# Patient Record
Sex: Female | Born: 1950 | Race: Black or African American | Hispanic: No | Marital: Single | Smoking: Former smoker
Health system: Southern US, Community
[De-identification: ages and names within clinical notes are randomized; demographics above are authoritative.]

## PROBLEM LIST (undated history)

## (undated) DIAGNOSIS — Z923 Personal history of irradiation: Secondary | ICD-10-CM

## (undated) DIAGNOSIS — R011 Cardiac murmur, unspecified: Secondary | ICD-10-CM

## (undated) DIAGNOSIS — I10 Essential (primary) hypertension: Secondary | ICD-10-CM

## (undated) DIAGNOSIS — M1990 Unspecified osteoarthritis, unspecified site: Secondary | ICD-10-CM

## (undated) DIAGNOSIS — I82611 Acute embolism and thrombosis of superficial veins of right upper extremity: Secondary | ICD-10-CM

## (undated) DIAGNOSIS — I7121 Aneurysm of the ascending aorta, without rupture: Secondary | ICD-10-CM

## (undated) DIAGNOSIS — M8580 Other specified disorders of bone density and structure, unspecified site: Secondary | ICD-10-CM

## (undated) DIAGNOSIS — I499 Cardiac arrhythmia, unspecified: Secondary | ICD-10-CM

## (undated) DIAGNOSIS — E782 Mixed hyperlipidemia: Secondary | ICD-10-CM

## (undated) DIAGNOSIS — M160 Bilateral primary osteoarthritis of hip: Secondary | ICD-10-CM

## (undated) HISTORY — PX: KNEE ARTHROSCOPY: SUR90

## (undated) HISTORY — PX: COLONOSCOPY: SHX174

## (undated) HISTORY — PX: CARPAL TUNNEL RELEASE: SHX101

## (undated) HISTORY — PX: ABDOMINAL HYSTERECTOMY: SHX81

---

## 2010-01-13 ENCOUNTER — Ambulatory Visit: Payer: Self-pay | Admitting: Endocrinology

## 2010-01-19 ENCOUNTER — Ambulatory Visit: Payer: Self-pay | Admitting: Gastroenterology

## 2010-12-26 ENCOUNTER — Ambulatory Visit: Payer: Self-pay | Admitting: Unknown Physician Specialty

## 2010-12-27 LAB — PATHOLOGY REPORT

## 2011-09-25 ENCOUNTER — Ambulatory Visit: Payer: Self-pay | Admitting: Endocrinology

## 2011-10-05 ENCOUNTER — Ambulatory Visit: Payer: Self-pay | Admitting: Endocrinology

## 2013-01-16 ENCOUNTER — Ambulatory Visit: Payer: Self-pay | Admitting: Family Medicine

## 2013-01-23 ENCOUNTER — Ambulatory Visit: Payer: Self-pay | Admitting: Family Medicine

## 2013-03-03 HISTORY — PX: BREAST BIOPSY: SHX20

## 2013-03-05 ENCOUNTER — Ambulatory Visit: Payer: Self-pay | Admitting: Surgery

## 2014-03-26 ENCOUNTER — Ambulatory Visit: Payer: Self-pay | Admitting: Family Medicine

## 2014-04-28 ENCOUNTER — Ambulatory Visit: Payer: BC Managed Care – PPO | Admitting: Podiatry

## 2014-04-30 ENCOUNTER — Ambulatory Visit (INDEPENDENT_AMBULATORY_CARE_PROVIDER_SITE_OTHER): Payer: BC Managed Care – PPO

## 2014-04-30 ENCOUNTER — Encounter: Payer: Self-pay | Admitting: Podiatry

## 2014-04-30 ENCOUNTER — Ambulatory Visit (INDEPENDENT_AMBULATORY_CARE_PROVIDER_SITE_OTHER): Payer: BC Managed Care – PPO | Admitting: Podiatry

## 2014-04-30 VITALS — BP 160/92 | HR 54 | Resp 16 | Ht 68.0 in | Wt 179.0 lb

## 2014-04-30 DIAGNOSIS — L84 Corns and callosities: Secondary | ICD-10-CM

## 2014-04-30 DIAGNOSIS — M2040 Other hammer toe(s) (acquired), unspecified foot: Secondary | ICD-10-CM

## 2014-04-30 DIAGNOSIS — M2010 Hallux valgus (acquired), unspecified foot: Secondary | ICD-10-CM

## 2014-04-30 DIAGNOSIS — G589 Mononeuropathy, unspecified: Secondary | ICD-10-CM

## 2014-04-30 DIAGNOSIS — G629 Polyneuropathy, unspecified: Secondary | ICD-10-CM

## 2014-04-30 NOTE — Progress Notes (Signed)
   Subjective:    Patient ID: Kim Ruiz, female    DOB: [birth year], 63 y.o.   MRN: [id number]  HPI Comments: My toes are numb and i have a callus on both feet. They both tingle and they are tender. This started back in June 2015. They are getting better. Everything makes them tender. i soak my feet in epsom salt and massage them.  Foot Pain      Review of Systems  All other systems reviewed and are negative.      Objective:   Physical Exam        Assessment & Plan:

## 2014-04-30 NOTE — Progress Notes (Signed)
Subjective:     Patient ID: Kim Ruiz, female   DOB: [birth year], 63 y.o.   MRN: [id number]  Foot Pain   patient states I been getting pain in both of my feet and it seems that most has started since I bought a new pair of steel toe shoes at [employer]. Also has developed lesions on the bottom of both feet the become sore and she is just concerned because she had nerve pain even though she states it is getting much better over the last couple months   Review of Systems  All other systems reviewed and are negative.      Objective:   Physical Exam  Nursing note and vitals reviewed. Constitutional: She is oriented to person, place, and time.  Cardiovascular: Intact distal pulses.   Musculoskeletal: Normal range of motion.  Neurological: She is oriented to person, place, and time.  Skin: Skin is warm.   neurovascular status intact with muscle strength adequate and range of motion of the subtalar and midtarsal joint within normal limits. Patient's found to have keratotic lesion on the fifth metatarsal head of both feet and the plantar the fifth metatarsal base both feet and the fifth toe of both feet. I checked her and found sharp [employer] vibratory to be intact with no changes and I noted her to be well oriented x3 with digits that are well-perfused     Assessment:     Possibility that she may have traumatized her feet secondary to ill fitting shoes or other issues associated with a new steel toe shoes that she had purchase    Plan:     H&P and x-rays reviewed. Today I debrided lesions on both feet and explained that it nerve symptoms were to get worse we may need to treat that but at this point I am going to just watch it and see if he gets better with new shoes

## 2014-05-14 ENCOUNTER — Ambulatory Visit (INDEPENDENT_AMBULATORY_CARE_PROVIDER_SITE_OTHER): Payer: BC Managed Care – PPO

## 2014-05-14 ENCOUNTER — Ambulatory Visit (INDEPENDENT_AMBULATORY_CARE_PROVIDER_SITE_OTHER): Payer: BC Managed Care – PPO | Admitting: Podiatry

## 2014-05-14 VITALS — BP 133/66 | HR 55 | Resp 16

## 2014-05-14 DIAGNOSIS — S9031XA Contusion of right foot, initial encounter: Secondary | ICD-10-CM

## 2014-05-14 DIAGNOSIS — S9030XA Contusion of unspecified foot, initial encounter: Secondary | ICD-10-CM

## 2014-05-14 DIAGNOSIS — L02619 Cutaneous abscess of unspecified foot: Secondary | ICD-10-CM

## 2014-05-14 DIAGNOSIS — L03119 Cellulitis of unspecified part of limb: Secondary | ICD-10-CM

## 2014-05-14 MED ORDER — CEPHALEXIN 500 MG PO CAPS: 500.0000 mg | ORAL_CAPSULE | Freq: Three times a day (TID) | ORAL | Status: DC

## 2014-05-14 NOTE — Progress Notes (Signed)
Subjective:     Patient ID: Kim Ruiz, female   DOB: 09/04/1950, 63 y.o.   MRN: [id number]  HPI patient states that a week ago a screen door landed on her right foot and penetrated the top and it has been sore since that   Review of Systems     Objective:   Physical Exam Neurovascular status intact with a small area of breakdown tissue dorsal lateral aspect right foot that is localized with no odor no proximal edema erythema or drainage noted with a small amount of localized drainage and crusting    Assessment:     Trauma to the right dorsal foot with localized breakdown of tissue with no proximal indications of infection    Plan:     H&P and x-rays reviewed and today I cleaned the area up flushed and applied a small amount of Iodosorb to dry it out was sterile dressing. Instructed on soaks and topical antibiotic treatment for the area and as precautionary measure placed her on cephalexin 500 mg 3 times a day for 10 days. Gave instructions if any proximal edema erythema or any systemic signs of infection were to occur she is to go straight to the emergency room and contact us

## 2014-06-22 ENCOUNTER — Ambulatory Visit: Payer: Self-pay | Admitting: Internal Medicine

## 2014-11-23 ENCOUNTER — Ambulatory Visit: Payer: Self-pay | Admitting: Specialist

## 2014-11-23 DIAGNOSIS — R011 Cardiac murmur, unspecified: Secondary | ICD-10-CM

## 2014-11-23 DIAGNOSIS — I10 Essential (primary) hypertension: Secondary | ICD-10-CM | POA: Diagnosis not present

## 2015-01-02 NOTE — Op Note (Signed)
PATIENT NAME:  Kim Ruiz, Kim Ruiz MR#:  [id number] DATE OF BIRTH:  05-29-1951  DATE OF PROCEDURE:  11/23/2014  PREOPERATIVE DIAGNOSIS: Medial meniscus tear, right knee.   POSTOPERATIVE DIAGNOSIS:   Medial meniscus tear, right knee.   PROCEDURE: Arthroscopic partial medial meniscectomy, right knee.   SURGEON:  Christophe Louis, MD   ANESTHESIA: General.   COMPLICATIONS: None.   PROCEDURE: After adequate induction of general anesthesia, the right lower extremity is secured in the legholder in the usual manner for arthroscopy. The right knee and leg are thoroughly prepped with alcohol and ChloraPrep and draped in standard sterile fashion. The joint is infiltrated with 10 mL of Marcaine with epinephrine. Diagnostic arthroscopy is performed. There is seen to be mild chondromalacia of the patellofemoral joint. The lateral compartment is normal with the lateral meniscus which is normal and normal cartilage surfaces.   In the intercondylar notch, there is mild increased synovitis. The anterior cruciate ligament is normal. In the medial compartment, there is a buckle-handle-type tear of the mid and posterior horns of the medial meniscus. This is associated with a moderate amount of chondromalacia associated to that area on the medial femoral condyle. Using the basket forceps, the full radial resector and the ArthroWand, the torn portion of the meniscus was resected back to a full stable rim. The wound was thoroughly irrigated of all debris. Portals were closed with 4-0 nylon. The joint is infiltrated with 15 mL of Marcaine with epinephrine and 4 mg of morphine. A soft bulky dressing is applied. The patient is returned to the recovery room in satisfactory condition having tolerated the procedure quite well.    ____________________________ Lucas Mallow, MD ces:tr D: 11/23/2014 11:28:15 ET T: 11/23/2014 13:18:49 ET JOB#: [id number]  cc: Lucas Mallow, MD, <Dictator> Lucas Mallow  MD ELECTRONICALLY SIGNED 11/27/2014 13:03

## 2015-01-10 ENCOUNTER — Other Ambulatory Visit: Payer: Self-pay | Admitting: Specialist

## 2015-01-10 DIAGNOSIS — S83221D Peripheral tear of medial meniscus, current injury, right knee, subsequent encounter: Secondary | ICD-10-CM

## 2015-01-15 ENCOUNTER — Ambulatory Visit
Admission: RE | Admit: 2015-01-15 | Discharge: 2015-01-15 | Disposition: A | Discharge status: Home / Self Care | Payer: BLUE CROSS/BLUE SHIELD | Source: Ambulatory Visit | Attending: Specialist | Admitting: Specialist

## 2015-01-15 DIAGNOSIS — X58XXXA Exposure to other specified factors, initial encounter: Secondary | ICD-10-CM | POA: Insufficient documentation

## 2015-01-15 DIAGNOSIS — S83241A Other tear of medial meniscus, current injury, right knee, initial encounter: Secondary | ICD-10-CM | POA: Insufficient documentation

## 2015-01-15 DIAGNOSIS — M25461 Effusion, right knee: Secondary | ICD-10-CM | POA: Insufficient documentation

## 2015-01-15 DIAGNOSIS — M1711 Unilateral primary osteoarthritis, right knee: Secondary | ICD-10-CM | POA: Diagnosis not present

## 2015-01-15 DIAGNOSIS — S83221D Peripheral tear of medial meniscus, current injury, right knee, subsequent encounter: Secondary | ICD-10-CM

## 2015-01-28 ENCOUNTER — Encounter: Payer: Self-pay | Admitting: *Deleted

## 2015-01-28 ENCOUNTER — Encounter: Payer: Self-pay | Admitting: Podiatry

## 2015-01-28 ENCOUNTER — Other Ambulatory Visit: Payer: Self-pay

## 2015-01-28 DIAGNOSIS — S83241A Other tear of medial meniscus, current injury, right knee, initial encounter: Secondary | ICD-10-CM | POA: Diagnosis not present

## 2015-01-28 DIAGNOSIS — M67261 Synovial hypertrophy, not elsewhere classified, right lower leg: Secondary | ICD-10-CM | POA: Diagnosis present

## 2015-01-28 DIAGNOSIS — Z79899 Other long term (current) drug therapy: Secondary | ICD-10-CM | POA: Diagnosis not present

## 2015-01-28 DIAGNOSIS — Z79891 Long term (current) use of opiate analgesic: Secondary | ICD-10-CM | POA: Diagnosis not present

## 2015-01-28 MED ORDER — BUPIVACAINE-EPINEPHRINE (PF) 0.5% -1:200000 IJ SOLN
INTRAMUSCULAR | Status: AC
  Filled 2015-01-28: qty 30

## 2015-01-28 NOTE — Patient Instructions (Signed)
  Your procedure is scheduled on: 02-01-15 Report to [facility] To find out your arrival time please call [phone] between 1PM - 3PM on 01-28-15  Remember: Instructions that are not followed completely may result in serious medical risk, up to and including death, or upon the discretion of your surgeon and anesthesiologist your surgery may need to be rescheduled.    _X___ 1. Do not eat food or drink liquids after midnight. No gum chewing or hard candies.     _X___ 2. No Alcohol for 24 hours before or after surgery.   ____ 3. Bring all medications with you on the day of surgery if instructed.    ____ 4. Notify your doctor if there is any change in your medical condition     (cold, fever, infections).     Do not wear jewelry, make-up, hairpins, clips or nail polish.  Do not wear lotions, powders, or perfumes. You may wear deodorant.  Do not shave 48 hours prior to surgery. Men may shave face and neck.  Do not bring valuables to the hospital.    [facility] is not responsible for any belongings or valuables.               Contacts, dentures or bridgework may not be worn into surgery.  Leave your suitcase in the car. After surgery it may be brought to your room.  For patients admitted to the hospital, discharge time is determined by your  treatment team.   Patients discharged the day of surgery will not be allowed to drive home.   Please read over the following fact sheets that you were given:      ____ Take these medicines the morning of surgery with A SIP OF WATER:    1. NONE  2.   3.   4.  5.  6.  ____ Fleet Enema (as directed)   ____ Use CHG Soap as directed  ____ Use inhalers on the day of surgery  ____ Stop metformin 2 days prior to surgery    ____ Take 1/2 of usual insulin dose the night before surgery and none on the morning of surgery.   ____ Stop Coumadin/Plavix/aspirin N/A  ____ Stop Anti-inflammatories-STOP MELOXICAM AND  ALEVE NOW-NO NSAIDS OR ASA PRODUCTS-TYLENOL OK   ____ Stop supplements until after surgery.    ____ Bring C-Pap to the hospital.

## 2015-02-01 ENCOUNTER — Ambulatory Visit: Payer: BLUE CROSS/BLUE SHIELD | Admitting: Certified Registered"

## 2015-02-01 ENCOUNTER — Encounter: Payer: Self-pay | Admitting: *Deleted

## 2015-02-01 ENCOUNTER — Encounter
Admission: RE | Disposition: A | Discharge status: Home / Self Care | Payer: Self-pay | Source: Ambulatory Visit | Attending: Specialist

## 2015-02-01 ENCOUNTER — Ambulatory Visit
Admission: RE | Admit: 2015-02-01 | Discharge: 2015-02-01 | Disposition: A | Discharge status: Home / Self Care | Payer: BLUE CROSS/BLUE SHIELD | Source: Ambulatory Visit | Attending: Specialist | Admitting: Specialist

## 2015-02-01 DIAGNOSIS — M67261 Synovial hypertrophy, not elsewhere classified, right lower leg: Secondary | ICD-10-CM | POA: Insufficient documentation

## 2015-02-01 DIAGNOSIS — Z79899 Other long term (current) drug therapy: Secondary | ICD-10-CM | POA: Insufficient documentation

## 2015-02-01 DIAGNOSIS — S83241A Other tear of medial meniscus, current injury, right knee, initial encounter: Secondary | ICD-10-CM | POA: Diagnosis not present

## 2015-02-01 DIAGNOSIS — Z79891 Long term (current) use of opiate analgesic: Secondary | ICD-10-CM | POA: Insufficient documentation

## 2015-02-01 HISTORY — PX: KNEE ARTHROSCOPY WITH MEDIAL MENISECTOMY: SHX5651

## 2015-02-01 HISTORY — DX: Cardiac arrhythmia, unspecified: I49.9

## 2015-02-01 HISTORY — DX: Essential (primary) hypertension: I10

## 2015-02-01 HISTORY — DX: Cardiac murmur, unspecified: R01.1

## 2015-02-01 LAB — POTASSIUM: Potassium, serum: 3.8

## 2015-02-01 SURGERY — ARTHROSCOPY, KNEE, WITH MEDIAL MENISCECTOMY
Anesthesia: General | Site: Knee | Laterality: Right | Wound class: Clean

## 2015-02-01 MED ORDER — FENTANYL CITRATE (PF) 100 MCG/2ML IJ SOLN
25.0000 ug | INTRAMUSCULAR | Status: DC | PRN
  Administered 2015-02-01 (×2): 25 ug via INTRAVENOUS

## 2015-02-01 MED ORDER — HYDROMORPHONE HCL 2 MG PO TABS
1.0000 mg | ORAL_TABLET | ORAL | Status: DC | PRN
  Administered 2015-02-01: 2 mg via ORAL
  Filled 2015-02-01: qty 1

## 2015-02-01 MED ORDER — LACTATED RINGERS IR SOLN
Status: DC | PRN
  Administered 2015-02-01: 12000 mL

## 2015-02-01 MED ORDER — HYDROMORPHONE HCL 2 MG PO TABS
ORAL_TABLET | ORAL | Status: AC
  Administered 2015-02-01: 2 mg via ORAL
  Filled 2015-02-01: qty 1

## 2015-02-01 MED ORDER — LIDOCAINE HCL (CARDIAC) 20 MG/ML IV SOLN
INTRAVENOUS | Status: DC | PRN
  Administered 2015-02-01: 80 mg via INTRAVENOUS

## 2015-02-01 MED ORDER — METHYLPREDNISOLONE ACETATE 40 MG/ML IJ SUSP
INTRAMUSCULAR | Status: AC
  Filled 2015-02-01: qty 1

## 2015-02-01 MED ORDER — CEFAZOLIN SODIUM-DEXTROSE 2-3 GM-% IV SOLR
2.0000 g | Freq: Once | INTRAVENOUS | Status: AC
  Administered 2015-02-01: 2 g via INTRAVENOUS

## 2015-02-01 MED ORDER — METHYLPREDNISOLONE ACETATE 40 MG/ML IJ SUSP
INTRAMUSCULAR | Status: DC | PRN
  Administered 2015-02-01: 40 mg

## 2015-02-01 MED ORDER — DEXAMETHASONE SODIUM PHOSPHATE 4 MG/ML IJ SOLN
INTRAMUSCULAR | Status: DC | PRN
  Administered 2015-02-01: 5 mg via INTRAVENOUS

## 2015-02-01 MED ORDER — HYDROMORPHONE HCL 2 MG PO TABS
2.0000 mg | ORAL_TABLET | ORAL | Status: DC | PRN
Start: 2015-02-01 — End: 2023-06-25

## 2015-02-01 MED ORDER — MIDAZOLAM HCL 2 MG/2ML IJ SOLN
INTRAMUSCULAR | Status: DC | PRN
  Administered 2015-02-01: 2 mg via INTRAVENOUS

## 2015-02-01 MED ORDER — FENTANYL CITRATE (PF) 100 MCG/2ML IJ SOLN
INTRAMUSCULAR | Status: AC
  Filled 2015-02-01: qty 2

## 2015-02-01 MED ORDER — BUPIVACAINE-EPINEPHRINE (PF) 0.5% -1:200000 IJ SOLN
INTRAMUSCULAR | Status: AC
  Filled 2015-02-01: qty 30

## 2015-02-01 MED ORDER — MORPHINE SULFATE (PF) 4 MG/ML IV SOLN
INTRAVENOUS | Status: DC | PRN
  Administered 2015-02-01: 4 mg via SURGICAL_CAVITY

## 2015-02-01 MED ORDER — FAMOTIDINE 20 MG PO TABS
20.0000 mg | ORAL_TABLET | Freq: Once | ORAL | Status: AC
  Administered 2015-02-01: 20 mg via ORAL

## 2015-02-01 MED ORDER — MORPHINE SULFATE 4 MG/ML IJ SOLN
INTRAMUSCULAR | Status: AC
  Filled 2015-02-01: qty 1

## 2015-02-01 MED ORDER — CEFAZOLIN SODIUM-DEXTROSE 2-3 GM-% IV SOLR
INTRAVENOUS | Status: AC
  Filled 2015-02-01: qty 50

## 2015-02-01 MED ORDER — ONDANSETRON HCL 4 MG/2ML IJ SOLN
INTRAMUSCULAR | Status: DC | PRN
  Administered 2015-02-01: 4 mg via INTRAVENOUS

## 2015-02-01 MED ORDER — FAMOTIDINE 20 MG PO TABS
ORAL_TABLET | ORAL | Status: AC
  Filled 2015-02-01: qty 1

## 2015-02-01 MED ORDER — HYDRALAZINE HCL 20 MG/ML IJ SOLN
INTRAMUSCULAR | Status: AC
  Filled 2015-02-01: qty 1

## 2015-02-01 MED ORDER — ONDANSETRON HCL 4 MG/2ML IJ SOLN
4.0000 mg | Freq: Once | INTRAMUSCULAR | Status: DC | PRN
Start: 2015-02-01 — End: 2015-02-01

## 2015-02-01 MED ORDER — LACTATED RINGERS IV SOLN
INTRAVENOUS | Status: DC | PRN
  Administered 2015-02-01 (×2): via INTRAVENOUS

## 2015-02-01 MED ORDER — GLYCOPYRROLATE 0.2 MG/ML IJ SOLN
INTRAMUSCULAR | Status: DC | PRN
  Administered 2015-02-01: 0.2 mg via INTRAVENOUS

## 2015-02-01 MED ORDER — CHLORHEXIDINE GLUCONATE 4 % EX LIQD: Freq: Once | CUTANEOUS | Status: DC

## 2015-02-01 MED ORDER — PROPOFOL 10 MG/ML IV BOLUS
INTRAVENOUS | Status: DC | PRN
  Administered 2015-02-01: 160 mg via INTRAVENOUS
  Administered 2015-02-01: 50 mg via INTRAVENOUS
  Administered 2015-02-01: 40 mg via INTRAVENOUS

## 2015-02-01 MED ORDER — LABETALOL HCL 5 MG/ML IV SOLN
INTRAVENOUS | Status: DC | PRN
  Administered 2015-02-01: 5 mg via INTRAVENOUS

## 2015-02-01 MED ORDER — LACTATED RINGERS IV SOLN
Freq: Once | INTRAVENOUS | Status: AC
  Administered 2015-02-01: 06:00:00 via INTRAVENOUS

## 2015-02-01 MED ORDER — EPHEDRINE SULFATE 50 MG/ML IJ SOLN
INTRAMUSCULAR | Status: DC | PRN
  Administered 2015-02-01: 10 mg via INTRAVENOUS

## 2015-02-01 MED ORDER — BUPIVACAINE-EPINEPHRINE (PF) 0.5% -1:200000 IJ SOLN
INTRAMUSCULAR | Status: DC | PRN
  Administered 2015-02-01: 30 mL

## 2015-02-01 MED ORDER — FENTANYL CITRATE (PF) 100 MCG/2ML IJ SOLN
INTRAMUSCULAR | Status: DC | PRN
  Administered 2015-02-01 (×4): 50 ug via INTRAVENOUS

## 2015-02-01 MED ORDER — PHENYLEPHRINE HCL 10 MG/ML IJ SOLN
INTRAMUSCULAR | Status: DC | PRN
  Administered 2015-02-01: 100 ug via INTRAVENOUS

## 2015-02-01 MED FILL — Morphine Sulfate Inj 4 MG/ML: INTRAMUSCULAR | Qty: 1 | Status: AC

## 2015-02-01 SURGICAL SUPPLY — 18 items
BANDAGE ELASTIC 6 CLIP NS LF (GAUZE/BANDAGES/DRESSINGS) ×2 IMPLANT
BLADE AGGRESSIVE PLUS 4.0 (BLADE) ×2 IMPLANT
BUR RADIUS 3.5 (BURR) ×2 IMPLANT
CHLORAPREP W/TINT 26ML (MISCELLANEOUS) ×2 IMPLANT
GAUZE SPONGE 4X4 12PLY STRL (GAUZE/BANDAGES/DRESSINGS) ×2 IMPLANT
GLOVE BIO SURGEON STRL SZ7.5 (GLOVE) ×2 IMPLANT
GOWN STRL REUS W/ TWL LRG LVL3 (GOWN DISPOSABLE) ×2 IMPLANT
GOWN STRL REUS W/TWL LRG LVL3 (GOWN DISPOSABLE) ×2
IV LACTATED RINGER IRRG 3000ML (IV SOLUTION) ×6
IV LR IRRIG 3000ML ARTHROMATIC (IV SOLUTION) ×6 IMPLANT
MANIFOLD NEPTUNE II (INSTRUMENTS) ×2 IMPLANT
PACK ARTHROSCOPY KNEE (MISCELLANEOUS) ×2 IMPLANT
SET TUBE SUCT SHAVER OUTFL 24K (TUBING) ×2 IMPLANT
SET TUBE TIP INTRA-ARTICULAR (MISCELLANEOUS) ×2 IMPLANT
STRAP SAFETY BODY (MISCELLANEOUS) ×2 IMPLANT
SUT ETHILON 5-0 FS-2 18 BLK (SUTURE) ×2 IMPLANT
TUBING ARTHRO INFLOW-ONLY STRL (TUBING) ×2 IMPLANT
WAND HAND CNTRL MULTIVAC 50 (MISCELLANEOUS) ×2 IMPLANT

## 2015-02-01 NOTE — Anesthesia Preprocedure Evaluation (Signed)
Anesthesia Evaluation  Patient identified by MRN, date of birth, ID band Patient awake    Reviewed: Allergy & Precautions, NPO status , Patient's Chart, lab work & pertinent test results  Airway Mallampati: II  TM Distance: >3 FB Neck ROM: Full    Dental  (+) Upper Dentures, Lower Dentures   Pulmonary former smoker,    Pulmonary exam normal       Cardiovascular hypertension, Normal cardiovascular exam+ dysrhythmias + Valvular Problems/Murmurs     Neuro/Psych negative neurological ROS  negative psych ROS   GI/Hepatic negative GI ROS, Neg liver ROS,   Endo/Other  negative endocrine ROS  Renal/GU negative Renal ROS  negative genitourinary   Musculoskeletal negative musculoskeletal ROS (+)   Abdominal Normal abdominal exam  (+)   Peds  Hematology negative hematology ROS (+)   Anesthesia Other Findings   Reproductive/Obstetrics negative OB ROS                             Anesthesia Physical Anesthesia Plan  ASA: II  Anesthesia Plan: General   Post-op Pain Management:    Induction: Intravenous  Airway Management Planned: LMA  Additional Equipment:   Intra-op Plan:   Post-operative Plan: Extubation in OR  Informed Consent: I have reviewed the patients History and Physical, chart, labs and discussed the procedure including the risks, benefits and alternatives for the proposed anesthesia with the patient or authorized representative who has indicated his/her understanding and acceptance.   Dental advisory given  Plan Discussed with: CRNA and Surgeon  Anesthesia Plan Comments:         Anesthesia Quick Evaluation

## 2015-02-01 NOTE — Anesthesia Postprocedure Evaluation (Signed)
  Anesthesia Post-op Note  Patient: Kim Ruiz  Procedure(s) Performed: Procedure(s): KNEE ARTHROSCOPY WITH MEDIAL MENISECTOMY (Right)  Anesthesia type:General  Patient location: PACU  Post pain: Pain level controlled  Post assessment: Post-op Vital signs reviewed, Patient's Cardiovascular Status Stable, Respiratory Function Stable, Patent Airway and No signs of Nausea or vomiting  Post vital signs: Reviewed and stable  Last Vitals:  Filed Vitals:   02/01/15 1100  BP: 140/76  Pulse: 57  Temp: 36.3 C  Resp: 16    Level of consciousness: awake, alert  and patient cooperative  Complications: No apparent anesthesia complications

## 2015-02-01 NOTE — H&P (Signed)
  64 year old female with possible recurrent medial meniscus tear right knee.  Heart and lungs clear  ENT normal  Complete H and P as per attached document from office in chart  Plan: arthroscopic debridement right knee.

## 2015-02-01 NOTE — Brief Op Note (Signed)
02/01/2015  9:04 AM  PATIENT:  Kim Ruiz  64 y.o. 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   Delay start of Pharmacological VTE agent (>24hrs) due to surgical blood loss or risk of bleeding: not applicable

## 2015-02-01 NOTE — Transfer of Care (Signed)
Immediate Anesthesia Transfer of Care Note  Patient: Kim Ruiz  Procedure(s) Performed: Procedure(s): KNEE ARTHROSCOPY WITH MEDIAL MENISECTOMY (Right)  Patient Location: PACU  Anesthesia Type:General  Level of Consciousness: awake, alert  and oriented  Airway & Oxygen Therapy: Patient Spontanous Breathing and Patient connected to face mask oxygen  Post-op Assessment: Report given to RN, Post -op Vital signs reviewed and stable and Patient moving all extremities X 4  Post vital signs: Reviewed and stable  Last Vitals:  Filed Vitals:   02/01/15 0905  BP: 108/73  Pulse: 65  Temp: 36.5 C  Resp: 13    Complications: No apparent anesthesia complications

## 2015-02-01 NOTE — Discharge Instructions (Signed)
Partial weight bearing with crutches  Begin bending knee immediately as far as possible despite pain, and straighten it out completely  Remove entire dressing in 24 hours, may bathe, get wet, cover wounds with Bandaids   AMBULATORY SURGERY   DISCHARGE INSTRUCTIONS   1) The drugs that you were given will stay in your system until tomorrow so for the next 24 hours you should not:  A) Drive an automobile B) Make any legal decisions C) Drink any alcoholic beverage   2) You may resume regular meals tomorrow.  Today it is better to start with liquids and gradually work up to solid foods.  You may eat anything you prefer, but it is better to start with liquids, then soup and crackers, and gradually work up to solid foods.   3) Please notify your doctor immediately if you have any unusual bleeding, trouble breathing, redness and pain at the surgery site, drainage, fever, or pain not relieved by medication.                 4) Additional Instructions:   [facility] Number [phone]

## 2015-02-01 NOTE — Op Note (Signed)
NAMEMarland Kitchen  LEVETTE, PAULICK NO.:  [id number]  MEDICAL RECORD NO.:  [id number]  LOCATION:  PERIO                        FACILITY:  [facility]  PHYSICIAN:  Margaretmary Eddy, MD        DATE OF BIRTH:  1951/03/01  DATE OF PROCEDURE:  02/01/2015 DATE OF DISCHARGE:                              OPERATIVE REPORT   PREOPERATIVE DIAGNOSIS:  Recurrent medial meniscus tear, right knee.  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  PROCEDURE:  Arthroscopic major synovectomy, right knee.  ANESTHESIA:  General.  COMPLICATIONS:  None.  DESCRIPTION OF PROCEDURE:  Ancef 2 g is given intravenously prior to the procedure.  General anesthesia is induced.  The right lower extremity is placed in a leg holder in the usual manner for arthroscopy.  The joint infiltrated first with 0.5% Marcaine with epinephrine.  Diagnostic arthroscopy is performed.  There is seen to be severe arthrofibrosis with scar tissue about the entire knee and especially in the suprapatellar pouch.  There is no evidence of infection.  Manipulation prior to the procedure demonstrates 5 degrees to approximately 75 degrees.  The arthroscope is inserted, along with the full radial resector and the ArthroWand and scar tissue is completely resected from the suprapatellar pouch and the gutters on each side and also the intracondylar notch.  The ArthroWand is used to maintain complete hemostasis at all times and at the finish of the procedure.  The lateral meniscus is normal.  The medial compartment demonstrates degenerative chondromalacia on the articular surfaces, but there is no residual meniscus tear.  The joint is thoroughly irrigated multiple times.  At the end of the procedure, the portals are closed with 5-0 nylon.  The joint is infiltrated with 10 cc of Marcaine with epinephrine, 4 mg of morphine and 40 mg of Depo-Medrol.  At the close of the procedure, the  knee can only be manipulated into full extension and down to 95 degrees of flexion.  A soft bulky dressing is applied.  The patient is returned to the recovery room in satisfactory condition, having tolerated the procedure quite well.          ______________________________ Margaretmary Eddy, MD     CS/MEDQ  D:  02/01/2015  T:  02/01/2015  Job:  [id number]

## 2015-02-01 NOTE — Anesthesia Procedure Notes (Signed)
Procedure Name: LMA Insertion Date/Time: 02/01/2015 7:40 AM Performed by: Silvana Newness Pre-anesthesia Checklist: Patient identified, Emergency Drugs available, Suction available, Patient being monitored and Timeout performed Patient Re-evaluated:Patient Re-evaluated prior to inductionOxygen Delivery Method: Circle system utilized Preoxygenation: Pre-oxygenation with 100% oxygen Intubation Type: IV induction Ventilation: Mask ventilation without difficulty LMA: LMA inserted LMA Size: 3.5 Number of attempts: 1 Tube secured with: Tape Dental Injury: Teeth and Oropharynx as per pre-operative assessment

## 2015-03-01 ENCOUNTER — Ambulatory Visit: Payer: BLUE CROSS/BLUE SHIELD | Admitting: Anesthesiology

## 2015-03-01 ENCOUNTER — Encounter: Payer: Self-pay | Admitting: *Deleted

## 2015-03-01 ENCOUNTER — Encounter
Admission: RE | Disposition: A | Discharge status: Home / Self Care | Payer: Self-pay | Source: Ambulatory Visit | Attending: Gastroenterology

## 2015-03-01 ENCOUNTER — Ambulatory Visit
Admission: RE | Admit: 2015-03-01 | Discharge: 2015-03-01 | Disposition: A | Discharge status: Home / Self Care | Payer: BLUE CROSS/BLUE SHIELD | Source: Ambulatory Visit | Attending: Gastroenterology | Admitting: Gastroenterology

## 2015-03-01 DIAGNOSIS — Z87891 Personal history of nicotine dependence: Secondary | ICD-10-CM | POA: Insufficient documentation

## 2015-03-01 DIAGNOSIS — R011 Cardiac murmur, unspecified: Secondary | ICD-10-CM | POA: Diagnosis not present

## 2015-03-01 DIAGNOSIS — Z1211 Encounter for screening for malignant neoplasm of colon: Secondary | ICD-10-CM | POA: Insufficient documentation

## 2015-03-01 DIAGNOSIS — D125 Benign neoplasm of sigmoid colon: Secondary | ICD-10-CM

## 2015-03-01 DIAGNOSIS — I10 Essential (primary) hypertension: Secondary | ICD-10-CM | POA: Diagnosis not present

## 2015-03-01 DIAGNOSIS — K641 Second degree hemorrhoids: Secondary | ICD-10-CM | POA: Diagnosis not present

## 2015-03-01 DIAGNOSIS — D122 Benign neoplasm of ascending colon: Secondary | ICD-10-CM | POA: Diagnosis not present

## 2015-03-01 DIAGNOSIS — Z79899 Other long term (current) drug therapy: Secondary | ICD-10-CM | POA: Diagnosis not present

## 2015-03-01 HISTORY — PX: COLONOSCOPY WITH PROPOFOL: SHX5780

## 2015-03-01 SURGERY — COLONOSCOPY WITH PROPOFOL
Anesthesia: General

## 2015-03-01 MED ORDER — LACTATED RINGERS IV SOLN
INTRAVENOUS | Status: DC | PRN
  Administered 2015-03-01: 11:00:00 via INTRAVENOUS

## 2015-03-01 MED ORDER — FENTANYL CITRATE (PF) 100 MCG/2ML IJ SOLN
INTRAMUSCULAR | Status: DC | PRN
  Administered 2015-03-01: 50 ug via INTRAVENOUS

## 2015-03-01 MED ORDER — MIDAZOLAM HCL 2 MG/2ML IJ SOLN
INTRAMUSCULAR | Status: DC | PRN
Start: 2015-03-01 — End: 2015-03-01
  Administered 2015-03-01: 1 mg via INTRAVENOUS

## 2015-03-01 MED ORDER — ONDANSETRON HCL 4 MG/2ML IJ SOLN: 4.0000 mg | Freq: Once | INTRAMUSCULAR | Status: DC | PRN

## 2015-03-01 MED ORDER — SODIUM CHLORIDE 0.9 % IR SOLN
1000.0000 mL | Freq: Once | Status: AC
  Administered 2015-03-01: 1000 mL

## 2015-03-01 MED ORDER — PROPOFOL INFUSION 10 MG/ML OPTIME
INTRAVENOUS | Status: DC | PRN
  Administered 2015-03-01: 150 ug/kg/min via INTRAVENOUS

## 2015-03-01 MED ORDER — FENTANYL CITRATE (PF) 100 MCG/2ML IJ SOLN: 25.0000 ug | INTRAMUSCULAR | Status: DC | PRN

## 2015-03-01 NOTE — Op Note (Signed)
[facility] Patient Name: Kim Ruiz Procedure Date: 03/01/2015 10:35 AM MRN: [id number] Account #: [id number] Date of Birth: 06-13-1951 Admit Type: Outpatient Age: 64 Room: [facility] ENDO ROOM 4 Gender: Female Note Status: Finalized Procedure:         Colonoscopy Indications:       Screening for colorectal malignant neoplasm Providers:         Lucilla Lame, MD Referring MD:      Raye Sorrow, MD (Referring MD) Medicines:         Propofol per Anesthesia Complications:     No immediate complications. Procedure:         Pre-Anesthesia Assessment:                    - Prior to the procedure, a History and Physical was                     performed, and patient medications and allergies were                     reviewed. The patient's tolerance of previous anesthesia                     was also reviewed. The risks and benefits of the procedure                     and the sedation options and risks were discussed with the                     patient. All questions were answered, and informed consent                     was obtained. Prior Anticoagulants: The patient has taken                     no previous anticoagulant or antiplatelet agents. ASA                     Grade Assessment: II - A patient with mild systemic                     disease. After reviewing the risks and benefits, the                     patient was deemed in satisfactory condition to undergo                     the procedure.                    After obtaining informed consent, the colonoscope was                     passed under direct vision. Throughout the procedure, the                     patient's blood pressure, pulse, and oxygen saturations                     were monitored continuously. The Olympus CF-Q160AL                     colonoscope (S#. [id number]) was introduced through the anus  and advanced to the the cecum, identified by appendiceal              orifice and ileocecal valve. The colonoscopy was performed                     without difficulty. The patient tolerated the procedure                     well. The quality of the bowel preparation was excellent. Findings:      The perianal and digital rectal examinations were normal.      A 5 mm polyp was found in the sigmoid colon. The polyp was sessile. The       polyp was removed with a cold biopsy forceps. Resection and retrieval       were complete.      A 4 mm polyp was found in the ascending colon. The polyp was sessile.       The polyp was removed with a cold biopsy forceps. Resection and       retrieval were complete.      Non-bleeding internal hemorrhoids were found during retroflexion. The       hemorrhoids were Grade II (internal hemorrhoids that prolapse but reduce       spontaneously). Impression:        - One 5 mm polyp in the sigmoid colon. Resected and                     retrieved.                    - One 4 mm polyp in the ascending colon. Resected and                     retrieved.                    - Non-bleeding internal hemorrhoids. Recommendation:    - Await pathology results.                    - Repeat colonoscopy in 5 years if polyp adenoma and 10                     years if hyperplastic Procedure Code(s): --- Professional ---                    [phone], Colonoscopy, flexible; with biopsy, single or                     multiple Diagnosis Code(s): --- Professional ---                    Z12.11, Encounter for screening for malignant neoplasm of                     colon                    D12.5, Benign neoplasm of sigmoid colon                    D12.2, Benign neoplasm of ascending colon CPT copyright 2014 American Medical Association. All rights reserved. The codes documented in this report are preliminary and upon coder review may  be revised to meet current compliance requirements. Lucilla Lame, MD 03/01/2015 10:58:40 AM This report has been  signed electronically. Number  of Addenda: 0 Note Initiated On: 03/01/2015 10:35 AM Scope Withdrawal Time: 0 hours 9 minutes 32 seconds  Total Procedure Duration: 0 hours 13 minutes 33 seconds       [facility]

## 2015-03-01 NOTE — Transfer of Care (Signed)
Immediate Anesthesia Transfer of Care Note  Patient: Kim Ruiz  Procedure(s) Performed: Procedure(s): COLONOSCOPY WITH PROPOFOL (N/A)  Patient Location: PACU  Anesthesia Type:General  Level of Consciousness: sedated  Airway & Oxygen Therapy: Patient Spontanous Breathing and Patient connected to nasal cannula oxygen  Post-op Assessment: Report given to RN and Post -op Vital signs reviewed and stable  Post vital signs: Reviewed and stable  Last Vitals:  Filed Vitals:   03/01/15 1007  BP: 130/71  Pulse: 69  Temp: 35.7 C  Resp: 16    Complications: No apparent anesthesia complications

## 2015-03-01 NOTE — Anesthesia Postprocedure Evaluation (Signed)
  Anesthesia Post-op Note  Patient: Kim Ruiz  Procedure(s) Performed: Procedure(s): COLONOSCOPY WITH PROPOFOL (N/A)  Anesthesia type:General  Patient location: PACU  Post pain: Pain level controlled  Post assessment: Post-op Vital signs reviewed, Patient's Cardiovascular Status Stable, Respiratory Function Stable, Patent Airway and No signs of Nausea or vomiting  Post vital signs: Reviewed and stable  Last Vitals:  Filed Vitals:   03/01/15 1133  BP: 148/67  Pulse: 57  Temp:   Resp: 12    Level of consciousness: awake, alert  and patient cooperative  Complications: No apparent anesthesia complications

## 2015-03-01 NOTE — Anesthesia Preprocedure Evaluation (Addendum)
Anesthesia Evaluation  Patient identified by MRN, date of birth, ID band Patient awake    Reviewed: Allergy & Precautions, NPO status , Patient's Chart, lab work & pertinent test results  Airway Mallampati: II  TM Distance: >3 FB Neck ROM: Full    Dental  (+) Upper Dentures, Lower Dentures   Pulmonary former smoker,  breath sounds clear to auscultation  Pulmonary exam normal       Cardiovascular hypertension, Normal cardiovascular exam+ dysrhythmias + Valvular Problems/Murmurs     Neuro/Psych negative neurological ROS  negative psych ROS   GI/Hepatic negative GI ROS, Neg liver ROS,   Endo/Other  negative endocrine ROS  Renal/GU negative Renal ROS  negative genitourinary   Musculoskeletal negative musculoskeletal ROS (+)   Abdominal Normal abdominal exam  (+)   Peds negative pediatric ROS (+)  Hematology negative hematology ROS (+)   Anesthesia Other Findings   Reproductive/Obstetrics                            Anesthesia Physical Anesthesia Plan  ASA: II  Anesthesia Plan: General   Post-op Pain Management:    Induction: Intravenous  Airway Management Planned: Nasal Cannula  Additional Equipment:   Intra-op Plan:   Post-operative Plan:   Informed Consent: I have reviewed the patients History and Physical, chart, labs and discussed the procedure including the risks, benefits and alternatives for the proposed anesthesia with the patient or authorized representative who has indicated his/her understanding and acceptance.   Dental advisory given  Plan Discussed with: CRNA and Surgeon  Anesthesia Plan Comments:         Anesthesia Quick Evaluation

## 2015-03-01 NOTE — H&P (Signed)
  [facility]  [address] [place], [place] [address] Phone: [phone] Fax : [phone]  Primary Care Physician:  Lavera Guise, MD Primary Gastroenterologist:  Dr. Allen Norris  Pre-Procedure History & Physical: HPI:  Kim Ruiz is a 64 y.o. female is here for a screening colonoscopy.   Past Medical History  Diagnosis Date  . Hypertension   . Heart murmur   . Dysrhythmia     Past Surgical History  Procedure Laterality Date  . Knee arthroscopy    . Carpal tunnel release    . Abdominal hysterectomy    . Colonoscopy    . Knee arthroscopy with medial menisectomy Right 02/01/2015    Procedure: KNEE ARTHROSCOPY WITH MEDIAL MENISECTOMY;  Surgeon: Christophe Louis, MD;  Location: [facility] ORS;  Service: Orthopedics;  Laterality: Right;    Prior to Admission medications   Medication Sig Start Date End Date Taking? Authorizing Provider  hydrochlorothiazide (HYDRODIURIL) 25 MG tablet Take 25 mg by mouth daily.   Yes Historical Provider, MD  HYDROmorphone (DILAUDID) 2 MG tablet Take 1 tablet (2 mg total) by mouth every 4 (four) hours as needed for severe pain. 02/01/15  Yes Christophe Louis, MD  naproxen sodium (ANAPROX) 220 MG tablet Take 220 mg by mouth 2 (two) times daily with a meal.   Yes Historical Provider, MD    Allergies as of 01/13/2015  . (No Known Allergies)    History reviewed. No pertinent family history.  History   Social History  . Marital Status: Single    Spouse Name: N/A  . Number of Children: N/A  . Years of Education: N/A   Occupational History  . Not on file.   Social History Main Topics  . Smoking status: Former Smoker    Quit date: 01/27/1989  . Smokeless tobacco: Not on file  . Alcohol Use: No  . Drug Use: No  . Sexual Activity: Not on file   Other Topics Concern  . Not on file   Social History Narrative   ** Merged History Encounter **        Review of Systems: See HPI, otherwise negative ROS  Physical Exam: BP  130/71 mmHg  Pulse 69  Temp(Src) 96.2 F (35.7 C) (Tympanic)  Resp 16  Ht 5' 7.5" (1.715 m)  Wt 160 lb (72.576 kg)  BMI 24.68 kg/m2  SpO2 100% General:   Alert,  pleasant and cooperative in NAD Head:  Normocephalic and atraumatic. Neck:  Supple; no masses or thyromegaly. Lungs:  Clear throughout to auscultation.    Heart:  Regular rate and rhythm. Abdomen:  Soft, nontender and nondistended. Normal bowel sounds, without guarding, and without rebound.   Neurologic:  Alert and  oriented x4;  grossly normal neurologically.  Impression/Plan: RAIA AMICO is now here to undergo a screening colonoscopy.  Risks, benefits, and alternatives regarding colonoscopy have been reviewed with the patient.  Questions have been answered.  All parties agreeable.

## 2015-03-02 ENCOUNTER — Encounter: Payer: Self-pay | Admitting: Gastroenterology

## 2015-03-02 LAB — SURGICAL PATHOLOGY

## 2016-03-01 ENCOUNTER — Other Ambulatory Visit: Payer: Self-pay | Admitting: Family Medicine

## 2016-03-01 ENCOUNTER — Ambulatory Visit
Admission: RE | Admit: 2016-03-01 | Discharge: 2016-03-01 | Disposition: A | Discharge status: Home / Self Care | Payer: Medicare HMO | Source: Ambulatory Visit | Attending: Family Medicine | Admitting: Family Medicine

## 2016-03-01 DIAGNOSIS — M25552 Pain in left hip: Secondary | ICD-10-CM

## 2016-03-01 DIAGNOSIS — M5136 Other intervertebral disc degeneration, lumbar region: Secondary | ICD-10-CM | POA: Diagnosis not present

## 2016-03-01 DIAGNOSIS — M160 Bilateral primary osteoarthritis of hip: Secondary | ICD-10-CM | POA: Insufficient documentation

## 2016-12-13 ENCOUNTER — Other Ambulatory Visit: Payer: Self-pay | Admitting: Nurse Practitioner

## 2016-12-13 DIAGNOSIS — Z1239 Encounter for other screening for malignant neoplasm of breast: Secondary | ICD-10-CM

## 2016-12-13 DIAGNOSIS — Z13820 Encounter for screening for osteoporosis: Secondary | ICD-10-CM

## 2017-01-29 ENCOUNTER — Ambulatory Visit
Admission: RE | Admit: 2017-01-29 | Discharge: 2017-01-29 | Disposition: A | Discharge status: Home / Self Care | Payer: Medicare HMO | Source: Ambulatory Visit | Attending: Nurse Practitioner | Admitting: Nurse Practitioner

## 2017-01-29 DIAGNOSIS — Z13820 Encounter for screening for osteoporosis: Secondary | ICD-10-CM | POA: Diagnosis present

## 2017-01-29 DIAGNOSIS — Z1231 Encounter for screening mammogram for malignant neoplasm of breast: Secondary | ICD-10-CM | POA: Diagnosis not present

## 2017-01-29 DIAGNOSIS — Z780 Asymptomatic menopausal state: Secondary | ICD-10-CM | POA: Diagnosis not present

## 2017-01-29 DIAGNOSIS — N6489 Other specified disorders of breast: Secondary | ICD-10-CM | POA: Diagnosis not present

## 2017-01-29 DIAGNOSIS — Z1239 Encounter for other screening for malignant neoplasm of breast: Secondary | ICD-10-CM

## 2018-01-06 ENCOUNTER — Other Ambulatory Visit: Payer: Self-pay | Admitting: Nurse Practitioner

## 2018-01-06 ENCOUNTER — Other Ambulatory Visit: Payer: Self-pay | Admitting: Radiology

## 2018-01-06 DIAGNOSIS — Z1231 Encounter for screening mammogram for malignant neoplasm of breast: Secondary | ICD-10-CM

## 2018-02-07 ENCOUNTER — Ambulatory Visit
Admission: RE | Admit: 2018-02-07 | Discharge: 2018-02-07 | Disposition: A | Discharge status: Home / Self Care | Payer: Medicare HMO | Source: Ambulatory Visit | Attending: Nurse Practitioner | Admitting: Nurse Practitioner

## 2018-02-07 DIAGNOSIS — Z1231 Encounter for screening mammogram for malignant neoplasm of breast: Secondary | ICD-10-CM | POA: Insufficient documentation

## 2019-05-21 IMAGING — MG MM DIGITAL SCREENING BILAT W/ TOMO W/ CAD
8 series · 8 of 24 positions shown · non-contrast
Comparison: Previous exam(s).

CLINICAL DATA: Screening.

EXAM:
DIGITAL SCREENING BILATERAL MAMMOGRAM WITH TOMO AND CAD

[R CC synth-2D]
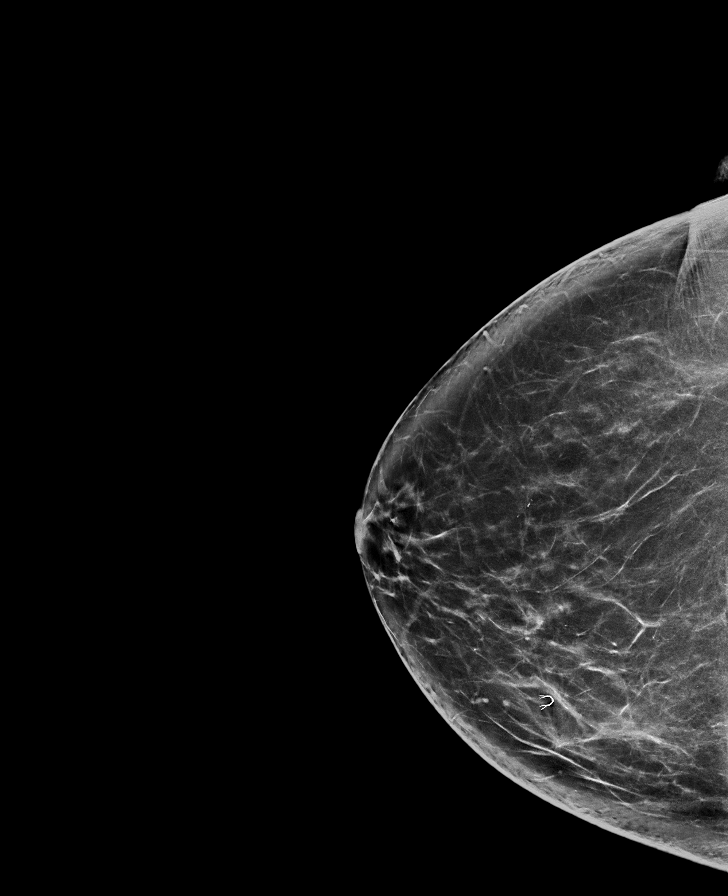

[R MLO synth-2D]
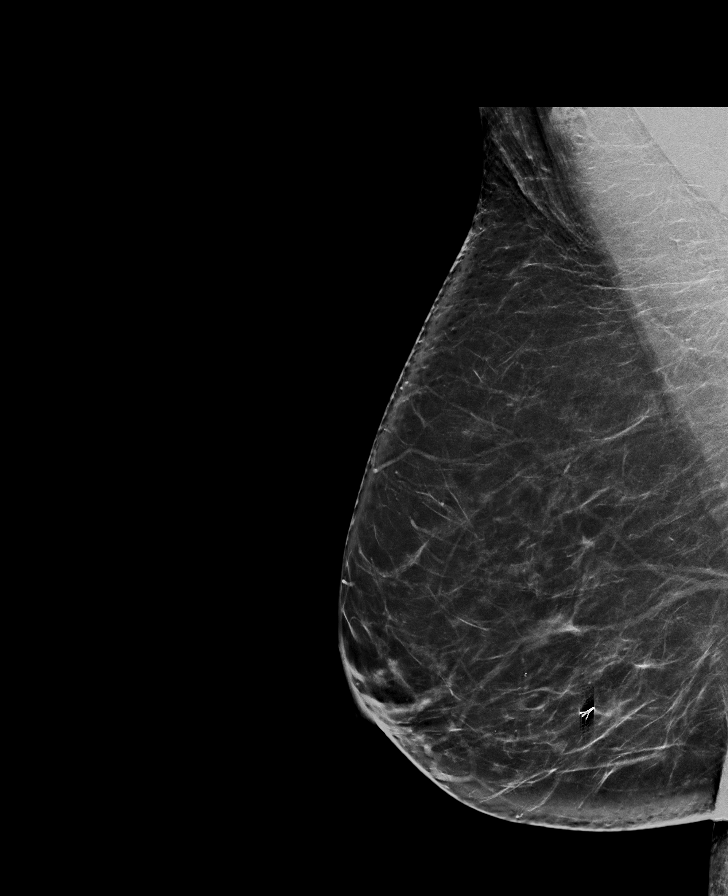

[L MLO synth-2D]
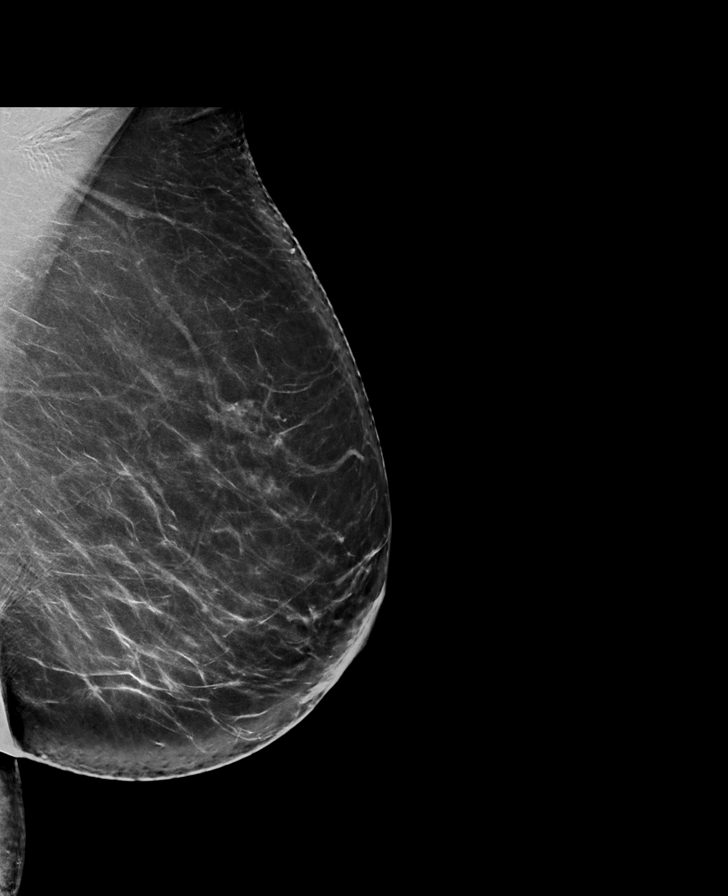

[L CC synth-2D]
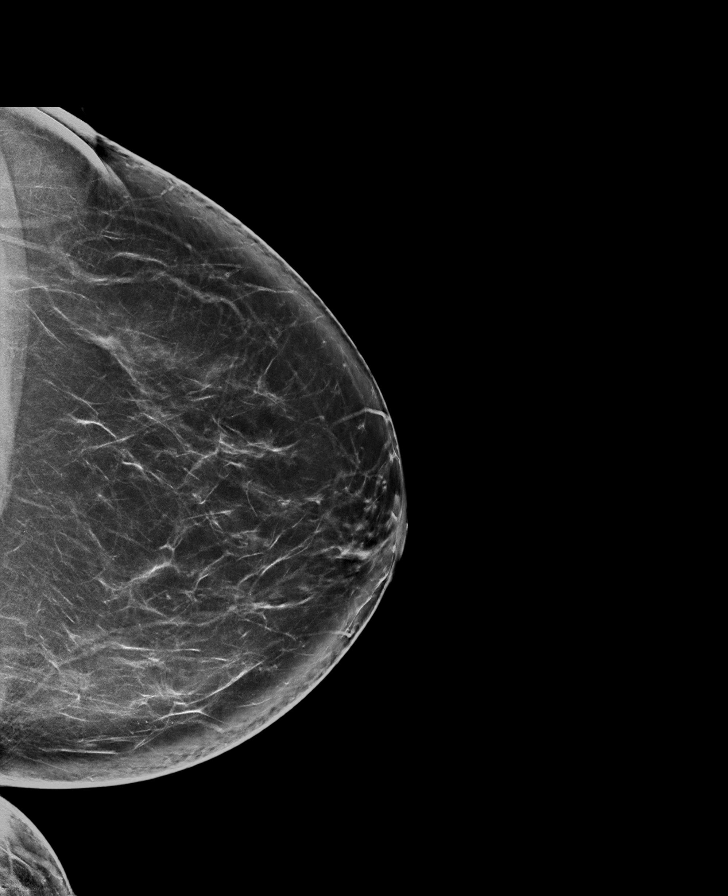

[L MLO tomo · tomo slice 47/93.0]
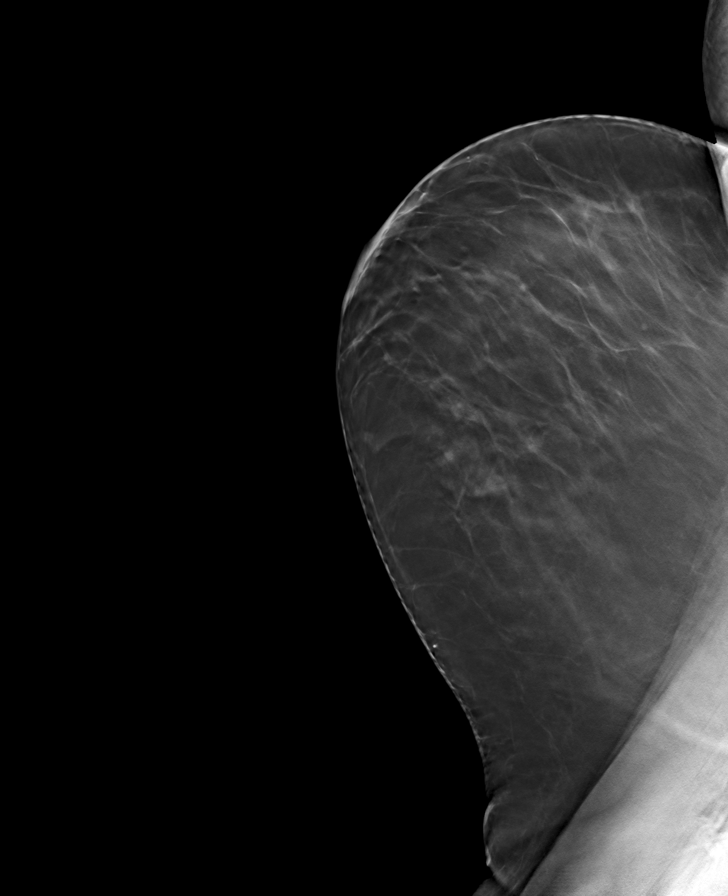

[R CC tomo · tomo slice 43/86.0]
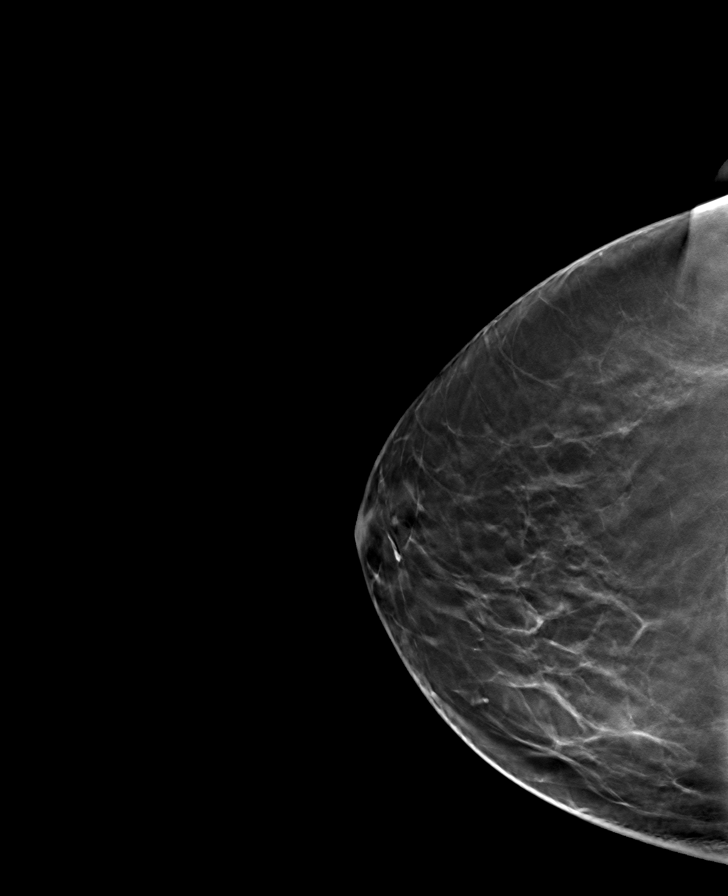

[R MLO tomo · tomo slice 43/85.0]
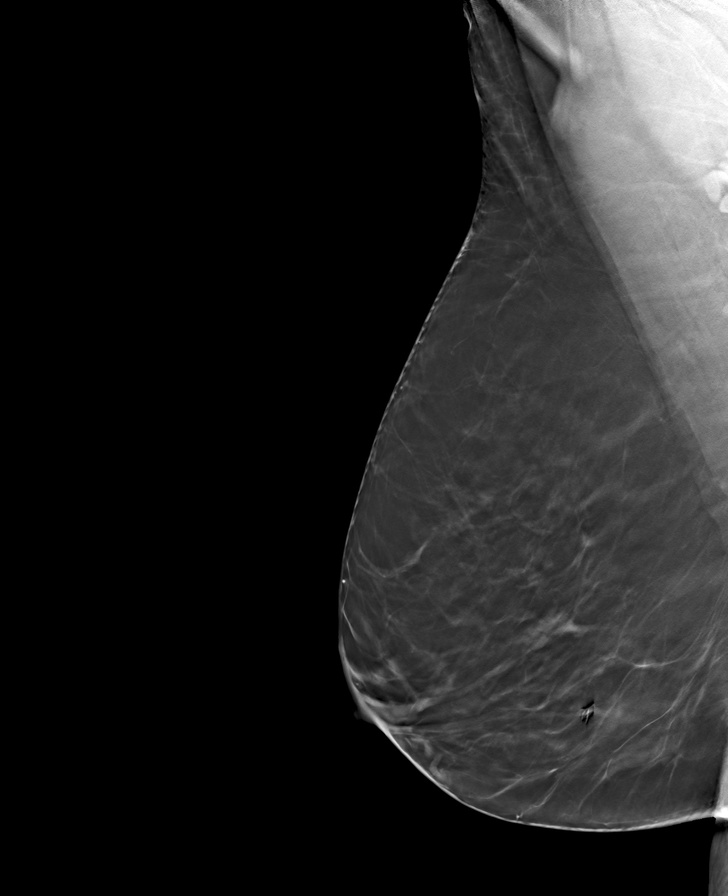

[L CC tomo · tomo slice 46/91.0]
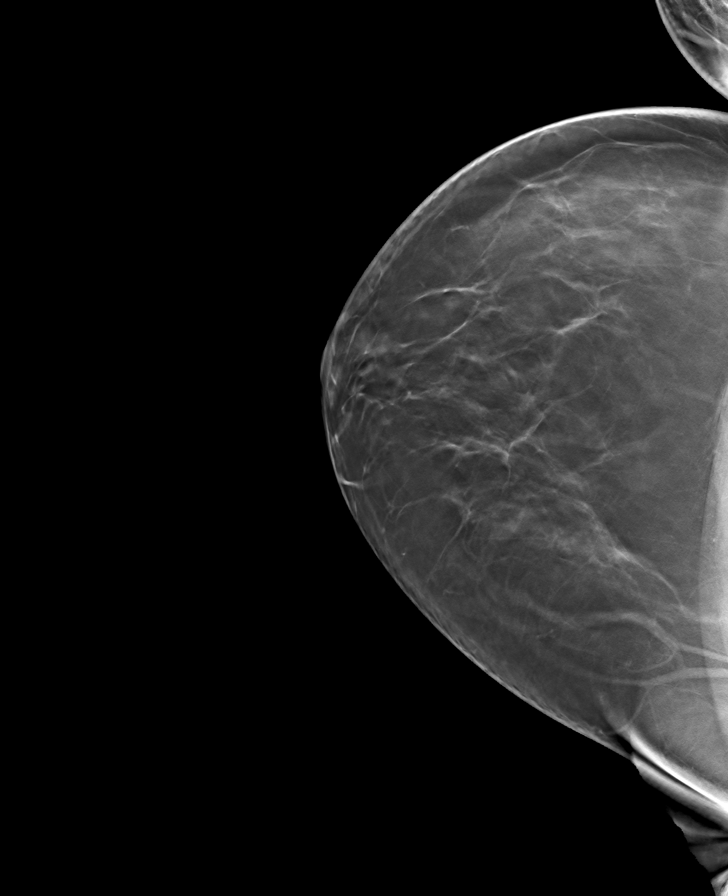

[8 of 24 positions shown; findings below may reference images not displayed]

ACR Breast Density Category b: There are scattered areas of
fibroglandular density.
FINDINGS: There are no findings suspicious for malignancy. Images were
processed with CAD.
IMPRESSION: No mammographic evidence of malignancy. A result letter of this
screening mammogram will be mailed directly to the patient.

RECOMMENDATION:
Screening mammogram in one year. (Code:[id number]-775)

BI-RADS CATEGORY  1: Negative.

## 2019-06-15 ENCOUNTER — Other Ambulatory Visit: Payer: Self-pay | Admitting: Family Medicine

## 2019-06-15 DIAGNOSIS — Z1231 Encounter for screening mammogram for malignant neoplasm of breast: Secondary | ICD-10-CM

## 2019-09-08 ENCOUNTER — Ambulatory Visit
Admission: RE | Admit: 2019-09-08 | Discharge: 2019-09-08 | Disposition: A | Discharge status: Home / Self Care | Payer: Medicare Other | Source: Ambulatory Visit | Attending: Family Medicine | Admitting: Family Medicine

## 2019-09-08 DIAGNOSIS — Z1231 Encounter for screening mammogram for malignant neoplasm of breast: Secondary | ICD-10-CM

## 2019-11-05 ENCOUNTER — Other Ambulatory Visit: Payer: Self-pay | Admitting: Family Medicine

## 2019-11-05 DIAGNOSIS — Z13820 Encounter for screening for osteoporosis: Secondary | ICD-10-CM

## 2020-01-26 ENCOUNTER — Ambulatory Visit: Payer: Medicare Other | Admitting: Urology

## 2020-01-28 ENCOUNTER — Ambulatory Visit: Payer: Medicare Other | Admitting: Urology

## 2020-02-15 ENCOUNTER — Ambulatory Visit: Payer: Medicare Other | Admitting: Urology

## 2020-02-16 ENCOUNTER — Encounter: Payer: Self-pay | Admitting: Urology

## 2020-02-29 ENCOUNTER — Ambulatory Visit (INDEPENDENT_AMBULATORY_CARE_PROVIDER_SITE_OTHER): Payer: Medicare Other | Admitting: Urology

## 2020-02-29 ENCOUNTER — Other Ambulatory Visit: Payer: Self-pay

## 2020-02-29 ENCOUNTER — Encounter: Payer: Self-pay | Admitting: Urology

## 2020-02-29 VITALS — BP 157/72 | HR 65 | Ht 67.0 in | Wt 173.2 lb

## 2020-02-29 DIAGNOSIS — M25669 Stiffness of unspecified knee, not elsewhere classified: Secondary | ICD-10-CM | POA: Insufficient documentation

## 2020-02-29 DIAGNOSIS — M25569 Pain in unspecified knee: Secondary | ICD-10-CM | POA: Insufficient documentation

## 2020-02-29 DIAGNOSIS — R269 Unspecified abnormalities of gait and mobility: Secondary | ICD-10-CM | POA: Insufficient documentation

## 2020-02-29 DIAGNOSIS — R319 Hematuria, unspecified: Secondary | ICD-10-CM | POA: Diagnosis not present

## 2020-02-29 DIAGNOSIS — N3281 Overactive bladder: Secondary | ICD-10-CM

## 2020-02-29 DIAGNOSIS — S83249A Other tear of medial meniscus, current injury, unspecified knee, initial encounter: Secondary | ICD-10-CM | POA: Insufficient documentation

## 2020-02-29 NOTE — Progress Notes (Signed)
PATIENT ID: Kim Ruiz, female     DOB: [birth year], 69 y.o.     MRN: [id number]   ENCOUNTER: 02/29/20, 3:29 PM     REFERRING PROVIDER: Marguerita Merles, [address] [place],  [place] [address]  Chief Complaint  Patient presents with  . Hematuria    HPI: Kim Ruiz is a 69 y.o. female presenting today for evaluation of her hematuria.  Today she reports: Marland Kitchen Symptoms of increased frequency without incontinence.  . Denies visible blood in urine . Denies flank pain or dysuria . Smoking hx of 5-6 years . Hx of hysterectomy, no childbirth . Referring notes were reviewed and recent urinalysis was dipstick only.  Office was contacted to send all available urine results and a second UA from 2017 was dipstick only   PMHx: Past Medical History:  Diagnosis Date  . Dysrhythmia   . Heart murmur   . Hypertension     SURGICAL HISTORY: Past Surgical History:  Procedure Laterality Date  . ABDOMINAL HYSTERECTOMY    . BREAST BIOPSY Right 03/2013  . CARPAL TUNNEL RELEASE    . COLONOSCOPY    . COLONOSCOPY WITH PROPOFOL N/A 03/01/2015   Procedure: COLONOSCOPY WITH PROPOFOL;  Surgeon: Lucilla Lame, MD;  Location: [facility] END[facility]COPY;  Service: Endoscopy;  Laterality: N/A;  . KNEE ARTHROSCOPY    . KNEE ARTHROSCOPY WITH MEDIAL MENISECTOMY Right 02/01/2015   Procedure: KNEE ARTHROSCOPY WITH MEDIAL MENISECTOMY;  Surgeon: Christophe Louis, MD;  Location: [facility] ORS;  Service: Orthopedics;  Laterality: Right;    HOME MEDICATIONS:  Allergies as of 02/29/2020   No Known Allergies     Medication List       Accurate as of February 29, 2020  3:29 PM. If you have any questions, ask your nurse or doctor.        acyclovir 400 MG tablet Commonly known as: ZOVIRAX Take 400 mg by mouth 2 (two) times daily.   amLODipine 10 MG tablet Commonly known as: NORVASC Take 10 mg by mouth daily.   atorvastatin 40 MG tablet Commonly known as: LIPITOR Take 40 mg by mouth at bedtime.     diclofenac 75 MG EC tablet Commonly known as: VOLTAREN diclofenac sodium 75 mg tablet,delayed release   hydrochlorothiazide 25 MG tablet Commonly known as: HYDRODIURIL Take 25 mg by mouth daily.   HYDROmorphone 2 MG tablet Commonly known as: Dilaudid Take 1 tablet (2 mg total) by mouth every 4 (four) hours as needed for severe pain.   meloxicam 7.5 MG tablet Commonly known as: MOBIC Take 7.5 mg by mouth daily.   naproxen sodium 220 MG tablet Commonly known as: ALEVE Take 220 mg by mouth 2 (two) times daily with a meal.       ALLERGIES: No Known Allergies  FAMILY HISTORY: Family History  Problem Relation Age of Onset  . Breast cancer Sister 41  . Breast cancer Other        40's    SOCIAL HISTORY:  reports that she quit smoking about 31 years ago. She does not have any smokeless tobacco history on file. She reports that she does not drink alcohol and does not use drugs.  PHYSICAL EXAM: BP (!) 157/72 (BP Location: Left Arm, Patient Position: Sitting, Cuff Size: Normal)   Pulse 65   Ht 5\' 7"  (1.702 m)   Wt 173 lb 3.2 oz (78.6 kg)   BMI 27.13 kg/m   Constitutional:  Alert and oriented, No acute distress. HEENT: [place] AT, moist mucus  membranes.  Trachea midline, no masses. Cardiovascular: No clubbing, cyanosis, or edema. Respiratory: Normal respiratory effort, no increased work of breathing. Skin: No rashes, bruises or suspicious lesions. Neurologic: Grossly intact, no focal deficits, moving all 4 extremities. Psychiatric: Normal mood and affect.  LABORATORY DATA:     ASSESSMENT/PLAN:  1.  Dipstick positive hematuria  Based on American Urological Association practice guidelines asymptomatic microhematuria [facility]) is defined as three or greater red blood cells per high powered field on a properly collected urinary specimen in the absence of an obvious benign cause. A positive dipstick does not define AMH, and evaluation should be based solely on findings from  microscopic examination of urinary sediment and not on a dipstick reading.  Complete urinalysis today showed 2+ blood on dipstick and no RBCs on microscopy  Repeat complete urinalysis in 4-6 months  2.  Urinary frequency  Symptoms consistent with overactive bladder  She declined medical management or pelvic floor physical therapy  Behavioral therapy was discussed   Abbie Sons, MD  [facility] [address] [place], [place] [address] [phone]  By signing my name below, I, [employer], attest that this documentation has been prepared under the direction and in the presence of John Giovanni, MD. Electronically Signed: Franchot Erichsen 02/29/20, 3:29 PM   I have reviewed the above documentation for accuracy and completeness, and I agree with the above.   Abbie Sons, MD

## 2020-02-29 NOTE — Patient Instructions (Signed)
Hematuria, Adult Hematuria is blood in the urine. Blood may be visible in the urine, or it may be identified with a test. This condition can be caused by infections of the bladder, urethra, kidney, or prostate. Other possible causes include:  Kidney stones.  Cancer of the urinary tract.  Too much calcium in the urine.  Conditions that are passed from parent to child (inherited conditions).  Exercise that requires a lot of energy. Infections can usually be treated with medicine, and a kidney stone usually will pass through your urine. If neither of these is the cause of your hematuria, more tests may be needed to identify the cause of your symptoms. It is very important to tell your health care provider about any blood in your urine, even if it is painless or the blood stops without treatment. Blood in the urine, when it happens and then stops and then happens again, can be a symptom of a very serious condition, including cancer. There is no pain in the initial stages of many urinary cancers. Follow these instructions at home: Medicines  Take over-the-counter and prescription medicines only as told by your health care provider.  If you were prescribed an antibiotic medicine, take it as told by your health care provider. Do not stop taking the antibiotic even if you start to feel better. Eating and drinking  Drink enough fluid to keep your urine clear or pale yellow. It is recommended that you drink 3-4 quarts (2.8-3.8 L) a day. If you have been diagnosed with an infection, it is recommended that you drink cranberry juice in addition to large amounts of water.  Avoid caffeine, tea, and carbonated beverages. These tend to irritate the bladder.  Avoid alcohol because it may irritate the prostate (men). General instructions  If you have been diagnosed with a kidney stone, follow your health care provider's instructions about straining your urine to catch the stone.  Empty your bladder  often. Avoid holding urine for long periods of time.  If you are female: ? After a bowel movement, wipe from front to back and use each piece of toilet paper only once. ? Empty your bladder before and after sex.  Pay attention to any changes in your symptoms. Tell your health care provider about any changes or any new symptoms.  It is your responsibility to get your test results. Ask your health care provider, or the department performing the test, when your results will be ready.  Keep all follow-up visits as told by your health care provider. This is important. Contact a health care provider if:  You develop back pain.  You have a fever.  You have nausea or vomiting.  Your symptoms do not improve after 3 days.  Your symptoms get worse. Get help right away if:  You develop severe vomiting and are unable take medicine without vomiting.  You develop severe pain in your back or abdomen even though you are taking medicine.  You pass a large amount of blood in your urine.  You pass blood clots in your urine.  You feel very weak or like you might faint.  You faint. Summary  Hematuria is blood in the urine. It has many possible causes.  It is very important that you tell your health care provider about any blood in your urine, even if it is painless or the blood stops without treatment.  Take over-the-counter and prescription medicines only as told by your health care provider.  Drink enough fluid to keep   your urine clear or pale yellow. This information is not intended to replace advice given to you by your health care provider. Make sure you discuss any questions you have with your health care provider. Document Revised: 01/14/2019 Document Reviewed: 09/22/2016 Elsevier Patient Education  2020 Elsevier Inc.  

## 2020-03-01 LAB — URINALYSIS, COMPLETE
Bilirubin, UA: NEGATIVE
Glucose, UA: NEGATIVE
Ketones, UA: NEGATIVE
Leukocytes,UA: NEGATIVE
Nitrite, UA: NEGATIVE
Protein,UA: NEGATIVE
Specific Gravity, UA: 1.025 (ref 1.005–1.030)
Urobilinogen, Ur: 0.2 mg/dL (ref 0.2–1.0)
pH, UA: 5.5 (ref 5.0–7.5)

## 2020-03-01 LAB — MICROSCOPIC EXAMINATION: Bacteria, UA: NONE SEEN

## 2020-03-02 ENCOUNTER — Encounter: Payer: Self-pay | Admitting: Urology

## 2020-06-30 ENCOUNTER — Other Ambulatory Visit: Payer: Self-pay

## 2020-08-10 ENCOUNTER — Other Ambulatory Visit: Payer: Self-pay | Admitting: Family Medicine

## 2020-08-10 DIAGNOSIS — Z1231 Encounter for screening mammogram for malignant neoplasm of breast: Secondary | ICD-10-CM

## 2020-09-19 ENCOUNTER — Other Ambulatory Visit: Payer: BLUE CROSS/BLUE SHIELD

## 2020-09-21 ENCOUNTER — Other Ambulatory Visit: Payer: BLUE CROSS/BLUE SHIELD

## 2020-11-17 ENCOUNTER — Ambulatory Visit
Admission: RE | Admit: 2020-11-17 | Discharge: 2020-11-17 | Disposition: A | Discharge status: Home / Self Care | Payer: Medicare Other | Source: Ambulatory Visit | Attending: Family Medicine | Admitting: Family Medicine

## 2020-11-17 ENCOUNTER — Other Ambulatory Visit: Payer: Self-pay

## 2020-11-17 DIAGNOSIS — M8589 Other specified disorders of bone density and structure, multiple sites: Secondary | ICD-10-CM | POA: Diagnosis not present

## 2020-11-17 DIAGNOSIS — Z780 Asymptomatic menopausal state: Secondary | ICD-10-CM | POA: Diagnosis not present

## 2020-11-17 DIAGNOSIS — Z1231 Encounter for screening mammogram for malignant neoplasm of breast: Secondary | ICD-10-CM | POA: Diagnosis present

## 2020-11-17 DIAGNOSIS — Z13820 Encounter for screening for osteoporosis: Secondary | ICD-10-CM | POA: Insufficient documentation

## 2021-05-29 DIAGNOSIS — Z23 Encounter for immunization: Secondary | ICD-10-CM | POA: Diagnosis not present

## 2021-10-23 ENCOUNTER — Other Ambulatory Visit: Payer: Self-pay | Admitting: Family Medicine

## 2021-10-23 DIAGNOSIS — Z1231 Encounter for screening mammogram for malignant neoplasm of breast: Secondary | ICD-10-CM

## 2021-12-30 ENCOUNTER — Emergency Department
Admission: EM | Admit: 2021-12-30 | Discharge: 2021-12-30 | Disposition: A | Discharge status: Home / Self Care | Payer: Medicare (Managed Care) | Attending: Emergency Medicine | Admitting: Emergency Medicine

## 2021-12-30 ENCOUNTER — Emergency Department: Payer: Medicare (Managed Care)

## 2021-12-30 ENCOUNTER — Other Ambulatory Visit: Payer: Self-pay

## 2021-12-30 DIAGNOSIS — M25561 Pain in right knee: Secondary | ICD-10-CM | POA: Diagnosis not present

## 2021-12-30 DIAGNOSIS — Y92410 Unspecified street and highway as the place of occurrence of the external cause: Secondary | ICD-10-CM | POA: Insufficient documentation

## 2021-12-30 DIAGNOSIS — S3992XA Unspecified injury of lower back, initial encounter: Secondary | ICD-10-CM | POA: Diagnosis present

## 2021-12-30 DIAGNOSIS — M25562 Pain in left knee: Secondary | ICD-10-CM | POA: Insufficient documentation

## 2021-12-30 DIAGNOSIS — M542 Cervicalgia: Secondary | ICD-10-CM | POA: Diagnosis not present

## 2021-12-30 DIAGNOSIS — S39012A Strain of muscle, fascia and tendon of lower back, initial encounter: Secondary | ICD-10-CM | POA: Diagnosis not present

## 2021-12-30 MED ORDER — OXYCODONE-ACETAMINOPHEN 5-325 MG PO TABS
1.0000 | ORAL_TABLET | Freq: Once | ORAL | Status: DC
  Filled 2021-12-30: qty 1

## 2021-12-30 MED ORDER — HYDROCODONE-ACETAMINOPHEN 5-325 MG PO TABS: 1.0000 | ORAL_TABLET | ORAL | 0 refills | Status: DC | PRN

## 2021-12-30 MED ORDER — METHOCARBAMOL 500 MG PO TABS
500.0000 mg | ORAL_TABLET | Freq: Once | ORAL | Status: AC
  Administered 2021-12-30: 500 mg via ORAL
  Filled 2021-12-30: qty 1

## 2021-12-30 MED ORDER — NAPROXEN 375 MG PO TABS
375.0000 mg | ORAL_TABLET | Freq: Once | ORAL | Status: AC
  Administered 2021-12-30: 375 mg via ORAL
  Filled 2021-12-30: qty 1

## 2021-12-30 MED ORDER — CYCLOBENZAPRINE HCL 5 MG PO TABS: 5.0000 mg | ORAL_TABLET | Freq: Two times a day (BID) | ORAL | 0 refills | Status: DC | PRN

## 2021-12-30 MED ORDER — ONDANSETRON 4 MG PO TBDP
4.0000 mg | ORAL_TABLET | Freq: Once | ORAL | Status: AC
  Administered 2021-12-30: 4 mg via ORAL
  Filled 2021-12-30: qty 1

## 2021-12-30 MED ORDER — HYDROCODONE-ACETAMINOPHEN 5-325 MG PO TABS: 1.0000 | ORAL_TABLET | ORAL | 0 refills | Status: AC | PRN

## 2021-12-30 MED ORDER — OXYCODONE-ACETAMINOPHEN 5-325 MG PO TABS
1.0000 | ORAL_TABLET | Freq: Once | ORAL | Status: AC
  Administered 2021-12-30: 1 via ORAL
  Filled 2021-12-30: qty 1
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
## 2021-12-30 NOTE — ED Notes (Signed)
Dc ppw provided. Pt verbalized consent for dc. Pt denies any questions at this time. Pt rx info and followup given. Pt assisted off unitvia wheelchair.  ?

## 2021-12-30 NOTE — ED Notes (Signed)
Pt ambulated around room with no concerns.  ?
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
## 2021-12-30 NOTE — ED Notes (Signed)
Pt asking to be taken off monitor in room. Pt alert and oriented at this time ? ?

## 2022-04-10 ENCOUNTER — Ambulatory Visit
Admission: RE | Admit: 2022-04-10 | Discharge: 2022-04-10 | Disposition: A | Discharge status: Home / Self Care | Payer: Medicare (Managed Care) | Source: Ambulatory Visit | Attending: Family Medicine | Admitting: Family Medicine

## 2022-04-10 DIAGNOSIS — Z1231 Encounter for screening mammogram for malignant neoplasm of breast: Secondary | ICD-10-CM | POA: Insufficient documentation

## 2022-05-10 ENCOUNTER — Other Ambulatory Visit: Payer: Self-pay | Admitting: Student

## 2022-05-10 DIAGNOSIS — R413 Other amnesia: Secondary | ICD-10-CM

## 2022-05-10 DIAGNOSIS — R519 Headache, unspecified: Secondary | ICD-10-CM

## 2022-05-15 ENCOUNTER — Ambulatory Visit
Admission: RE | Admit: 2022-05-15 | Discharge: 2022-05-15 | Disposition: A | Discharge status: Home / Self Care | Payer: Medicare (Managed Care) | Source: Ambulatory Visit | Attending: Student | Admitting: Student

## 2022-05-15 DIAGNOSIS — R519 Headache, unspecified: Secondary | ICD-10-CM

## 2022-05-15 DIAGNOSIS — R413 Other amnesia: Secondary | ICD-10-CM

## 2022-06-12 ENCOUNTER — Other Ambulatory Visit: Payer: Self-pay | Admitting: Physician Assistant

## 2022-06-12 ENCOUNTER — Other Ambulatory Visit (HOSPITAL_COMMUNITY): Payer: Self-pay | Admitting: Physician Assistant

## 2022-06-12 DIAGNOSIS — M542 Cervicalgia: Secondary | ICD-10-CM

## 2022-06-18 ENCOUNTER — Ambulatory Visit: Payer: Medicare (Managed Care) | Attending: Neurology

## 2022-06-18 DIAGNOSIS — M542 Cervicalgia: Secondary | ICD-10-CM

## 2022-06-18 DIAGNOSIS — R2689 Other abnormalities of gait and mobility: Secondary | ICD-10-CM | POA: Diagnosis present

## 2022-06-18 DIAGNOSIS — M6281 Muscle weakness (generalized): Secondary | ICD-10-CM | POA: Diagnosis present

## 2022-06-18 DIAGNOSIS — R29898 Other symptoms and signs involving the musculoskeletal system: Secondary | ICD-10-CM

## 2022-06-18 DIAGNOSIS — R262 Difficulty in walking, not elsewhere classified: Secondary | ICD-10-CM | POA: Diagnosis present

## 2022-06-18 NOTE — Addendum Note (Signed)
Addended by: Christie Nottingham on: 06/18/2022 05:11 PM   Modules accepted: Orders
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
## 2022-06-20 ENCOUNTER — Ambulatory Visit: Payer: Medicare (Managed Care)

## 2022-06-20 DIAGNOSIS — R262 Difficulty in walking, not elsewhere classified: Secondary | ICD-10-CM

## 2022-06-20 DIAGNOSIS — R2689 Other abnormalities of gait and mobility: Secondary | ICD-10-CM

## 2022-06-20 DIAGNOSIS — M6281 Muscle weakness (generalized): Secondary | ICD-10-CM
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
## 2022-06-22 ENCOUNTER — Ambulatory Visit
Admission: RE | Admit: 2022-06-22 | Discharge: 2022-06-22 | Disposition: A | Discharge status: Home / Self Care | Payer: Medicare (Managed Care) | Source: Ambulatory Visit | Attending: Physician Assistant | Admitting: Physician Assistant

## 2022-06-22 DIAGNOSIS — M542 Cervicalgia: Secondary | ICD-10-CM | POA: Insufficient documentation

## 2022-06-25 ENCOUNTER — Ambulatory Visit: Payer: Medicare (Managed Care)

## 2022-06-25 DIAGNOSIS — M542 Cervicalgia: Secondary | ICD-10-CM

## 2022-06-25 DIAGNOSIS — R262 Difficulty in walking, not elsewhere classified: Secondary | ICD-10-CM

## 2022-06-25 DIAGNOSIS — R2689 Other abnormalities of gait and mobility: Secondary | ICD-10-CM

## 2022-06-25 DIAGNOSIS — M6281 Muscle weakness (generalized): Secondary | ICD-10-CM

## 2022-06-25 DIAGNOSIS — R29898 Other symptoms and signs involving the musculoskeletal system: Secondary | ICD-10-CM
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
## 2022-06-27 ENCOUNTER — Ambulatory Visit: Payer: Medicare (Managed Care)

## 2022-06-27 DIAGNOSIS — R262 Difficulty in walking, not elsewhere classified: Secondary | ICD-10-CM | POA: Diagnosis not present

## 2022-06-27 DIAGNOSIS — R2689 Other abnormalities of gait and mobility: Secondary | ICD-10-CM

## 2022-06-27 DIAGNOSIS — M6281 Muscle weakness (generalized): Secondary | ICD-10-CM

## 2022-06-27 DIAGNOSIS — M542 Cervicalgia: Secondary | ICD-10-CM

## 2022-07-04 ENCOUNTER — Ambulatory Visit: Payer: Medicare (Managed Care)

## 2022-07-09 ENCOUNTER — Ambulatory Visit: Payer: Medicare (Managed Care) | Attending: Neurology

## 2022-07-09 DIAGNOSIS — M6281 Muscle weakness (generalized): Secondary | ICD-10-CM | POA: Insufficient documentation

## 2022-07-09 DIAGNOSIS — R2689 Other abnormalities of gait and mobility: Secondary | ICD-10-CM | POA: Insufficient documentation

## 2022-07-09 DIAGNOSIS — R262 Difficulty in walking, not elsewhere classified: Secondary | ICD-10-CM | POA: Diagnosis present

## 2022-07-09 DIAGNOSIS — M542 Cervicalgia: Secondary | ICD-10-CM | POA: Diagnosis present

## 2022-07-09 DIAGNOSIS — R29898 Other symptoms and signs involving the musculoskeletal system: Secondary | ICD-10-CM | POA: Diagnosis present
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
## 2022-07-11 ENCOUNTER — Ambulatory Visit: Payer: Medicare (Managed Care)

## 2022-07-11 DIAGNOSIS — R262 Difficulty in walking, not elsewhere classified: Secondary | ICD-10-CM

## 2022-07-11 DIAGNOSIS — R29898 Other symptoms and signs involving the musculoskeletal system: Secondary | ICD-10-CM

## 2022-07-11 DIAGNOSIS — M6281 Muscle weakness (generalized): Secondary | ICD-10-CM

## 2022-07-11 DIAGNOSIS — R2689 Other abnormalities of gait and mobility: Secondary | ICD-10-CM
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
## 2022-07-16 ENCOUNTER — Ambulatory Visit: Payer: Medicare (Managed Care)

## 2022-07-16 DIAGNOSIS — R262 Difficulty in walking, not elsewhere classified: Secondary | ICD-10-CM | POA: Diagnosis not present

## 2022-07-16 DIAGNOSIS — M6281 Muscle weakness (generalized): Secondary | ICD-10-CM
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
## 2022-07-18 ENCOUNTER — Ambulatory Visit: Payer: Medicare (Managed Care)

## 2022-07-18 DIAGNOSIS — R262 Difficulty in walking, not elsewhere classified: Secondary | ICD-10-CM | POA: Diagnosis not present

## 2022-07-18 DIAGNOSIS — R2689 Other abnormalities of gait and mobility: Secondary | ICD-10-CM

## 2022-07-18 DIAGNOSIS — R29898 Other symptoms and signs involving the musculoskeletal system: Secondary | ICD-10-CM

## 2022-07-18 DIAGNOSIS — M542 Cervicalgia: Secondary | ICD-10-CM

## 2022-07-18 DIAGNOSIS — M6281 Muscle weakness (generalized): Secondary | ICD-10-CM
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
## 2022-07-23 ENCOUNTER — Ambulatory Visit: Payer: Medicare (Managed Care)

## 2022-07-23 DIAGNOSIS — R2689 Other abnormalities of gait and mobility: Secondary | ICD-10-CM

## 2022-07-23 DIAGNOSIS — R262 Difficulty in walking, not elsewhere classified: Secondary | ICD-10-CM

## 2022-07-23 DIAGNOSIS — R29898 Other symptoms and signs involving the musculoskeletal system: Secondary | ICD-10-CM

## 2022-07-23 DIAGNOSIS — M6281 Muscle weakness (generalized): Secondary | ICD-10-CM

## 2022-07-23 DIAGNOSIS — M542 Cervicalgia: Secondary | ICD-10-CM
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
## 2022-07-25 ENCOUNTER — Ambulatory Visit: Payer: Medicare (Managed Care)

## 2022-07-30 ENCOUNTER — Ambulatory Visit: Payer: Medicare (Managed Care)
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
## 2022-08-01 ENCOUNTER — Ambulatory Visit: Payer: Medicare (Managed Care)

## 2022-08-01 DIAGNOSIS — R262 Difficulty in walking, not elsewhere classified: Secondary | ICD-10-CM

## 2022-08-01 DIAGNOSIS — M6281 Muscle weakness (generalized): Secondary | ICD-10-CM

## 2022-08-01 DIAGNOSIS — R2689 Other abnormalities of gait and mobility: Secondary | ICD-10-CM
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
## 2022-08-07 ENCOUNTER — Ambulatory Visit: Payer: Medicare (Managed Care) | Attending: Neurology

## 2022-08-07 DIAGNOSIS — R29898 Other symptoms and signs involving the musculoskeletal system: Secondary | ICD-10-CM | POA: Diagnosis present

## 2022-08-07 DIAGNOSIS — R262 Difficulty in walking, not elsewhere classified: Secondary | ICD-10-CM | POA: Diagnosis present

## 2022-08-07 DIAGNOSIS — R2689 Other abnormalities of gait and mobility: Secondary | ICD-10-CM | POA: Insufficient documentation

## 2022-08-07 DIAGNOSIS — M6281 Muscle weakness (generalized): Secondary | ICD-10-CM | POA: Insufficient documentation

## 2022-08-07 DIAGNOSIS — M542 Cervicalgia: Secondary | ICD-10-CM | POA: Diagnosis present
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
## 2022-08-09 ENCOUNTER — Ambulatory Visit: Payer: Medicare (Managed Care)
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
## 2022-08-14 ENCOUNTER — Ambulatory Visit: Payer: Medicare (Managed Care)

## 2022-08-16 ENCOUNTER — Ambulatory Visit: Payer: Medicare (Managed Care)

## 2022-08-16 DIAGNOSIS — R2689 Other abnormalities of gait and mobility: Secondary | ICD-10-CM

## 2022-08-16 DIAGNOSIS — R262 Difficulty in walking, not elsewhere classified: Secondary | ICD-10-CM

## 2022-08-16 DIAGNOSIS — R29898 Other symptoms and signs involving the musculoskeletal system: Secondary | ICD-10-CM

## 2022-08-16 DIAGNOSIS — M542 Cervicalgia: Secondary | ICD-10-CM

## 2022-08-16 DIAGNOSIS — M6281 Muscle weakness (generalized): Secondary | ICD-10-CM
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
## 2022-08-21 ENCOUNTER — Ambulatory Visit: Payer: Medicare (Managed Care)

## 2022-08-21 DIAGNOSIS — M6281 Muscle weakness (generalized): Secondary | ICD-10-CM

## 2022-08-21 DIAGNOSIS — R262 Difficulty in walking, not elsewhere classified: Secondary | ICD-10-CM | POA: Diagnosis not present

## 2022-08-21 DIAGNOSIS — M542 Cervicalgia: Secondary | ICD-10-CM

## 2022-08-21 DIAGNOSIS — R2689 Other abnormalities of gait and mobility: Secondary | ICD-10-CM

## 2022-08-21 DIAGNOSIS — R29898 Other symptoms and signs involving the musculoskeletal system: Secondary | ICD-10-CM
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
## 2022-08-23 ENCOUNTER — Ambulatory Visit: Payer: Medicare (Managed Care)

## 2022-08-23 DIAGNOSIS — M542 Cervicalgia: Secondary | ICD-10-CM

## 2022-08-23 DIAGNOSIS — M6281 Muscle weakness (generalized): Secondary | ICD-10-CM

## 2022-08-23 DIAGNOSIS — R2689 Other abnormalities of gait and mobility: Secondary | ICD-10-CM

## 2022-08-23 DIAGNOSIS — R262 Difficulty in walking, not elsewhere classified: Secondary | ICD-10-CM

## 2022-08-23 DIAGNOSIS — R29898 Other symptoms and signs involving the musculoskeletal system: Secondary | ICD-10-CM
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
## 2022-08-29 ENCOUNTER — Ambulatory Visit: Payer: Medicare (Managed Care)

## 2022-08-29 DIAGNOSIS — R2689 Other abnormalities of gait and mobility: Secondary | ICD-10-CM

## 2022-08-29 DIAGNOSIS — M542 Cervicalgia: Secondary | ICD-10-CM

## 2022-08-29 DIAGNOSIS — R262 Difficulty in walking, not elsewhere classified: Secondary | ICD-10-CM | POA: Diagnosis not present

## 2022-08-29 DIAGNOSIS — M6281 Muscle weakness (generalized): Secondary | ICD-10-CM

## 2022-08-29 DIAGNOSIS — R29898 Other symptoms and signs involving the musculoskeletal system: Secondary | ICD-10-CM
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
## 2022-08-30 ENCOUNTER — Ambulatory Visit: Payer: Medicare (Managed Care)

## 2022-09-04 ENCOUNTER — Ambulatory Visit: Payer: Medicare (Managed Care) | Attending: Neurology

## 2022-09-04 DIAGNOSIS — M6281 Muscle weakness (generalized): Secondary | ICD-10-CM | POA: Diagnosis present

## 2022-09-04 DIAGNOSIS — M25552 Pain in left hip: Secondary | ICD-10-CM | POA: Insufficient documentation

## 2022-09-04 DIAGNOSIS — M542 Cervicalgia: Secondary | ICD-10-CM | POA: Diagnosis present

## 2022-09-04 DIAGNOSIS — R29898 Other symptoms and signs involving the musculoskeletal system: Secondary | ICD-10-CM | POA: Diagnosis present

## 2022-09-04 DIAGNOSIS — R262 Difficulty in walking, not elsewhere classified: Secondary | ICD-10-CM | POA: Insufficient documentation

## 2022-09-04 DIAGNOSIS — R2689 Other abnormalities of gait and mobility: Secondary | ICD-10-CM | POA: Insufficient documentation

## 2022-09-04 DIAGNOSIS — R2681 Unsteadiness on feet: Secondary | ICD-10-CM | POA: Diagnosis present
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
## 2022-09-10 ENCOUNTER — Ambulatory Visit: Payer: Medicare (Managed Care)

## 2022-09-10 DIAGNOSIS — R262 Difficulty in walking, not elsewhere classified: Secondary | ICD-10-CM | POA: Diagnosis not present

## 2022-09-10 DIAGNOSIS — M542 Cervicalgia: Secondary | ICD-10-CM

## 2022-09-10 DIAGNOSIS — R29898 Other symptoms and signs involving the musculoskeletal system: Secondary | ICD-10-CM

## 2022-09-10 DIAGNOSIS — R2689 Other abnormalities of gait and mobility: Secondary | ICD-10-CM

## 2022-09-10 DIAGNOSIS — M6281 Muscle weakness (generalized): Secondary | ICD-10-CM

## 2022-09-10 NOTE — Addendum Note (Signed)
Addended by: Christie Nottingham on: 09/10/2022 06:04 PM   Modules accepted: Orders
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
## 2022-09-12 ENCOUNTER — Ambulatory Visit: Payer: Medicare (Managed Care)

## 2022-09-12 DIAGNOSIS — M25552 Pain in left hip: Secondary | ICD-10-CM

## 2022-09-12 DIAGNOSIS — M6281 Muscle weakness (generalized): Secondary | ICD-10-CM

## 2022-09-12 DIAGNOSIS — R262 Difficulty in walking, not elsewhere classified: Secondary | ICD-10-CM

## 2022-09-12 DIAGNOSIS — R2681 Unsteadiness on feet: Secondary | ICD-10-CM
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
## 2022-09-17 ENCOUNTER — Ambulatory Visit: Payer: Medicare (Managed Care)

## 2022-09-17 DIAGNOSIS — M6281 Muscle weakness (generalized): Secondary | ICD-10-CM

## 2022-09-17 DIAGNOSIS — M25552 Pain in left hip: Secondary | ICD-10-CM

## 2022-09-17 DIAGNOSIS — M542 Cervicalgia: Secondary | ICD-10-CM

## 2022-09-17 DIAGNOSIS — R29898 Other symptoms and signs involving the musculoskeletal system: Secondary | ICD-10-CM

## 2022-09-17 DIAGNOSIS — R262 Difficulty in walking, not elsewhere classified: Secondary | ICD-10-CM | POA: Diagnosis not present

## 2022-09-17 DIAGNOSIS — R2681 Unsteadiness on feet: Secondary | ICD-10-CM

## 2022-09-17 DIAGNOSIS — R2689 Other abnormalities of gait and mobility: Secondary | ICD-10-CM
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
## 2022-09-19 ENCOUNTER — Ambulatory Visit: Payer: Medicare (Managed Care)
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
## 2022-09-24 ENCOUNTER — Ambulatory Visit: Payer: Medicare (Managed Care)
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
## 2022-09-26 ENCOUNTER — Ambulatory Visit: Payer: Medicare (Managed Care)

## 2022-09-26 DIAGNOSIS — M25552 Pain in left hip: Secondary | ICD-10-CM

## 2022-09-26 DIAGNOSIS — R2681 Unsteadiness on feet: Secondary | ICD-10-CM

## 2022-09-26 DIAGNOSIS — M542 Cervicalgia: Secondary | ICD-10-CM

## 2022-09-26 DIAGNOSIS — R29898 Other symptoms and signs involving the musculoskeletal system: Secondary | ICD-10-CM

## 2022-09-26 DIAGNOSIS — R262 Difficulty in walking, not elsewhere classified: Secondary | ICD-10-CM

## 2022-09-26 DIAGNOSIS — M6281 Muscle weakness (generalized): Secondary | ICD-10-CM

## 2022-09-26 DIAGNOSIS — R2689 Other abnormalities of gait and mobility: Secondary | ICD-10-CM
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
## 2022-10-01 ENCOUNTER — Ambulatory Visit: Payer: Medicare (Managed Care)

## 2022-10-01 DIAGNOSIS — R262 Difficulty in walking, not elsewhere classified: Secondary | ICD-10-CM

## 2022-10-01 DIAGNOSIS — R29898 Other symptoms and signs involving the musculoskeletal system: Secondary | ICD-10-CM

## 2022-10-01 DIAGNOSIS — M542 Cervicalgia: Secondary | ICD-10-CM

## 2022-10-01 DIAGNOSIS — R2689 Other abnormalities of gait and mobility: Secondary | ICD-10-CM

## 2022-10-01 DIAGNOSIS — R2681 Unsteadiness on feet: Secondary | ICD-10-CM

## 2022-10-01 DIAGNOSIS — M6281 Muscle weakness (generalized): Secondary | ICD-10-CM

## 2022-10-01 DIAGNOSIS — M25552 Pain in left hip: Secondary | ICD-10-CM
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
## 2022-10-03 ENCOUNTER — Ambulatory Visit: Payer: Medicare (Managed Care)

## 2022-10-03 DIAGNOSIS — M25552 Pain in left hip: Secondary | ICD-10-CM

## 2022-10-03 DIAGNOSIS — R29898 Other symptoms and signs involving the musculoskeletal system: Secondary | ICD-10-CM

## 2022-10-03 DIAGNOSIS — M6281 Muscle weakness (generalized): Secondary | ICD-10-CM

## 2022-10-03 DIAGNOSIS — R262 Difficulty in walking, not elsewhere classified: Secondary | ICD-10-CM

## 2022-10-03 DIAGNOSIS — R2681 Unsteadiness on feet: Secondary | ICD-10-CM

## 2022-10-03 DIAGNOSIS — R2689 Other abnormalities of gait and mobility: Secondary | ICD-10-CM

## 2022-10-03 DIAGNOSIS — M542 Cervicalgia: Secondary | ICD-10-CM
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
## 2022-10-08 ENCOUNTER — Ambulatory Visit: Payer: Medicare (Managed Care)

## 2022-10-10 ENCOUNTER — Ambulatory Visit: Payer: Medicare (Managed Care) | Admitting: Physical Therapy

## 2022-10-15 ENCOUNTER — Ambulatory Visit: Payer: Medicare (Managed Care) | Attending: Neurology

## 2022-10-15 DIAGNOSIS — M25552 Pain in left hip: Secondary | ICD-10-CM | POA: Diagnosis present

## 2022-10-15 DIAGNOSIS — R262 Difficulty in walking, not elsewhere classified: Secondary | ICD-10-CM

## 2022-10-15 DIAGNOSIS — M6281 Muscle weakness (generalized): Secondary | ICD-10-CM | POA: Diagnosis present

## 2022-10-15 DIAGNOSIS — R2681 Unsteadiness on feet: Secondary | ICD-10-CM

## 2022-10-15 DIAGNOSIS — R2689 Other abnormalities of gait and mobility: Secondary | ICD-10-CM

## 2022-10-15 DIAGNOSIS — M542 Cervicalgia: Secondary | ICD-10-CM | POA: Diagnosis present

## 2022-10-15 DIAGNOSIS — R29898 Other symptoms and signs involving the musculoskeletal system: Secondary | ICD-10-CM | POA: Diagnosis present
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
## 2022-10-17 ENCOUNTER — Ambulatory Visit: Payer: Medicare (Managed Care)

## 2022-10-17 DIAGNOSIS — M542 Cervicalgia: Secondary | ICD-10-CM

## 2022-10-17 DIAGNOSIS — R2681 Unsteadiness on feet: Secondary | ICD-10-CM

## 2022-10-17 DIAGNOSIS — R29898 Other symptoms and signs involving the musculoskeletal system: Secondary | ICD-10-CM

## 2022-10-17 DIAGNOSIS — M6281 Muscle weakness (generalized): Secondary | ICD-10-CM

## 2022-10-17 DIAGNOSIS — R262 Difficulty in walking, not elsewhere classified: Secondary | ICD-10-CM

## 2022-10-17 DIAGNOSIS — M25552 Pain in left hip: Secondary | ICD-10-CM

## 2022-10-17 DIAGNOSIS — R2689 Other abnormalities of gait and mobility: Secondary | ICD-10-CM
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
## 2022-10-22 ENCOUNTER — Ambulatory Visit: Payer: Medicare (Managed Care)

## 2022-10-22 DIAGNOSIS — M542 Cervicalgia: Secondary | ICD-10-CM

## 2022-10-22 DIAGNOSIS — R29898 Other symptoms and signs involving the musculoskeletal system: Secondary | ICD-10-CM

## 2022-10-22 DIAGNOSIS — M25552 Pain in left hip: Secondary | ICD-10-CM

## 2022-10-22 DIAGNOSIS — R262 Difficulty in walking, not elsewhere classified: Secondary | ICD-10-CM

## 2022-10-22 DIAGNOSIS — R2689 Other abnormalities of gait and mobility: Secondary | ICD-10-CM

## 2022-10-22 DIAGNOSIS — M6281 Muscle weakness (generalized): Secondary | ICD-10-CM | POA: Diagnosis not present

## 2022-10-22 DIAGNOSIS — R2681 Unsteadiness on feet: Secondary | ICD-10-CM
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
## 2022-10-24 ENCOUNTER — Ambulatory Visit: Payer: Medicare (Managed Care)

## 2022-10-26 ENCOUNTER — Ambulatory Visit: Payer: Medicare (Managed Care) | Admitting: Physical Therapy

## 2022-10-26 DIAGNOSIS — R2689 Other abnormalities of gait and mobility: Secondary | ICD-10-CM

## 2022-10-26 DIAGNOSIS — M542 Cervicalgia: Secondary | ICD-10-CM

## 2022-10-26 DIAGNOSIS — R262 Difficulty in walking, not elsewhere classified: Secondary | ICD-10-CM

## 2022-10-26 DIAGNOSIS — M6281 Muscle weakness (generalized): Secondary | ICD-10-CM

## 2022-10-26 DIAGNOSIS — R29898 Other symptoms and signs involving the musculoskeletal system: Secondary | ICD-10-CM

## 2022-10-26 DIAGNOSIS — R2681 Unsteadiness on feet: Secondary | ICD-10-CM

## 2022-10-26 DIAGNOSIS — M25552 Pain in left hip: Secondary | ICD-10-CM

## 2022-10-26 NOTE — Patient Instructions (Signed)
  Proper body mechanics with getting out of a chair to decrease strain  on back &pelvic floor   Avoid holding your breath when Getting out of the chair:  Scoot to front part of chair chair Heels behind knees, feet are hip width apart, nose over toes  Inhale like you are smelling roses / smell soup  Exhale to stand   __   Avoid straining pelvic floor, abdominal muscles , spine  Use log rolling technique instead of getting out of bed with your neck or the sit-up     Log rolling into and out of bed   Log rolling into and out of bed If getting out of bed on R side, Bent knees, scoot hips/ shoulder to L  Raise R arm completely overhead, rolling onto armpit  Then lower bent knees to bed to get into complete side lying position  Then drop legs off bed, and push up onto R elbow/forearm, and use L hand to push onto the bed    Dig elbows and feet to lift hte buttocks and scoot without lifting head   __   Do the winging and brushing on both sides
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
## 2022-10-29 ENCOUNTER — Ambulatory Visit: Payer: Medicare (Managed Care)

## 2022-10-30 ENCOUNTER — Ambulatory Visit: Payer: Medicare (Managed Care) | Admitting: Physical Therapy

## 2022-10-31 ENCOUNTER — Ambulatory Visit: Payer: Medicare (Managed Care)

## 2022-11-02 ENCOUNTER — Ambulatory Visit: Payer: Medicare (Managed Care) | Attending: Neurology | Admitting: Physical Therapy

## 2022-11-02 DIAGNOSIS — R262 Difficulty in walking, not elsewhere classified: Secondary | ICD-10-CM | POA: Diagnosis present

## 2022-11-02 DIAGNOSIS — M6281 Muscle weakness (generalized): Secondary | ICD-10-CM | POA: Diagnosis present

## 2022-11-02 DIAGNOSIS — R29898 Other symptoms and signs involving the musculoskeletal system: Secondary | ICD-10-CM | POA: Diagnosis present

## 2022-11-02 DIAGNOSIS — M25552 Pain in left hip: Secondary | ICD-10-CM | POA: Insufficient documentation

## 2022-11-02 DIAGNOSIS — R2681 Unsteadiness on feet: Secondary | ICD-10-CM | POA: Diagnosis present

## 2022-11-02 DIAGNOSIS — M542 Cervicalgia: Secondary | ICD-10-CM

## 2022-11-02 DIAGNOSIS — R2689 Other abnormalities of gait and mobility: Secondary | ICD-10-CM

## 2022-11-02 NOTE — Patient Instructions (Signed)
Try the shoe lift in toe box and heel in R shoe ,  Remove is it causes pain  Keep cane lowered at wrist line
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
## 2022-11-05 ENCOUNTER — Ambulatory Visit: Payer: Medicare (Managed Care)

## 2022-11-06 ENCOUNTER — Ambulatory Visit: Payer: Medicare (Managed Care) | Admitting: Physical Therapy

## 2022-11-06 ENCOUNTER — Telehealth: Payer: Self-pay | Admitting: Physical Therapy

## 2022-11-06 NOTE — Telephone Encounter (Signed)
Physical therapist called pt re: missed appt today.   Left message to confirm next appt and to please call office if need to cancel in advance.

## 2022-11-07 ENCOUNTER — Ambulatory Visit: Payer: Medicare (Managed Care)

## 2022-11-12 ENCOUNTER — Ambulatory Visit: Payer: Medicare (Managed Care)

## 2022-11-13 ENCOUNTER — Ambulatory Visit: Payer: Medicare (Managed Care) | Admitting: Physical Therapy

## 2022-11-13 DIAGNOSIS — M6281 Muscle weakness (generalized): Secondary | ICD-10-CM

## 2022-11-13 DIAGNOSIS — R2681 Unsteadiness on feet: Secondary | ICD-10-CM

## 2022-11-13 DIAGNOSIS — R2689 Other abnormalities of gait and mobility: Secondary | ICD-10-CM

## 2022-11-13 DIAGNOSIS — R262 Difficulty in walking, not elsewhere classified: Secondary | ICD-10-CM

## 2022-11-13 DIAGNOSIS — M25552 Pain in left hip: Secondary | ICD-10-CM
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
## 2022-11-13 NOTE — Patient Instructions (Signed)
  Neck / shoulder stretches:    Lying on back - small sushi roll towel under neck  _ 6 directions   _angel wings, lower elbows down , keep arms touching bed  10 reps

## 2022-11-14 ENCOUNTER — Ambulatory Visit: Payer: Medicare (Managed Care)

## 2022-11-19 ENCOUNTER — Ambulatory Visit: Payer: Medicare (Managed Care)

## 2022-11-20 ENCOUNTER — Ambulatory Visit: Payer: Medicare (Managed Care) | Admitting: Physical Therapy

## 2022-11-21 ENCOUNTER — Ambulatory Visit: Payer: Medicare (Managed Care)

## 2022-11-21 ENCOUNTER — Ambulatory Visit: Payer: Medicare (Managed Care) | Admitting: Physical Therapy

## 2022-11-21 DIAGNOSIS — R29898 Other symptoms and signs involving the musculoskeletal system: Secondary | ICD-10-CM

## 2022-11-21 DIAGNOSIS — M25552 Pain in left hip: Secondary | ICD-10-CM

## 2022-11-21 DIAGNOSIS — R2681 Unsteadiness on feet: Secondary | ICD-10-CM

## 2022-11-21 DIAGNOSIS — M6281 Muscle weakness (generalized): Secondary | ICD-10-CM

## 2022-11-21 DIAGNOSIS — R262 Difficulty in walking, not elsewhere classified: Secondary | ICD-10-CM

## 2022-11-21 DIAGNOSIS — R2689 Other abnormalities of gait and mobility: Secondary | ICD-10-CM

## 2022-11-21 DIAGNOSIS — M542 Cervicalgia: Secondary | ICD-10-CM

## 2022-11-21 NOTE — Patient Instructions (Addendum)
   Side steps with feet under hips with both hands on kitchen counter   L and R = 1 lap along counter  10 laps per day   __  3- foot tap  10 reps  Each side with one hand on kitchen counter   Hold onto wall   Slightly bend of standing knee, and keep hips above foot   [place] of opposite leg   taps to each direction and   back to spot under hips- notice equal pressure through both legs, and across ballmound and heels   ___   Walking with higher knees, lower at ballmounds  not heels  ___
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
## 2022-11-26 ENCOUNTER — Ambulatory Visit: Payer: Medicare (Managed Care) | Admitting: Physical Therapy

## 2022-11-26 DIAGNOSIS — R262 Difficulty in walking, not elsewhere classified: Secondary | ICD-10-CM

## 2022-11-26 DIAGNOSIS — M25552 Pain in left hip: Secondary | ICD-10-CM

## 2022-11-26 DIAGNOSIS — M6281 Muscle weakness (generalized): Secondary | ICD-10-CM | POA: Diagnosis not present

## 2022-11-26 DIAGNOSIS — R2689 Other abnormalities of gait and mobility: Secondary | ICD-10-CM

## 2022-11-26 DIAGNOSIS — R2681 Unsteadiness on feet: Secondary | ICD-10-CM

## 2022-11-26 DIAGNOSIS — R29898 Other symptoms and signs involving the musculoskeletal system: Secondary | ICD-10-CM

## 2022-11-26 DIAGNOSIS — M542 Cervicalgia: Secondary | ICD-10-CM
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
## 2022-11-26 NOTE — Patient Instructions (Addendum)
Stretch for pelvic floor    On belly: Riding horse edge of mattress  knee bent like riding a horse, move knee towards armpit and out  10 reps  __  Childs pose rocking   Toes tucked, shoulders down and back, on forearms , hands shoulder width apart, fingers straight, elbow back , squeeze imaginary pencils in armpit, shoulder down and away from ears  10 reps   

## 2022-11-28 ENCOUNTER — Ambulatory Visit: Payer: Medicare (Managed Care)

## 2022-12-03 ENCOUNTER — Ambulatory Visit: Payer: Medicare (Managed Care) | Attending: Neurology | Admitting: Physical Therapy

## 2022-12-03 DIAGNOSIS — R29898 Other symptoms and signs involving the musculoskeletal system: Secondary | ICD-10-CM | POA: Insufficient documentation

## 2022-12-03 DIAGNOSIS — R2689 Other abnormalities of gait and mobility: Secondary | ICD-10-CM | POA: Diagnosis present

## 2022-12-03 DIAGNOSIS — R262 Difficulty in walking, not elsewhere classified: Secondary | ICD-10-CM | POA: Insufficient documentation

## 2022-12-03 DIAGNOSIS — M6281 Muscle weakness (generalized): Secondary | ICD-10-CM | POA: Diagnosis not present

## 2022-12-03 DIAGNOSIS — R2681 Unsteadiness on feet: Secondary | ICD-10-CM | POA: Diagnosis present

## 2022-12-03 DIAGNOSIS — M25552 Pain in left hip: Secondary | ICD-10-CM | POA: Diagnosis present

## 2022-12-03 DIAGNOSIS — M542 Cervicalgia: Secondary | ICD-10-CM | POA: Insufficient documentation
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
## 2022-12-04 NOTE — Patient Instructions (Signed)
Remove shoe lift.,   Take longer steps, bend at ankle, push off   Do not over do her biking ( limit to 10 min to 3 different times of the day)  Side stepping walking around counter ( L  and R direction = 1 lap)

## 2022-12-05 ENCOUNTER — Ambulatory Visit: Payer: Medicare (Managed Care)

## 2022-12-10 ENCOUNTER — Ambulatory Visit: Payer: Medicare (Managed Care) | Admitting: Physical Therapy

## 2022-12-12 ENCOUNTER — Ambulatory Visit: Payer: Medicare (Managed Care)

## 2022-12-17 ENCOUNTER — Ambulatory Visit: Payer: Medicare (Managed Care) | Admitting: Physical Therapy

## 2022-12-17 VITALS — BP 160/76

## 2022-12-17 DIAGNOSIS — M6281 Muscle weakness (generalized): Secondary | ICD-10-CM

## 2022-12-17 DIAGNOSIS — R262 Difficulty in walking, not elsewhere classified: Secondary | ICD-10-CM

## 2022-12-17 DIAGNOSIS — M25552 Pain in left hip: Secondary | ICD-10-CM

## 2022-12-17 DIAGNOSIS — M542 Cervicalgia: Secondary | ICD-10-CM

## 2022-12-17 DIAGNOSIS — R2689 Other abnormalities of gait and mobility: Secondary | ICD-10-CM

## 2022-12-17 DIAGNOSIS — R29898 Other symptoms and signs involving the musculoskeletal system: Secondary | ICD-10-CM

## 2022-12-17 DIAGNOSIS — R2681 Unsteadiness on feet: Secondary | ICD-10-CM
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
## 2022-12-18 ENCOUNTER — Ambulatory Visit: Payer: Medicare (Managed Care) | Admitting: Physical Therapy

## 2022-12-18 DIAGNOSIS — R2681 Unsteadiness on feet: Secondary | ICD-10-CM

## 2022-12-18 DIAGNOSIS — R2689 Other abnormalities of gait and mobility: Secondary | ICD-10-CM

## 2022-12-18 DIAGNOSIS — R262 Difficulty in walking, not elsewhere classified: Secondary | ICD-10-CM

## 2022-12-18 DIAGNOSIS — M542 Cervicalgia: Secondary | ICD-10-CM

## 2022-12-18 DIAGNOSIS — M6281 Muscle weakness (generalized): Secondary | ICD-10-CM

## 2022-12-18 DIAGNOSIS — M25552 Pain in left hip: Secondary | ICD-10-CM

## 2022-12-18 DIAGNOSIS — R29898 Other symptoms and signs involving the musculoskeletal system: Secondary | ICD-10-CM

## 2022-12-18 NOTE — Therapy (Signed)
  OUTPATIENT PHYSICAL THERAPY NOTE   Patient Name: Kim Ruiz MRN: [id number] DOB:July 12, 1951, 72 y.o., female Today's Date: 12/18/2022  Pt reported she saw her MD yesterday and was referred to an orthopedist.    Deferred PT Tx today. BP was taken 180/ 80 mm Hg at 10:18am at beginning of session.   163/ 76 mm Hg   Pt reported she has not be consistently take her BP medication. She did not take her BP this morning.     Advised pt to be consistent with taking her BP medication daily and to monitor her BP.  Pt voiced understanding in agreement.   Mariane Masters, PT, DPT  Physical Therapist- [facility]  12/18/22, 12:53 PM

## 2022-12-19 ENCOUNTER — Ambulatory Visit: Payer: Medicare (Managed Care)

## 2022-12-24 ENCOUNTER — Ambulatory Visit: Payer: Medicare (Managed Care) | Admitting: Physical Therapy

## 2022-12-26 ENCOUNTER — Ambulatory Visit: Payer: Medicare (Managed Care) | Admitting: Physical Therapy

## 2022-12-26 ENCOUNTER — Ambulatory Visit: Payer: Medicare (Managed Care)

## 2022-12-27 ENCOUNTER — Ambulatory Visit: Payer: Medicare (Managed Care) | Admitting: Physical Therapy

## 2022-12-27 DIAGNOSIS — R2689 Other abnormalities of gait and mobility: Secondary | ICD-10-CM

## 2022-12-27 DIAGNOSIS — M542 Cervicalgia: Secondary | ICD-10-CM

## 2022-12-27 DIAGNOSIS — M6281 Muscle weakness (generalized): Secondary | ICD-10-CM

## 2022-12-27 DIAGNOSIS — R29898 Other symptoms and signs involving the musculoskeletal system: Secondary | ICD-10-CM

## 2022-12-27 DIAGNOSIS — R262 Difficulty in walking, not elsewhere classified: Secondary | ICD-10-CM

## 2022-12-27 DIAGNOSIS — M25552 Pain in left hip: Secondary | ICD-10-CM

## 2022-12-27 DIAGNOSIS — R2681 Unsteadiness on feet: Secondary | ICD-10-CM
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
## 2022-12-27 NOTE — Therapy (Deleted)
  OUTPATIENT PHYSICAL THERAPY NOTE   Patient Name: Kim Ruiz MRN: [id number] DOB:03/03/1951, 72 y.o., female Today's Date: 12/18/2022  Pt reported she saw her MD yesterday and was referred to an orthopedist.    Deferred PT Tx today. BP was taken 180/ 80 mm Hg at 10:18am at beginning of session.   163/ 76 mm Hg   Pt reported she has not be consistently take her BP medication. She did not take her BP this morning.     Advised pt to be consistent with taking her BP medication daily and to monitor her BP.  Pt voiced understanding in agreement.   Shin-Yiing Elli Groesbeck, PT, DPT  Physical Therapist- [place]  [facility]  12/18/22, 12:53 PM 

## 2022-12-27 NOTE — Patient Instructions (Addendum)
  [place], elbow back shoulders down squeezes 3 sec hold, 10 x reps   __    Clam Shell 45 Degrees  Lying with hips and knees bent 45, one pillow between knees and ankles. Heel together, toes apart like ballerina,  Lift knee with exhale while pressing heels together. Be sure pelvis does not roll backward. Do not arch back. Do 20 times, each leg, 2 times per day.   Complimentary stretch will be taught later

## 2022-12-31 ENCOUNTER — Ambulatory Visit: Payer: Medicare (Managed Care) | Admitting: Physical Therapy

## 2022-12-31 DIAGNOSIS — R262 Difficulty in walking, not elsewhere classified: Secondary | ICD-10-CM

## 2022-12-31 DIAGNOSIS — R2689 Other abnormalities of gait and mobility: Secondary | ICD-10-CM

## 2022-12-31 DIAGNOSIS — R29898 Other symptoms and signs involving the musculoskeletal system: Secondary | ICD-10-CM

## 2022-12-31 DIAGNOSIS — M6281 Muscle weakness (generalized): Secondary | ICD-10-CM

## 2022-12-31 DIAGNOSIS — M25552 Pain in left hip: Secondary | ICD-10-CM

## 2022-12-31 DIAGNOSIS — M542 Cervicalgia: Secondary | ICD-10-CM

## 2022-12-31 DIAGNOSIS — R2681 Unsteadiness on feet: Secondary | ICD-10-CM

## 2022-12-31 NOTE — Patient Instructions (Addendum)
  Use log rolling technique instead of getting out of bed with your neck or the sit-up     Log rolling into and out of bed   Log rolling into and out of bed If getting out of bed on R side, Bent knees, scoot hips/ shoulder to L  Raise R arm completely overhead, rolling onto armpit  Then lower bent knees to bed to get into complete side lying position  Then drop legs off bed, and push up onto R elbow/forearm, and use L hand to push onto the bed    Dig elbows and feet to lift hte buttocks and scoot without lifting head   __  One sided push up , elbow aby rib and shoulder down   15 reps  Each side seated  ___   Use long towel under R tight, scoot hip to the R, press, head, elbows, shoulders L foot down, inhale, exhale lift R knee up and over to L armpit direction.  10 reps  Other side to loosen back of hips ( SIJ)    __
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
## 2023-01-02 ENCOUNTER — Ambulatory Visit: Payer: Medicare (Managed Care)

## 2023-01-02 ENCOUNTER — Ambulatory Visit: Payer: Medicare (Managed Care) | Attending: Neurology

## 2023-01-02 DIAGNOSIS — R262 Difficulty in walking, not elsewhere classified: Secondary | ICD-10-CM | POA: Insufficient documentation

## 2023-01-02 DIAGNOSIS — R2681 Unsteadiness on feet: Secondary | ICD-10-CM | POA: Insufficient documentation

## 2023-01-02 DIAGNOSIS — M6281 Muscle weakness (generalized): Secondary | ICD-10-CM

## 2023-01-02 DIAGNOSIS — R2689 Other abnormalities of gait and mobility: Secondary | ICD-10-CM | POA: Diagnosis present

## 2023-01-02 DIAGNOSIS — R29898 Other symptoms and signs involving the musculoskeletal system: Secondary | ICD-10-CM | POA: Diagnosis present

## 2023-01-02 DIAGNOSIS — M25552 Pain in left hip: Secondary | ICD-10-CM | POA: Diagnosis present

## 2023-01-02 DIAGNOSIS — M542 Cervicalgia: Secondary | ICD-10-CM | POA: Insufficient documentation

## 2023-01-07 ENCOUNTER — Ambulatory Visit: Payer: Medicare (Managed Care) | Admitting: Physical Therapy

## 2023-01-09 ENCOUNTER — Ambulatory Visit: Payer: Medicare (Managed Care)

## 2023-01-14 ENCOUNTER — Ambulatory Visit: Payer: Medicare (Managed Care) | Admitting: Physical Therapy

## 2023-01-14 DIAGNOSIS — M6281 Muscle weakness (generalized): Secondary | ICD-10-CM

## 2023-01-14 DIAGNOSIS — M25552 Pain in left hip: Secondary | ICD-10-CM | POA: Diagnosis not present

## 2023-01-14 DIAGNOSIS — R29898 Other symptoms and signs involving the musculoskeletal system: Secondary | ICD-10-CM

## 2023-01-14 DIAGNOSIS — R2689 Other abnormalities of gait and mobility: Secondary | ICD-10-CM

## 2023-01-14 DIAGNOSIS — R2681 Unsteadiness on feet: Secondary | ICD-10-CM

## 2023-01-14 DIAGNOSIS — M542 Cervicalgia: Secondary | ICD-10-CM

## 2023-01-14 DIAGNOSIS — R262 Difficulty in walking, not elsewhere classified: Secondary | ICD-10-CM

## 2023-01-16 ENCOUNTER — Ambulatory Visit: Payer: Medicare (Managed Care)

## 2023-01-21 ENCOUNTER — Ambulatory Visit: Payer: Medicare (Managed Care) | Admitting: Physical Therapy

## 2023-01-21 DIAGNOSIS — M25552 Pain in left hip: Secondary | ICD-10-CM | POA: Diagnosis not present

## 2023-01-21 DIAGNOSIS — R29898 Other symptoms and signs involving the musculoskeletal system: Secondary | ICD-10-CM

## 2023-01-21 DIAGNOSIS — M542 Cervicalgia: Secondary | ICD-10-CM

## 2023-01-21 DIAGNOSIS — R2689 Other abnormalities of gait and mobility: Secondary | ICD-10-CM

## 2023-01-21 DIAGNOSIS — R262 Difficulty in walking, not elsewhere classified: Secondary | ICD-10-CM

## 2023-01-21 DIAGNOSIS — M6281 Muscle weakness (generalized): Secondary | ICD-10-CM

## 2023-01-21 DIAGNOSIS — R2681 Unsteadiness on feet: Secondary | ICD-10-CM
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
## 2023-01-23 ENCOUNTER — Ambulatory Visit: Payer: Medicare (Managed Care)
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
## 2023-01-30 ENCOUNTER — Ambulatory Visit: Payer: Medicare (Managed Care)

## 2023-02-04 ENCOUNTER — Ambulatory Visit: Payer: Medicare (Managed Care) | Admitting: Physical Therapy

## 2023-02-04 ENCOUNTER — Ambulatory Visit: Payer: Medicare (Managed Care) | Attending: Neurology | Admitting: Physical Therapy

## 2023-02-04 DIAGNOSIS — R2689 Other abnormalities of gait and mobility: Secondary | ICD-10-CM | POA: Insufficient documentation

## 2023-02-04 DIAGNOSIS — M542 Cervicalgia: Secondary | ICD-10-CM | POA: Diagnosis present

## 2023-02-04 DIAGNOSIS — M25552 Pain in left hip: Secondary | ICD-10-CM | POA: Insufficient documentation

## 2023-02-04 DIAGNOSIS — R2681 Unsteadiness on feet: Secondary | ICD-10-CM | POA: Diagnosis present

## 2023-02-04 DIAGNOSIS — M533 Sacrococcygeal disorders, not elsewhere classified: Secondary | ICD-10-CM | POA: Insufficient documentation

## 2023-02-04 DIAGNOSIS — M6281 Muscle weakness (generalized): Secondary | ICD-10-CM | POA: Diagnosis present

## 2023-02-04 DIAGNOSIS — R29898 Other symptoms and signs involving the musculoskeletal system: Secondary | ICD-10-CM | POA: Diagnosis present

## 2023-02-04 DIAGNOSIS — R262 Difficulty in walking, not elsewhere classified: Secondary | ICD-10-CM | POA: Insufficient documentation
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
## 2023-02-04 NOTE — Patient Instructions (Signed)
Hand on counter:  Front knee above ankle, other foot back for lunge stretch  Both sides   __  Allow for L leg to relax off bed  5 min  __  Marching with hand on counter, higher knees, lower with ballmounds not heels  2 mins And stretch   2 sets

## 2023-02-07 ENCOUNTER — Ambulatory Visit: Payer: Medicare (Managed Care)

## 2023-02-11 ENCOUNTER — Ambulatory Visit: Payer: Medicare (Managed Care) | Admitting: Physical Therapy

## 2023-02-13 ENCOUNTER — Ambulatory Visit: Payer: Medicare (Managed Care) | Admitting: Physical Therapy

## 2023-02-13 DIAGNOSIS — M6281 Muscle weakness (generalized): Secondary | ICD-10-CM

## 2023-02-13 DIAGNOSIS — R2689 Other abnormalities of gait and mobility: Secondary | ICD-10-CM

## 2023-02-13 DIAGNOSIS — M25552 Pain in left hip: Secondary | ICD-10-CM

## 2023-02-13 DIAGNOSIS — R29898 Other symptoms and signs involving the musculoskeletal system: Secondary | ICD-10-CM

## 2023-02-13 DIAGNOSIS — R2681 Unsteadiness on feet: Secondary | ICD-10-CM

## 2023-02-13 DIAGNOSIS — M542 Cervicalgia: Secondary | ICD-10-CM

## 2023-02-14 ENCOUNTER — Ambulatory Visit: Payer: Medicare (Managed Care)
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
## 2023-02-18 ENCOUNTER — Ambulatory Visit: Payer: Medicare (Managed Care) | Admitting: Physical Therapy

## 2023-02-18 DIAGNOSIS — R29898 Other symptoms and signs involving the musculoskeletal system: Secondary | ICD-10-CM

## 2023-02-18 DIAGNOSIS — R262 Difficulty in walking, not elsewhere classified: Secondary | ICD-10-CM

## 2023-02-18 DIAGNOSIS — M25552 Pain in left hip: Secondary | ICD-10-CM

## 2023-02-18 DIAGNOSIS — M6281 Muscle weakness (generalized): Secondary | ICD-10-CM

## 2023-02-18 DIAGNOSIS — R2689 Other abnormalities of gait and mobility: Secondary | ICD-10-CM

## 2023-02-18 DIAGNOSIS — M542 Cervicalgia: Secondary | ICD-10-CM

## 2023-02-18 DIAGNOSIS — R2681 Unsteadiness on feet: Secondary | ICD-10-CM
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
## 2023-02-21 ENCOUNTER — Ambulatory Visit: Payer: Medicare (Managed Care)

## 2023-02-25 ENCOUNTER — Ambulatory Visit: Payer: Medicare (Managed Care) | Admitting: Physical Therapy

## 2023-02-25 DIAGNOSIS — R262 Difficulty in walking, not elsewhere classified: Secondary | ICD-10-CM

## 2023-02-25 DIAGNOSIS — M6281 Muscle weakness (generalized): Secondary | ICD-10-CM

## 2023-02-25 DIAGNOSIS — M542 Cervicalgia: Secondary | ICD-10-CM

## 2023-02-25 DIAGNOSIS — M25552 Pain in left hip: Secondary | ICD-10-CM | POA: Diagnosis not present

## 2023-02-25 DIAGNOSIS — R29898 Other symptoms and signs involving the musculoskeletal system: Secondary | ICD-10-CM

## 2023-02-25 DIAGNOSIS — R2681 Unsteadiness on feet: Secondary | ICD-10-CM

## 2023-02-25 DIAGNOSIS — R2689 Other abnormalities of gait and mobility: Secondary | ICD-10-CM
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
## 2023-02-28 ENCOUNTER — Ambulatory Visit: Payer: Medicare (Managed Care)

## 2023-03-11 ENCOUNTER — Ambulatory Visit: Payer: Medicare (Managed Care) | Attending: Neurology | Admitting: Physical Therapy

## 2023-03-11 DIAGNOSIS — R2689 Other abnormalities of gait and mobility: Secondary | ICD-10-CM | POA: Diagnosis present

## 2023-03-11 DIAGNOSIS — R29898 Other symptoms and signs involving the musculoskeletal system: Secondary | ICD-10-CM | POA: Insufficient documentation

## 2023-03-11 DIAGNOSIS — R2681 Unsteadiness on feet: Secondary | ICD-10-CM | POA: Insufficient documentation

## 2023-03-11 DIAGNOSIS — M6281 Muscle weakness (generalized): Secondary | ICD-10-CM | POA: Diagnosis present

## 2023-03-11 DIAGNOSIS — R262 Difficulty in walking, not elsewhere classified: Secondary | ICD-10-CM | POA: Insufficient documentation

## 2023-03-11 DIAGNOSIS — M542 Cervicalgia: Secondary | ICD-10-CM | POA: Insufficient documentation

## 2023-03-11 DIAGNOSIS — M25552 Pain in left hip: Secondary | ICD-10-CM | POA: Insufficient documentation

## 2023-03-11 NOTE — Patient Instructions (Addendum)
Stretches : (Cuing provided for proper alignment)  Instructions start with Strap on L       Quad in sidelying on your R side _strap around the L  ankle, pulling ankle towards buttocks  Bottom leg firm and stabilization with knee bent  Adductors and pelvic floor ( [place] Baby) : Esmond Harps are wide towards armpits, sole of feet towards ceiling     IT band  _scoot hips to R, cross R leg over L and straighten knee with strap on ballmound,   Bend knee back and forth 5x  __    High knees with walking    2 min marching in place to land on ballmounds
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
## 2023-03-18 ENCOUNTER — Ambulatory Visit: Payer: Medicare (Managed Care) | Admitting: Physical Therapy

## 2023-03-18 DIAGNOSIS — R2681 Unsteadiness on feet: Secondary | ICD-10-CM

## 2023-03-18 DIAGNOSIS — M542 Cervicalgia: Secondary | ICD-10-CM

## 2023-03-18 DIAGNOSIS — M25552 Pain in left hip: Secondary | ICD-10-CM | POA: Diagnosis not present

## 2023-03-18 DIAGNOSIS — R29898 Other symptoms and signs involving the musculoskeletal system: Secondary | ICD-10-CM

## 2023-03-18 DIAGNOSIS — R2689 Other abnormalities of gait and mobility: Secondary | ICD-10-CM

## 2023-03-18 DIAGNOSIS — R262 Difficulty in walking, not elsewhere classified: Secondary | ICD-10-CM

## 2023-03-18 DIAGNOSIS — M6281 Muscle weakness (generalized): Secondary | ICD-10-CM

## 2023-03-18 NOTE — Patient Instructions (Addendum)
  SKI against chair   In front of chair, knee against seat to line knee above ankle,  back foot hip width apart, push off through ballmounds,  opp arm up, thumbs up, chin tuck, shoulders down  Push off through ballmounds , step back to under hip width   3 min   __  Standing marches, look ahead  Tap your thigh ,  Wide feet    2 min   ___  Shoe lift in R
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
## 2023-03-25 ENCOUNTER — Ambulatory Visit: Payer: Medicare (Managed Care) | Admitting: Physical Therapy

## 2023-04-03 ENCOUNTER — Ambulatory Visit: Payer: Medicare (Managed Care) | Admitting: Physical Therapy

## 2023-04-03 DIAGNOSIS — R2689 Other abnormalities of gait and mobility: Secondary | ICD-10-CM

## 2023-04-03 DIAGNOSIS — M6281 Muscle weakness (generalized): Secondary | ICD-10-CM

## 2023-04-03 DIAGNOSIS — M542 Cervicalgia: Secondary | ICD-10-CM

## 2023-04-03 DIAGNOSIS — R2681 Unsteadiness on feet: Secondary | ICD-10-CM

## 2023-04-03 DIAGNOSIS — R29898 Other symptoms and signs involving the musculoskeletal system: Secondary | ICD-10-CM

## 2023-04-03 DIAGNOSIS — R262 Difficulty in walking, not elsewhere classified: Secondary | ICD-10-CM

## 2023-04-03 DIAGNOSIS — M25552 Pain in left hip: Secondary | ICD-10-CM
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
## 2023-04-16 ENCOUNTER — Other Ambulatory Visit: Payer: Self-pay | Admitting: Family Medicine

## 2023-04-16 DIAGNOSIS — Z1231 Encounter for screening mammogram for malignant neoplasm of breast: Secondary | ICD-10-CM

## 2023-04-25 ENCOUNTER — Ambulatory Visit: Payer: Medicare HMO | Admitting: Physical Therapy

## 2023-04-26 ENCOUNTER — Ambulatory Visit
Admission: RE | Admit: 2023-04-26 | Discharge: 2023-04-26 | Disposition: A | Discharge status: Home / Self Care | Payer: Medicare HMO | Source: Ambulatory Visit | Attending: Family Medicine | Admitting: Family Medicine

## 2023-04-26 DIAGNOSIS — Z1231 Encounter for screening mammogram for malignant neoplasm of breast: Secondary | ICD-10-CM | POA: Insufficient documentation

## 2023-05-03 ENCOUNTER — Other Ambulatory Visit: Payer: Self-pay | Admitting: Family Medicine

## 2023-05-03 DIAGNOSIS — R928 Other abnormal and inconclusive findings on diagnostic imaging of breast: Secondary | ICD-10-CM

## 2023-05-03 DIAGNOSIS — R921 Mammographic calcification found on diagnostic imaging of breast: Secondary | ICD-10-CM

## 2023-05-05 DIAGNOSIS — D0511 Intraductal carcinoma in situ of right breast: Secondary | ICD-10-CM

## 2023-05-05 HISTORY — DX: Intraductal carcinoma in situ of right breast: D05.11

## 2023-05-10 ENCOUNTER — Encounter: Payer: Self-pay | Admitting: Family Medicine

## 2023-05-10 ENCOUNTER — Ambulatory Visit
Admission: RE | Admit: 2023-05-10 | Discharge: 2023-05-10 | Disposition: A | Discharge status: Home / Self Care | Payer: Medicare HMO | Source: Ambulatory Visit | Attending: Family Medicine | Admitting: Family Medicine

## 2023-05-10 DIAGNOSIS — R921 Mammographic calcification found on diagnostic imaging of breast: Secondary | ICD-10-CM | POA: Diagnosis present

## 2023-05-10 DIAGNOSIS — R928 Other abnormal and inconclusive findings on diagnostic imaging of breast: Secondary | ICD-10-CM | POA: Insufficient documentation

## 2023-05-14 ENCOUNTER — Other Ambulatory Visit: Payer: Self-pay | Admitting: Family Medicine

## 2023-05-14 DIAGNOSIS — R921 Mammographic calcification found on diagnostic imaging of breast: Secondary | ICD-10-CM

## 2023-05-14 DIAGNOSIS — R928 Other abnormal and inconclusive findings on diagnostic imaging of breast: Secondary | ICD-10-CM

## 2023-05-20 ENCOUNTER — Ambulatory Visit
Admission: RE | Admit: 2023-05-20 | Discharge: 2023-05-20 | Disposition: A | Discharge status: Home / Self Care | Payer: Medicare HMO | Source: Ambulatory Visit | Attending: Family Medicine | Admitting: Family Medicine

## 2023-05-20 DIAGNOSIS — D0511 Intraductal carcinoma in situ of right breast: Secondary | ICD-10-CM | POA: Insufficient documentation

## 2023-05-20 DIAGNOSIS — R921 Mammographic calcification found on diagnostic imaging of breast: Secondary | ICD-10-CM

## 2023-05-20 DIAGNOSIS — R928 Other abnormal and inconclusive findings on diagnostic imaging of breast: Secondary | ICD-10-CM | POA: Diagnosis present

## 2023-05-20 HISTORY — PX: BREAST BIOPSY: SHX20

## 2023-05-20 MED ORDER — LIDOCAINE 1 % OPTIME INJ - NO CHARGE
5.0000 mL | Freq: Once | INTRAMUSCULAR | Status: AC
  Administered 2023-05-20: 5 mL
  Filled 2023-05-20: qty 6

## 2023-05-20 MED ORDER — LIDOCAINE-EPINEPHRINE 1 %-1:100000 IJ SOLN
20.0000 mL | Freq: Once | INTRAMUSCULAR | Status: AC
  Administered 2023-05-20: 20 mL
  Filled 2023-05-20: qty 20

## 2023-05-21 ENCOUNTER — Encounter: Payer: Self-pay | Admitting: *Deleted

## 2023-05-21 DIAGNOSIS — D0511 Intraductal carcinoma in situ of right breast: Secondary | ICD-10-CM

## 2023-05-21 LAB — SURGICAL PATHOLOGY

## 2023-05-21 NOTE — Progress Notes (Signed)
Received referral for newly diagnosed breast cancer from [facility].  Navigation initiated.  She will see Dr. Cathie Hoops tomorrow at 11:15.  She will see Dr. Maia Plan on Thursday at 9:30.

## 2023-05-22 ENCOUNTER — Inpatient Hospital Stay: Payer: Medicare HMO

## 2023-05-22 ENCOUNTER — Inpatient Hospital Stay: Payer: Medicare HMO | Admitting: Oncology

## 2023-05-23 ENCOUNTER — Ambulatory Visit: Payer: Self-pay | Admitting: General Surgery

## 2023-05-23 ENCOUNTER — Other Ambulatory Visit: Payer: Self-pay | Admitting: General Surgery

## 2023-05-23 DIAGNOSIS — C50411 Malignant neoplasm of upper-outer quadrant of right female breast: Secondary | ICD-10-CM

## 2023-05-23 NOTE — H&P (View-Only) (Signed)
PATIENT PROFILE: Kim Ruiz is a 72 y.o. female who presents to the Clinic for consultation at the request of Dr. Okey Dupre for evaluation of right breast cancer.  PCP:  Casimer Leek, NP  HISTORY OF PRESENT ILLNESS: Kim Ruiz reports 1 for evaluation of her routine screening mammogram.  She was found with calcification in the screening mammogram.  This led to diagnostic mammogram.  Diagnostic mammogram confirmed 6 mm area of concerning calcifications.  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  Patient denies any previous breast pain, palpable masses, nipple discharge.  Family history of breast cancer: Sister Family history of other cancers: Multiple myeloma Menarche: 13 Menopause: Hysterectomy in her 51s Used estrogen and progesterone therapy: Denies Number of pregnancies: 1 miscarriage at age 17 History of Radiation to the chest: None Previous breast biopsy: None  PROBLEM LIST: Problem List  Date Reviewed: 10/17/2022          Noted   Benign neoplasm of sigmoid colon 11/09/2021   Benign neoplasm of ascending colon 11/09/2021   Special screening for malignant neoplasms, colon 11/09/2021   Abnormal gait 02/29/2020   Knee stiff 02/29/2020   Knee pain 02/29/2020   Tear of medial meniscus of knee 02/29/2020   Bacterial conjunctivitis 11/11/2019   Strain of muscle, fascia and tendon of unspecified hip, initial encounter 06/03/2019   Proteinuria 09/22/2018   Family history of multiple myeloma 09/02/2017   Hematuria 08/05/2017   Other stressful life events affecting family and household 04/25/2017   Encounter for screening for lipoid disorders 12/10/2016   Screening for osteoporosis 12/10/2016   Procedure and treatment not carried out because of patient's decision for reasons of belief and group pressure 12/10/2016   Paresthesia of skin 06/19/2016   Overview    plantar right foot       Bilateral primary osteoarthritis of hip  04/10/2016   Long term (current) use of non-steroidal anti-inflammatories (nsaid) 04/10/2016   Hip pain, left 02/28/2016   Endocarditis, 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   Pain of lower extremity 07/06/2015   Pain in joint of right shoulder 03/30/2015   Benign lipomatous neoplasm of skin and subcutaneous tissue of unspecified sites 09/08/2014   Other abnormal glucose 06/25/2014   Polyp of colon 04/01/2014   Overview    Colonoscopy 2007       Abnormal mammogram 02/12/2013   Fatigue 12/15/2012   Hypertension 12/15/2012    GENERAL REVIEW OF SYSTEMS:   General ROS: negative for - chills, fatigue, fever, weight gain or weight loss Allergy and Immunology ROS: negative for - hives  Hematological and Lymphatic ROS: negative for - bleeding problems or bruising, negative for palpable nodes Endocrine ROS: negative for - heat or cold intolerance, hair changes Respiratory ROS: negative for - cough, shortness of breath or wheezing Cardiovascular ROS: no chest pain or palpitations GI ROS: negative for nausea, vomiting, abdominal pain, diarrhea, constipation Musculoskeletal ROS: negative for - joint swelling or muscle pain Neurological ROS: negative for - confusion, syncope Dermatological ROS: negative for pruritus and rash Psychiatric: negative for anxiety, depression, difficulty sleeping and memory loss  MEDICATIONS: Current Outpatient Medications  Medication Sig Dispense Refill   amLODIPine (NORVASC) 5 MG tablet TAKE 1 TABLET(5 MG) BY MOUTH EVERY DAY 30 tablet 11   atorvastatin (LIPITOR) 20 MG tablet TAKE 1 TABLET BY MOUTH AT BEDTIME FOR HIGH CHOLESTEROL     ibuprofen (MOTRIN) 600 MG tablet Take 600 mg by mouth every 6 (six) hours as needed  lidocaine (LIDODERM) 5 % patch APPLY 1 PATCH ONTO SKIN AS DIRECTED EVERY DAY FOR 12 HOURS THEN REMOVE FOR 12 HOURS BEFORE NEXT USE     multivitamin tablet Take 1 tablet by mouth once daily     traMADoL (ULTRAM) 50 mg tablet Take 1 tablet by mouth every 6 (six) hours      turmeric, bulk, 100 % Powd Use once daily.     calcium carbonate-vitamin D3 (CALTRATE 600+D) 600 mg-10 mcg (400 unit) tablet once a week (Patient not taking: Reported on 05/23/2023)     celecoxib (CELEBREX) 100 MG capsule  (Patient not taking: Reported on 10/17/2022)     diclofenac (VOLTAREN) 75 MG EC tablet  (Patient not taking: Reported on 10/17/2022)     gabapentin (NEURONTIN) 600 MG tablet Take 600mg  once daily as needed for head/neck pain. Do not take with other sedating medications. (Patient not taking: Reported on 05/23/2023) 30 tablet 4   glucosamine-D3-Boswellia serr 1,500-400-100 mg-unit-mg Tab Take by mouth (Patient not taking: Reported on 05/23/2023)     HYDROcodone-acetaminophen (NORCO) 5-325 mg tablet  (Patient not taking: Reported on 05/23/2023)     HYDROmorphone (DILAUDID) 2 MG tablet every 8 (eight) hours (Patient not taking: Reported on 05/23/2023)     valACYclovir (VALTREX) 1000 MG tablet  (Patient not taking: Reported on 05/23/2023)     No current facility-administered medications for this visit.    ALLERGIES: Patient has no known allergies.  PAST MEDICAL HISTORY: Past Medical History:  Diagnosis Date   Heart murmur    Hyperlipidemia    Hypertension     PAST SURGICAL HISTORY: Past Surgical History:  Procedure Laterality Date   knee surgery 2014 Dr. Katrinka Blazing Right      FAMILY HISTORY: Family History  Problem Relation Name Age of Onset   Myocardial Infarction (Heart attack) Mother       SOCIAL HISTORY: Social History   Socioeconomic History   Marital status: Divorced  Tobacco Use   Smoking status: Former    Current packs/day: 0.00    Types: Cigarettes    Quit date: 1989    Years since quitting: 35.7   Smokeless tobacco: Never    PHYSICAL EXAM: Vitals:   05/23/23 0924  BP: (!) 147/83  Pulse: 73   Body mass index is 27.1 kg/m. Weight: 78.5 kg (173 lb 1 oz)   GENERAL: Alert, active, oriented x3  HEENT: Pupils equal reactive to light. Extraocular  movements are intact. Sclera clear. Palpebral conjunctiva normal red color.Pharynx clear.  NECK: Supple with no palpable mass and no adenopathy.  LUNGS: Sound clear with no rales rhonchi or wheezes.  HEART: Regular rhythm S1 and S2 without murmur.  BREAST: breasts appear normal, no suspicious masses, no skin or nipple changes or axillary nodes.  ABDOMEN: Soft and depressible, nontender with no palpable mass, no hepatomegaly.  EXTREMITIES: Well-developed well-nourished symmetrical with no dependent edema.  NEUROLOGICAL: Awake alert oriented, facial expression symmetrical, moving all extremities.  REVIEW OF DATA: I have reviewed the following data today: No visits with results within 3 Month(s) from this visit.  Latest known visit with results is:  Initial consult on 04/30/2022  Component Date Value   Vitamin B12 04/30/2022 423    Thyroid Stimulating Horm* 04/30/2022 2.588    WBC (White Blood Cell Co* 04/30/2022 8.4    RBC (Red Blood Cell Coun* 04/30/2022 5.33    Hemoglobin 04/30/2022 13.6    Hematocrit 04/30/2022 43.4    MCV (Mean Corpuscular Vo* 04/30/2022 81.4  MCH (Mean Corpuscular He* 04/30/2022 25.5 (L)    MCHC (Mean Corpuscular H* 04/30/2022 31.3 (L)    Platelet Count 04/30/2022 231    RDW-CV (Red Cell Distrib* 04/30/2022 13.4    MPV (Mean Platelet Volum* 04/30/2022 10.1    Glucose 04/30/2022 78    Sodium 04/30/2022 142    Potassium 04/30/2022 4.1    Chloride 04/30/2022 108    Carbon Dioxide (CO2) 04/30/2022 27.9    Urea Nitrogen (BUN) 04/30/2022 14    Creatinine 04/30/2022 0.8    Glomerular Filtration Ra* 04/30/2022 85    Calcium 04/30/2022 11.1 (H)    AST  04/30/2022 18    ALT  04/30/2022 16    Alk Phos (alkaline Phosp* 04/30/2022 131 (H)    Albumin 04/30/2022 4.6    Bilirubin, Total 04/30/2022 0.6    Protein, Total 04/30/2022 7.3    A/G Ratio 04/30/2022 1.7    Hemoglobin A1C 04/30/2022 6.0 (H)    Average Blood Glucose (C* 04/30/2022 126       ASSESSMENT: Ms. Wilker is a 72 y.o. female presenting for consultation for ductal carcinoma in situ, high-grade with clear necrosis.    Patient was oriented again about the pathology results. Surgical alternatives were discussed with patient including partial vs total mastectomy. Surgical technique and post operative care was discussed with patient. Risk of surgery was discussed with patient including but not limited to: wound infection, seroma, hematoma, brachial plexopathy, mondor's disease (thrombosis of small veins of breast), chronic wound pain, breast lymphedema, altered sensation to the nipple and cosmesis among others.   Due to small size, 6 mm of ductal carcinoma in situ patient elected to proceed with partial mastectomy.  Due to the upper outer quadrant location and being high-grade with comedonecrosis we discussed about the adding sentinel biopsy in case this is upgraded to invasive mammary carcinoma.  The patient would like to proceed with sentinel node biopsy.  Malignant neoplasm of upper-outer quadrant of right breast in female, estrogen receptor positive (CMS/HHS-HCC) [C50.411, Z17.0]  PLAN: 1.  Right breast with frequency tag partial mastectomy with axillary sentinel lymph node biopsy ([id number], [id number]) 2. Contact us if you have any concern.   Patient verbalized understanding, all questions were answered, and were agreeable with the plan outlined above.    Carolan Shiver, MD  Electronically signed by Carolan Shiver, MD

## 2023-05-23 NOTE — H&P (Signed)
PATIENT PROFILE: Kimyatta Shinaberry is a 72 y.o. female who presents to the Clinic for consultation at the request of Dr. Okey Dupre for evaluation of right breast cancer.  PCP:  Casimer Leek, NP  HISTORY OF PRESENT ILLNESS: [name] reports 1 for evaluation of her routine screening mammogram.  She was found with calcification in the screening mammogram.  This led to diagnostic mammogram.  Diagnostic mammogram confirmed 6 mm area of concerning calcifications.  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  Patient denies any previous breast pain, palpable masses, nipple discharge.  Family history of breast cancer: Sister Family history of other cancers: Multiple myeloma Menarche: 13 Menopause: Hysterectomy in her 51s Used estrogen and progesterone therapy: Denies Number of pregnancies: 1 miscarriage at age 17 History of Radiation to the chest: None Previous breast biopsy: None  PROBLEM LIST: Problem List  Date Reviewed: 10/17/2022          Noted   Benign neoplasm of sigmoid colon 11/09/2021   Benign neoplasm of ascending colon 11/09/2021   Special screening for malignant neoplasms, colon 11/09/2021   Abnormal gait 02/29/2020   Knee stiff 02/29/2020   Knee pain 02/29/2020   Tear of medial meniscus of knee 02/29/2020   Bacterial conjunctivitis 11/11/2019   Strain of muscle, fascia and tendon of unspecified hip, initial encounter 06/03/2019   Proteinuria 09/22/2018   Family history of multiple myeloma 09/02/2017   Hematuria 08/05/2017   Other stressful life events affecting family and household 04/25/2017   Encounter for screening for lipoid disorders 12/10/2016   Screening for osteoporosis 12/10/2016   Procedure and treatment not carried out because of patient's decision for reasons of belief and group pressure 12/10/2016   Paresthesia of skin 06/19/2016   Overview    plantar right foot       Bilateral primary osteoarthritis of hip  04/10/2016   Long term (current) use of non-steroidal anti-inflammatories (nsaid) 04/10/2016   Hip pain, left 02/28/2016   Endocarditis, 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   Pain of lower extremity 07/06/2015   Pain in joint of right shoulder 03/30/2015   Benign lipomatous neoplasm of skin and subcutaneous tissue of unspecified sites 09/08/2014   Other abnormal glucose 06/25/2014   Polyp of colon 04/01/2014   Overview    Colonoscopy 2007       Abnormal mammogram 02/12/2013   Fatigue 12/15/2012   Hypertension 12/15/2012    GENERAL REVIEW OF SYSTEMS:   General ROS: negative for - chills, fatigue, fever, weight gain or weight loss Allergy and Immunology ROS: negative for - hives  Hematological and Lymphatic ROS: negative for - bleeding problems or bruising, negative for palpable nodes Endocrine ROS: negative for - heat or cold intolerance, hair changes Respiratory ROS: negative for - cough, shortness of breath or wheezing Cardiovascular ROS: no chest pain or palpitations GI ROS: negative for nausea, vomiting, abdominal pain, diarrhea, constipation Musculoskeletal ROS: negative for - joint swelling or muscle pain Neurological ROS: negative for - confusion, syncope Dermatological ROS: negative for pruritus and rash Psychiatric: negative for anxiety, depression, difficulty sleeping and memory loss  MEDICATIONS: Current Outpatient Medications  Medication Sig Dispense Refill   amLODIPine (NORVASC) 5 MG tablet TAKE 1 TABLET(5 MG) BY MOUTH EVERY DAY 30 tablet 11   atorvastatin (LIPITOR) 20 MG tablet TAKE 1 TABLET BY MOUTH AT BEDTIME FOR HIGH CHOLESTEROL     ibuprofen (MOTRIN) 600 MG tablet Take 600 mg by mouth every 6 (six) hours as needed  lidocaine (LIDODERM) 5 % patch APPLY 1 PATCH ONTO SKIN AS DIRECTED EVERY DAY FOR 12 HOURS THEN REMOVE FOR 12 HOURS BEFORE NEXT USE     multivitamin tablet Take 1 tablet by mouth once daily     traMADoL (ULTRAM) 50 mg tablet Take 1 tablet by mouth every 6 (six) hours      turmeric, bulk, 100 % Powd Use once daily.     calcium carbonate-vitamin D3 (CALTRATE 600+D) 600 mg-10 mcg (400 unit) tablet once a week (Patient not taking: Reported on 05/23/2023)     celecoxib (CELEBREX) 100 MG capsule  (Patient not taking: Reported on 10/17/2022)     diclofenac (VOLTAREN) 75 MG EC tablet  (Patient not taking: Reported on 10/17/2022)     gabapentin (NEURONTIN) 600 MG tablet Take 600mg  once daily as needed for head/neck pain. Do not take with other sedating medications. (Patient not taking: Reported on 05/23/2023) 30 tablet 4   glucosamine-D3-Boswellia serr 1,500-400-100 mg-unit-mg Tab Take by mouth (Patient not taking: Reported on 05/23/2023)     HYDROcodone-acetaminophen (NORCO) 5-325 mg tablet  (Patient not taking: Reported on 05/23/2023)     HYDROmorphone (DILAUDID) 2 MG tablet every 8 (eight) hours (Patient not taking: Reported on 05/23/2023)     valACYclovir (VALTREX) 1000 MG tablet  (Patient not taking: Reported on 05/23/2023)     No current facility-administered medications for this visit.    ALLERGIES: Patient has no known allergies.  PAST MEDICAL HISTORY: Past Medical History:  Diagnosis Date   Heart murmur    Hyperlipidemia    Hypertension     PAST SURGICAL HISTORY: Past Surgical History:  Procedure Laterality Date   knee surgery 2014 Dr. Katrinka Blazing Right      FAMILY HISTORY: Family History  Problem Relation Name Age of Onset   Myocardial Infarction (Heart attack) Mother       SOCIAL HISTORY: Social History   Socioeconomic History   Marital status: Divorced  Tobacco Use   Smoking status: Former    Current packs/day: 0.00    Types: Cigarettes    Quit date: 1989    Years since quitting: 35.7   Smokeless tobacco: Never    PHYSICAL EXAM: Vitals:   05/23/23 0924  BP: (!) 147/83  Pulse: 73   Body mass index is 27.1 kg/m. Weight: 78.5 kg (173 lb 1 oz)   GENERAL: Alert, active, oriented x3  HEENT: Pupils equal reactive to light. Extraocular  movements are intact. Sclera clear. Palpebral conjunctiva normal red color.Pharynx clear.  NECK: Supple with no palpable mass and no adenopathy.  LUNGS: Sound clear with no rales rhonchi or wheezes.  HEART: Regular rhythm S1 and S2 without murmur.  BREAST: breasts appear normal, no suspicious masses, no skin or nipple changes or axillary nodes.  ABDOMEN: Soft and depressible, nontender with no palpable mass, no hepatomegaly.  EXTREMITIES: Well-developed well-nourished symmetrical with no dependent edema.  NEUROLOGICAL: Awake alert oriented, facial expression symmetrical, moving all extremities.  REVIEW OF DATA: I have reviewed the following data today: No visits with results within 3 Month(s) from this visit.  Latest known visit with results is:  Initial consult on 04/30/2022  Component Date Value   Vitamin B12 04/30/2022 423    Thyroid Stimulating Horm* 04/30/2022 2.588    WBC (White Blood Cell Co* 04/30/2022 8.4    RBC (Red Blood Cell Coun* 04/30/2022 5.33    Hemoglobin 04/30/2022 13.6    Hematocrit 04/30/2022 43.4    MCV (Mean Corpuscular Vo* 04/30/2022 81.4  MCH (Mean Corpuscular He* 04/30/2022 25.5 (L)    MCHC (Mean Corpuscular H* 04/30/2022 31.3 (L)    Platelet Count 04/30/2022 231    RDW-CV (Red Cell Distrib* 04/30/2022 13.4    MPV (Mean Platelet Volum* 04/30/2022 10.1    Glucose 04/30/2022 78    Sodium 04/30/2022 142    Potassium 04/30/2022 4.1    Chloride 04/30/2022 108    Carbon Dioxide (CO2) 04/30/2022 27.9    Urea Nitrogen (BUN) 04/30/2022 14    Creatinine 04/30/2022 0.8    Glomerular Filtration Ra* 04/30/2022 85    Calcium 04/30/2022 11.1 (H)    AST  04/30/2022 18    ALT  04/30/2022 16    Alk Phos (alkaline Phosp* 04/30/2022 131 (H)    Albumin 04/30/2022 4.6    Bilirubin, Total 04/30/2022 0.6    Protein, Total 04/30/2022 7.3    A/G Ratio 04/30/2022 1.7    Hemoglobin A1C 04/30/2022 6.0 (H)    Average Blood Glucose (C* 04/30/2022 126       ASSESSMENT: Ms. Wilker is a 72 y.o. female presenting for consultation for ductal carcinoma in situ, high-grade with clear necrosis.    Patient was oriented again about the pathology results. Surgical alternatives were discussed with patient including partial vs total mastectomy. Surgical technique and post operative care was discussed with patient. Risk of surgery was discussed with patient including but not limited to: wound infection, seroma, hematoma, brachial plexopathy, mondor's disease (thrombosis of small veins of breast), chronic wound pain, breast lymphedema, altered sensation to the nipple and cosmesis among others.   Due to small size, 6 mm of ductal carcinoma in situ patient elected to proceed with partial mastectomy.  Due to the upper outer quadrant location and being high-grade with comedonecrosis we discussed about the adding sentinel biopsy in case this is upgraded to invasive mammary carcinoma.  The patient would like to proceed with sentinel node biopsy.  Malignant neoplasm of upper-outer quadrant of right breast in female, estrogen receptor positive (CMS/HHS-HCC) [C50.411, Z17.0]  PLAN: 1.  Right breast with frequency tag partial mastectomy with axillary sentinel lymph node biopsy ([id number], [id number]) 2. Contact us if you have any concern.   Patient verbalized understanding, all questions were answered, and were agreeable with the plan outlined above.    Carolan Shiver, MD  Electronically signed by Carolan Shiver, MD

## 2023-05-24 ENCOUNTER — Encounter: Payer: Self-pay | Admitting: Oncology

## 2023-05-24 ENCOUNTER — Inpatient Hospital Stay: Payer: Medicare HMO | Attending: Oncology | Admitting: Oncology

## 2023-05-24 ENCOUNTER — Other Ambulatory Visit: Payer: Self-pay | Admitting: General Surgery

## 2023-05-24 ENCOUNTER — Inpatient Hospital Stay: Payer: Medicare HMO

## 2023-05-24 VITALS — BP 149/83 | HR 77 | Temp 97.2°F | Resp 18 | Wt 181.9 lb

## 2023-05-24 DIAGNOSIS — Z7189 Other specified counseling: Secondary | ICD-10-CM | POA: Insufficient documentation

## 2023-05-24 DIAGNOSIS — Z807 Family history of other malignant neoplasms of lymphoid, hematopoietic and related tissues: Secondary | ICD-10-CM | POA: Diagnosis not present

## 2023-05-24 DIAGNOSIS — Z79899 Other long term (current) drug therapy: Secondary | ICD-10-CM | POA: Diagnosis not present

## 2023-05-24 DIAGNOSIS — Z87891 Personal history of nicotine dependence: Secondary | ICD-10-CM | POA: Insufficient documentation

## 2023-05-24 DIAGNOSIS — Z803 Family history of malignant neoplasm of breast: Secondary | ICD-10-CM | POA: Diagnosis not present

## 2023-05-24 DIAGNOSIS — D0511 Intraductal carcinoma in situ of right breast: Secondary | ICD-10-CM

## 2023-05-24 DIAGNOSIS — C50411 Malignant neoplasm of upper-outer quadrant of right female breast: Secondary | ICD-10-CM

## 2023-05-24 LAB — COMPREHENSIVE METABOLIC PANEL
ALT: 29 U/L (ref 0–44)
AST: 26 U/L (ref 15–41)
Albumin: 4.4 g/dL (ref 3.5–5.0)
Alkaline Phosphatase: 121 U/L (ref 38–126)
Anion gap: 8 (ref 5–15)
BUN: 11 mg/dL (ref 8–23)
CO2: 24 mmol/L (ref 22–32)
Calcium: 10.4 mg/dL — ABNORMAL HIGH (ref 8.9–10.3)
Chloride: 110 mmol/L (ref 98–111)
Creatinine, Ser: 0.9 mg/dL (ref 0.44–1.00)
GFR, Estimated: >60 mL/min (ref >60)
Glucose, Bld: 109 mg/dL — ABNORMAL HIGH (ref 70–99)
Potassium: 3.4 mmol/L — ABNORMAL LOW (ref 3.5–5.1)
Sodium: 142 mmol/L (ref 135–145)
Total Bilirubin: 0.7 mg/dL (ref 0.3–1.2)
Total Protein: 7.9 g/dL (ref 6.5–8.1)

## 2023-05-24 LAB — CBC WITH DIFFERENTIAL/PLATELET
Abs Immature Granulocytes: 0.02 10*3/uL (ref 0.00–0.07)
Basophils Absolute: 0 10*3/uL (ref 0.0–0.1)
Basophils Relative: 0 %
Eosinophils Absolute: 0 10*3/uL (ref 0.0–0.5)
Eosinophils Relative: 0 %
HCT: 43.9 % (ref 36.0–46.0)
Hemoglobin: 13.7 g/dL (ref 12.0–15.0)
Immature Granulocytes: 0 %
Lymphocytes Relative: 13 %
Lymphs Abs: 0.9 10*3/uL (ref 0.7–4.0)
MCH: 25 pg — ABNORMAL LOW (ref 26.0–34.0)
MCHC: 31.2 g/dL (ref 30.0–36.0)
MCV: 80.3 fL (ref 80.0–100.0)
Monocytes Absolute: 0.1 10*3/uL (ref 0.1–1.0)
Monocytes Relative: 2 %
Neutro Abs: 6.2 10*3/uL (ref 1.7–7.7)
Neutrophils Relative %: 85 %
Platelets: 238 10*3/uL (ref 150–400)
RBC: 5.47 MIL/uL — ABNORMAL HIGH (ref 3.87–5.11)
RDW: 13.7 % (ref 11.5–15.5)
WBC: 7.3 10*3/uL (ref 4.0–10.5)
nRBC: 0 % (ref 0.0–0.2)

## 2023-05-24 NOTE — Assessment & Plan Note (Signed)
Refer to genetic counseling.

## 2023-05-24 NOTE — Progress Notes (Addendum)
Hematology/Oncology Consult Note [phone] [phone]     REFERRING PROVIDER: Lorn Junes, FNP    CHIEF COMPLAINTS/PURPOSE OF CONSULTATION:  Right breast high grade DCIS  ASSESSMENT & PLAN:   Cancer Staging  Ductal carcinoma in situ (DCIS) of right breast Staging form: Breast, AJCC 8th Edition - Clinical stage from 05/24/2023: Stage 0 (cTis (DCIS), cN0, cM0) - Signed by Rickard Patience, MD on 05/24/2023   Ductal carcinoma in situ (DCIS) of right breast The DCIS diagnosis and care plan were discussed with patient in detail.  We discussed that DCIS is a non invasive breast cancer,  cancer is only in the duct and has not spread into the tissues around it. High grade DCIS has 10% chance of having invasive breast carcinoma.  Recommend right breast lumpectomy. She has already met with surgery Dr.Cintron and has elected to proceed with right axillary lymph node SLNB due to the right upper quadrant location.   Discussed about adjuvant radiation and adjuvant endocrine therapy if ER positive.    Goals of care, counseling/discussion Discussed with patient. Curative intent.   Family history of breast cancer Refer to genetic counseling.    Orders Placed This Encounter  Procedures   CBC with Differential/Platelet    Standing Status:   Future    Number of Occurrences:   1    Standing Expiration Date:   05/23/2024   Comprehensive metabolic panel    Standing Status:   Future    Number of Occurrences:   1    Standing Expiration Date:   05/23/2024   Ambulatory referral to Genetics    Referral Priority:   Routine    Referral Type:   Consultation    Referral Reason:   Specialty Services Required    Number of Visits Requested:   1  r Follow up 2 weeks after surgery All questions were answered. The patient knows to call the clinic with any problems, questions or concerns.  Rickard Patience, MD, PhD [facility] Hematology Oncology 05/24/2023    HISTORY OF PRESENTING  ILLNESS:  Kim Ruiz 72 y.o. female presents to establish care for right breast DCIS I have reviewed her chart and materials related to her cancer extensively and collaborated history with the patient. Summary of oncologic history is as follows: Oncology History  Ductal carcinoma in situ (DCIS) of right breast  04/30/2023 Imaging   Bilateral screening mammogram showed  In the right breast, calcifications warrant further evaluation with magnified views. In the left breast, no findings suspicious for malignancy.     05/10/2023 Mammogram   Right diagnostic mammogram showed Magnified views are performed of calcifications in the UPPER-OUTER QUADRANT of the RIGHT breast. These views demonstrate a 6 millimeter group of pleomorphic and linear calcifications.   05/24/2023 Initial Diagnosis   Ductal carcinoma in situ (DCIS) of right breast  05/20/2023  1. Breast, right, needle core biopsy, upper outer posterior depth, ribbon clip:  DCIS, high grade, comedo -type  Necrosis present, calcification present, fibrocystic changes, adenosis.    Menarche at age of 20 No children OCP use: No History of hysterectomy: yes  Menopausal status: postmenopausal History of HRT use: No History of chest radiation: No  Number of previous breast biopsies:  No   05/24/2023 Cancer Staging   Staging form: Breast, AJCC 8th Edition - Clinical stage from 05/24/2023: Stage 0 (cTis (DCIS), cN0, cM0) - Signed by Rickard Patience, MD on 05/24/2023 Stage prefix: Initial diagnosis Nuclear grade: G3  MEDICAL HISTORY:  Past Medical History:  Diagnosis Date   Dysrhythmia    Heart murmur    Hypertension     SURGICAL HISTORY: Past Surgical History:  Procedure Laterality Date   ABDOMINAL HYSTERECTOMY     BREAST BIOPSY Right 03/2013   BREAST BIOPSY Right 05/20/2023   MM RT BREAST BX W LOC DEV 1ST LESION IMAGE BX SPEC STEREO GUIDE 05/20/2023 [facility]   CARPAL TUNNEL RELEASE     COLONOSCOPY     COLONOSCOPY  WITH PROPOFOL N/A 03/01/2015   Procedure: COLONOSCOPY WITH PROPOFOL;  Surgeon: Midge Minium, MD;  Location: [facility] ENDOSCOPY;  Service: Endoscopy;  Laterality: N/A;   KNEE ARTHROSCOPY     KNEE ARTHROSCOPY WITH MEDIAL MENISECTOMY Right 02/01/2015   Procedure: KNEE ARTHROSCOPY WITH MEDIAL MENISECTOMY;  Surgeon: Myra Rude, MD;  Location: [facility] ORS;  Service: Orthopedics;  Laterality: Right;    SOCIAL HISTORY: Social History   Socioeconomic History   Marital status: Single    Spouse name: Not on file   Number of children: Not on file   Years of education: Not on file   Highest education level: Not on file  Occupational History   Not on file  Tobacco Use   Smoking status: Former    Current packs/day: 0.00    Average packs/day: 0.3 packs/day for 13.0 years (3.3 ttl pk-yrs)    Types: Cigarettes    Start date: 43    Quit date: 36    Years since quitting: 36.7   Smokeless tobacco: Not on file  Vaping Use   Vaping status: Never Used  Substance and Sexual Activity   Alcohol use: No   Drug use: No   Sexual activity: Yes    Birth control/protection: Post-menopausal  Other Topics Concern   Not on file  Social History Narrative   ** Merged History Encounter **       Social Determinants of Health   Financial Resource Strain: Not on file  Food Insecurity: No Food Insecurity (05/24/2023)   Hunger Vital Sign    Worried About Running Out of Food in the Last Year: Never true    Ran Out of Food in the Last Year: Never true  Transportation Needs: No Transportation Needs (05/24/2023)   PRAPARE - Administrator, Civil Service (Medical): No    Lack of Transportation (Non-Medical): No  Physical Activity: Not on file  Stress: Not on file  Social Connections: Not on file  Intimate Partner Violence: Not At Risk (05/24/2023)   Humiliation, Afraid, Rape, and Kick questionnaire    Fear of Current or Ex-Partner: No    Emotionally Abused: No    Physically Abused: No    Sexually  Abused: No    FAMILY HISTORY: Family History  Problem Relation Age of Onset   Heart attack Mother    Breast cancer Sister 73   Multiple myeloma Brother    Breast cancer Other        40's    ALLERGIES:  has No Known Allergies.  MEDICATIONS:  Current Outpatient Medications  Medication Sig Dispense Refill   amLODipine (NORVASC) 10 MG tablet Take 10 mg by mouth daily.     atorvastatin (LIPITOR) 40 MG tablet Take 40 mg by mouth at bedtime.     hydrochlorothiazide (HYDRODIURIL) 25 MG tablet Take 25 mg by mouth daily.     naproxen sodium (ANAPROX) 220 MG tablet Take 220 mg by mouth 2 (two) times daily with a meal.     acyclovir (  ZOVIRAX) 400 MG tablet Take 400 mg by mouth 2 (two) times daily. (Patient not taking: Reported on 05/24/2023)     HYDROmorphone (DILAUDID) 2 MG tablet Take 1 tablet (2 mg total) by mouth every 4 (four) hours as needed for severe pain. (Patient not taking: Reported on 05/24/2023) 40 tablet 0   ibuprofen (ADVIL) 800 MG tablet Take 800 mg by mouth every 8 (eight) hours as needed. (Patient not taking: Reported on 05/24/2023)     No current facility-administered medications for this visit.    Review of Systems  Constitutional:  Negative for appetite change, chills, fatigue and fever.  HENT:   Negative for hearing loss and voice change.   Eyes:  Negative for eye problems.  Respiratory:  Negative for chest tightness and cough.   Cardiovascular:  Negative for chest pain.  Gastrointestinal:  Negative for abdominal distention, abdominal pain and blood in stool.  Endocrine: Negative for hot flashes.  Genitourinary:  Negative for difficulty urinating and frequency.   Musculoskeletal:  Negative for arthralgias.  Skin:  Negative for itching and rash.  Neurological:  Negative for extremity weakness.  Hematological:  Negative for adenopathy.  Psychiatric/Behavioral:  Negative for confusion.      PHYSICAL EXAMINATION: ECOG PERFORMANCE STATUS: 0 - Asymptomatic  Vitals:    05/24/23 1248  BP: (!) 149/83  Pulse: 77  Resp: 18  Temp: (!) 97.2 F (36.2 C)   Filed Weights   05/24/23 1248  Weight: 181 lb 14.4 oz (82.5 kg)    Physical Exam Constitutional:      General: She is not in acute distress.    Appearance: She is not diaphoretic.  HENT:     Head: Normocephalic and atraumatic.  Eyes:     General: No scleral icterus. Cardiovascular:     Rate and Rhythm: Normal rate and regular rhythm.     Heart sounds: No murmur heard. Pulmonary:     Effort: Pulmonary effort is normal. No respiratory distress.     Breath sounds: No wheezing.  Abdominal:     General: There is no distension.     Palpations: Abdomen is soft.     Tenderness: There is no abdominal tenderness.  Musculoskeletal:        General: Normal range of motion.     Cervical back: Normal range of motion and neck supple.  Skin:    General: Skin is warm and dry.     Findings: No erythema.  Neurological:     Mental Status: She is alert and oriented to person, place, and time.     Cranial Nerves: No cranial nerve deficit.     Motor: No abnormal muscle tone.     Coordination: Coordination normal.  Psychiatric:        Mood and Affect: Mood and affect normal.      LABORATORY DATA:  I have reviewed the data as listed    Latest Ref Rng & Units 05/24/2023    1:18 PM  CBC  WBC 4.0 - 10.5 K/uL 7.3   Hemoglobin 12.0 - 15.0 g/dL 01.0   Hematocrit 27.2 - 46.0 % 43.9   Platelets 150 - 400 K/uL 238       Latest Ref Rng & Units 05/24/2023    1:18 PM  CMP  Glucose 70 - 99 mg/dL 536   BUN 8 - 23 mg/dL 11   Creatinine 6.44 - 1.00 mg/dL 0.34   Sodium 742 - 595 mmol/L 142   Potassium 3.5 - 5.1 mmol/L 3.4  Chloride 98 - 111 mmol/L 110   CO2 22 - 32 mmol/L 24   Calcium 8.9 - 10.3 mg/dL 16.1   Total Protein 6.5 - 8.1 g/dL 7.9   Total Bilirubin 0.3 - 1.2 mg/dL 0.7   Alkaline Phos 38 - 126 U/L 121   AST 15 - 41 U/L 26   ALT 0 - 44 U/L 29      RADIOGRAPHIC STUDIES: I have personally  reviewed the radiological images as listed and agreed with the findings in the report. MM RT BREAST BX W LOC DEV 1ST LESION IMAGE BX SPEC STEREO GUIDE  Addendum Date: 05/21/2023   ADDENDUM REPORT: 05/21/2023 12:08 ADDENDUM: PATHOLOGY revealed: 1. Breast, right, needle core biopsy, upper outer posterior depth, ribbon clip : - DUCTAL CARCINOMA IN SITU (DCIS), HIGH GRADE (3), COMEDO-TYPE. - NECROSIS: PRESENT. - CALCIFICATIONS: PRESENT. - DCIS LENGTH: 0.3 CM. - OTHER FINDINGS: FIBROCYSTIC CHANGES, ADENOSIS. Pathology results are CONCORDANT with imaging findings, per Dr. Meda Klinefelter. Pathology results and recommendations were discussed with patient via telephone on 05/21/2023. Patient reported biopsy site doing well with no adverse symptoms, and only slight tenderness at the site. Post biopsy care instructions were reviewed, questions were answered and my direct phone number was provided. Patient was instructed to call [facility] for any additional questions or concerns related to biopsy site. RECOMMENDATIONS: 1. Surgical and oncological consultation. Request for surgical and oncological consultation relayed to Irving Shows RN at [facility] by Randa Lynn RN on 05/21/2023. 2. Consider pretreatment bilateral breast MRI with and without contrast to determine extent of breast disease given diagnosis of high grade DCIS. Pathology results reported by Randa Lynn RN on 05/21/2023. Electronically Signed   By: Meda Klinefelter M.D.   On: 05/21/2023 12:08   Result Date: 05/21/2023 CLINICAL DATA:  Indeterminate calcifications EXAM: RIGHT BREAST STEREOTACTIC CORE NEEDLE BIOPSY COMPARISON:  Previous exam(s). FINDINGS: The patient and I discussed the procedure of stereotactic-guided biopsy including benefits and alternatives. We discussed the high likelihood of a successful procedure. We discussed the risks of the procedure including infection, bleeding, tissue injury, clip migration, and  inadequate sampling. Informed written consent was given. The usual time out protocol was performed immediately prior to the procedure. Using sterile technique and 1% lidocaine and 1% lidocaine with epinephrine as local anesthetic, under stereotactic guidance, a 9 gauge vacuum assisted device was used to perform core needle biopsy of calcifications in the upper outer quadrant of the RIGHT breast using a lateral approach. Specimen radiograph was performed showing representative calcifications. Specimens with calcifications are identified for pathology. Lesion quadrant: Upper outer quadrant At the conclusion of the procedure, a RIBBON shaped tissue marker clip was deployed into the biopsy cavity. Follow-up 2-view mammogram was performed and dictated separately. IMPRESSION: Stereotactic-guided biopsy of indeterminate calcifications. No apparent complications. Electronically Signed: By: Meda Klinefelter M.D. On: 05/20/2023 09:20   MM CLIP PLACEMENT RIGHT  Result Date: 05/20/2023 CLINICAL DATA:  Status post stereotactic guided biopsy EXAM: 3D DIAGNOSTIC RIGHT MAMMOGRAM POST STEREOTACTIC BIOPSY COMPARISON:  Previous exam(s). FINDINGS: 3D Mammographic images were obtained following stereotactic guided biopsy of calcifications. The RIBBON biopsy marking clip is in expected position at the site of biopsy. IMPRESSION: Appropriate positioning of the RIBBON shaped biopsy marking clip at the site of biopsy in the upper outer breast at posterior depth. Final Assessment: Post Procedure Mammograms for Marker Placement BI-RADS CATEGORY  60M: Post-Procedure Mammogram for Marker Placement Electronically Signed   By: Meda Klinefelter M.D.  On: 05/20/2023 09:20   MM 3D DIAGNOSTIC MAMMOGRAM UNILATERAL RIGHT BREAST  Result Date: 05/10/2023 CLINICAL DATA:  Patient returns after screening study for evaluation of RIGHT breast calcifications. EXAM: DIGITAL DIAGNOSTIC UNILATERAL RIGHT MAMMOGRAM WITH TOMOSYNTHESIS AND CAD TECHNIQUE:  Right digital diagnostic mammography and breast tomosynthesis was performed. The images were evaluated with computer-aided detection. COMPARISON:  Previous exam(s). ACR Breast Density Category b: There are scattered areas of fibroglandular density. FINDINGS: Magnified views are performed of calcifications in the UPPER-OUTER QUADRANT of the RIGHT breast. These views demonstrate a 6 millimeter group of pleomorphic and linear calcifications. IMPRESSION: Indeterminate RIGHT breast calcifications. RECOMMENDATION: Stereotactic biopsy of RIGHT breast calcifications I have discussed the findings and recommendations with the patient. If applicable, a reminder letter will be sent to the patient regarding the next appointment. BI-RADS CATEGORY  4: Suspicious. Electronically Signed   By: Norva Pavlov M.D.   On: 05/10/2023 11:30   MM 3D SCREENING MAMMOGRAM BILATERAL BREAST  Result Date: 04/30/2023 CLINICAL DATA:  Screening. EXAM: DIGITAL SCREENING BILATERAL MAMMOGRAM WITH TOMOSYNTHESIS AND CAD TECHNIQUE: Bilateral screening digital craniocaudal and mediolateral oblique mammograms were obtained. Bilateral screening digital breast tomosynthesis was performed. The images were evaluated with computer-aided detection. COMPARISON:  Previous exam(s). ACR Breast Density Category b: There are scattered areas of fibroglandular density. FINDINGS: In the right breast, calcifications warrant further evaluation with magnified views. In the left breast, no findings suspicious for malignancy. IMPRESSION: Further evaluation is suggested for calcifications in the right breast. RECOMMENDATION: Diagnostic mammogram of the right breast. (Code:[id number]) The patient will be contacted regarding the findings, and additional imaging will be scheduled. BI-RADS CATEGORY  0: Incomplete: Need additional imaging evaluation. Electronically Signed   By: Frederico Hamman M.D.   On: 04/30/2023 08:45

## 2023-05-24 NOTE — Assessment & Plan Note (Signed)
The DCIS diagnosis and care plan were discussed with patient in detail.  We discussed that DCIS is a non invasive breast cancer,  cancer is only in the duct and has not spread into the tissues around it. High grade DCIS has 10% chance of having invasive breast carcinoma.  Recommend right breast lumpectomy. She has already met with surgery Dr.Cintron and has elected to proceed with right axillary lymph node SLNB due to the right upper quadrant location.   Discussed about adjuvant radiation and adjuvant endocrine therapy if ER positive.

## 2023-05-24 NOTE — Assessment & Plan Note (Signed)
Discussed with patient. Curative intent.

## 2023-05-27 ENCOUNTER — Encounter: Payer: Self-pay | Admitting: *Deleted

## 2023-05-27 NOTE — Progress Notes (Signed)
Patient called needing rx for surgical bra [facility].   Call placed to Dr. Vedia Coffer nurse Roanna Raider.  Sherri is going to call them to see what they are needing.

## 2023-05-28 ENCOUNTER — Encounter
Admission: RE | Admit: 2023-05-28 | Discharge: 2023-05-28 | Disposition: A | Discharge status: Home / Self Care | Payer: Medicare HMO | Source: Ambulatory Visit | Attending: General Surgery | Admitting: General Surgery

## 2023-05-28 ENCOUNTER — Other Ambulatory Visit: Payer: Self-pay

## 2023-05-28 DIAGNOSIS — I10 Essential (primary) hypertension: Secondary | ICD-10-CM

## 2023-05-28 HISTORY — DX: Unspecified osteoarthritis, unspecified site: M19.90

## 2023-05-28 NOTE — Patient Instructions (Addendum)
Your procedure is scheduled on: 05/31/23 - Friday Report to the Registration Desk on the 1st floor of the [facility]. To find out your arrival time, please call [phone] between 1PM - 3PM on: 06/01/23 - Thursday If your arrival time is 6:00 am, do not arrive before that time as the [facility] entrance doors do not open until 6:00 am.  REMEMBER: Instructions that are not followed completely may result in serious medical risk, up to and including death; or upon the discretion of your surgeon and anesthesiologist your surgery may need to be rescheduled.  Do not eat food or drink any liquids after midnight the night before surgery.  No gum chewing or hard candies.  One week prior to surgery: Stop Anti-inflammatories (NSAIDS) such as Advil, Aleve, Ibuprofen, Motrin, Naproxen, Naprosyn and Aspirin based products such as Excedrin, Goody's Powder, BC Powder. You may however, continue to take Tylenol if needed for pain up until the day of surgery.  Stop ANY OVER THE COUNTER supplements until after surgery.   TAKE ONLY THESE MEDICATIONS THE MORNING OF SURGERY WITH A SIP OF WATER:  amLODipine (NORVASC)    No Alcohol for 24 hours before or after surgery.  No Smoking including e-cigarettes for 24 hours before surgery.  No chewable tobacco products for at least 6 hours before surgery.  No nicotine patches on the day of surgery.  Do not use any "recreational" drugs for at least a week (preferably 2 weeks) before your surgery.  Please be advised that the combination of cocaine and anesthesia may have negative outcomes, up to and including death. If you test positive for cocaine, your surgery will be cancelled.  On the morning of surgery brush your teeth with toothpaste and water, you may rinse your mouth with mouthwash if you wish. Do not swallow any toothpaste or mouthwash.  Use CHG Soap or wipes as directed on instruction sheet.  Do not wear jewelry, make-up, hairpins, clips or nail  polish.  For welded (permanent) jewelry: bracelets, anklets, waist bands, etc.  Please have this removed prior to surgery.  If it is not removed, there is a chance that hospital personnel will need to cut it off on the day of surgery.  Do not wear lotions, powders, or perfumes.   Do not shave body hair from the neck down 48 hours before surgery.  Contact lenses, hearing aids and dentures may not be worn into surgery.  Do not bring valuables to the hospital. [facility] is not responsible for any missing/lost belongings or valuables.   Notify your doctor if there is any change in your medical condition (cold, fever, infection).  Wear comfortable clothing (specific to your surgery type) to the hospital.  After surgery, you can help prevent lung complications by doing breathing exercises.  Take deep breaths and cough every 1-2 hours. Your doctor may order a device called an Incentive Spirometer to help you take deep breaths. When coughing or sneezing, hold a pillow firmly against your incision with both hands. This is called "splinting." Doing this helps protect your incision. It also decreases belly discomfort.  If you are being admitted to the hospital overnight, leave your suitcase in the car. After surgery it may be brought to your room.  In case of increased patient census, it may be necessary for you, the patient, to continue your postoperative care in the Same Day Surgery department.  If you are being discharged the day of surgery, you will not be allowed to drive  home. You will need a responsible individual to drive you home and stay with you for 24 hours after surgery.   If you are taking public transportation, you will need to have a responsible individual with you.  Please call the Pre-admissions Testing Dept. at [phone] if you have any questions about these instructions.  Surgery Visitation Policy:  Patients having surgery or a procedure may have two visitors.   Children under the age of 1 must have an adult with them who is not the patient.  Inpatient Visitation:    Visiting hours are 7 a.m. to 8 p.m. Up to four visitors are allowed at one time in a patient room. The visitors may rotate out with other people during the day.  One visitor age 35 or older may stay with the patient overnight and must be in the room by 8 p.m.     Preparing for Surgery with CHLORHEXIDINE GLUCONATE (CHG) Soap  Chlorhexidine Gluconate (CHG) Soap  o An antiseptic cleaner that kills germs and bonds with the skin to continue killing germs even after washing  o Used for showering the night before surgery and morning of surgery  Before surgery, you can play an important role by reducing the number of germs on your skin.  CHG (Chlorhexidine gluconate) soap is an antiseptic cleanser which kills germs and bonds with the skin to continue killing germs even after washing.  Please do not use if you have an allergy to CHG or antibacterial soaps. If your skin becomes reddened/irritated stop using the CHG.  1. Shower the NIGHT BEFORE SURGERY and the MORNING OF SURGERY with CHG soap.  2. If you choose to wash your hair, wash your hair first as usual with your normal shampoo.  3. After shampooing, rinse your hair and body thoroughly to remove the shampoo.  4. Use CHG as you would any other liquid soap. You can apply CHG directly to the skin and wash gently with a scrungie or a clean washcloth.  5. Apply the CHG soap to your body only from the neck down. Do not use on open wounds or open sores. Avoid contact with your eyes, ears, mouth, and genitals (private parts). Wash face and genitals (private parts) with your normal soap.  6. Wash thoroughly, paying special attention to the area where your surgery will be performed.  7. Thoroughly rinse your body with warm water.  8. Do not shower/wash with your normal soap after using and rinsing off the CHG soap.  9. Pat yourself  dry with a clean towel.  10. Wear clean pajamas to bed the night before surgery.  12. Place clean sheets on your bed the night of your first shower and do not sleep with pets.  13. Shower again with the CHG soap on the day of surgery prior to arriving at the hospital.  14. Do not apply any deodorants/lotions/powders.  15. Please wear clean clothes to the hospital.

## 2023-05-29 ENCOUNTER — Encounter
Admission: RE | Admit: 2023-05-29 | Discharge: 2023-05-29 | Disposition: A | Discharge status: Home / Self Care | Payer: Medicare HMO | Source: Ambulatory Visit | Attending: General Surgery | Admitting: General Surgery

## 2023-05-29 DIAGNOSIS — Z01810 Encounter for preprocedural cardiovascular examination: Secondary | ICD-10-CM | POA: Insufficient documentation

## 2023-05-29 DIAGNOSIS — I10 Essential (primary) hypertension: Secondary | ICD-10-CM | POA: Diagnosis not present

## 2023-05-30 ENCOUNTER — Other Ambulatory Visit: Payer: Self-pay | Admitting: Family Medicine

## 2023-05-30 ENCOUNTER — Ambulatory Visit
Admission: RE | Admit: 2023-05-30 | Discharge: 2023-05-30 | Disposition: A | Discharge status: Home / Self Care | Payer: Medicare HMO | Source: Ambulatory Visit | Attending: General Surgery | Admitting: General Surgery

## 2023-05-30 DIAGNOSIS — C50411 Malignant neoplasm of upper-outer quadrant of right female breast: Secondary | ICD-10-CM

## 2023-05-30 DIAGNOSIS — Z170 Estrogen receptor positive status [ER+]: Secondary | ICD-10-CM | POA: Diagnosis present

## 2023-05-30 DIAGNOSIS — M8580 Other specified disorders of bone density and structure, unspecified site: Secondary | ICD-10-CM

## 2023-05-30 HISTORY — PX: BREAST LUMPECTOMY,RADIO FREQ LOCALIZER,AXILLARY SENTINEL LYMPH NODE BIOPSY: SHX6900

## 2023-05-30 HISTORY — PX: BREAST BIOPSY: SHX20

## 2023-05-30 MED ORDER — LIDOCAINE HCL (PF) 1 % IJ SOLN
10.0000 mL | Freq: Once | INTRAMUSCULAR | Status: AC
  Administered 2023-05-30: 10 mL
  Filled 2023-05-30: qty 10

## 2023-05-30 MED ORDER — LACTATED RINGERS IV SOLN: INTRAVENOUS | Status: DC

## 2023-05-30 MED ORDER — CHLORHEXIDINE GLUCONATE 0.12 % MT SOLN
15.0000 mL | Freq: Once | OROMUCOSAL | Status: AC
  Administered 2023-05-31: 15 mL via OROMUCOSAL

## 2023-05-30 MED ORDER — ORAL CARE MOUTH RINSE: 15.0000 mL | Freq: Once | OROMUCOSAL | Status: AC

## 2023-05-30 MED ORDER — CEFAZOLIN SODIUM-DEXTROSE 2-4 GM/100ML-% IV SOLN
2.0000 g | INTRAVENOUS | Status: AC
  Administered 2023-05-31: 2 g via INTRAVENOUS

## 2023-05-30 MED ORDER — FAMOTIDINE 20 MG PO TABS
20.0000 mg | ORAL_TABLET | Freq: Once | ORAL | Status: AC
  Administered 2023-05-31: 20 mg via ORAL

## 2023-05-31 ENCOUNTER — Ambulatory Visit: Payer: Medicare HMO | Admitting: Urgent Care

## 2023-05-31 ENCOUNTER — Encounter
Admission: RE | Disposition: A | Discharge status: Home / Self Care | Payer: Self-pay | Source: Ambulatory Visit | Attending: General Surgery

## 2023-05-31 ENCOUNTER — Other Ambulatory Visit: Payer: Self-pay

## 2023-05-31 ENCOUNTER — Encounter: Payer: Self-pay | Admitting: General Surgery

## 2023-05-31 ENCOUNTER — Ambulatory Visit
Admission: RE | Admit: 2023-05-31 | Discharge: 2023-05-31 | Disposition: A | Discharge status: Home / Self Care | Payer: Medicare HMO | Source: Ambulatory Visit | Attending: General Surgery | Admitting: General Surgery

## 2023-05-31 ENCOUNTER — Ambulatory Visit
Admission: RE | Admit: 2023-05-31 | Discharge: 2023-05-31 | Disposition: A | Discharge status: Home / Self Care | Payer: Medicare HMO | Source: Ambulatory Visit | Attending: General Surgery

## 2023-05-31 DIAGNOSIS — Z87891 Personal history of nicotine dependence: Secondary | ICD-10-CM | POA: Insufficient documentation

## 2023-05-31 DIAGNOSIS — Z170 Estrogen receptor positive status [ER+]: Secondary | ICD-10-CM | POA: Diagnosis not present

## 2023-05-31 DIAGNOSIS — C50411 Malignant neoplasm of upper-outer quadrant of right female breast: Secondary | ICD-10-CM | POA: Insufficient documentation

## 2023-05-31 DIAGNOSIS — I10 Essential (primary) hypertension: Secondary | ICD-10-CM | POA: Diagnosis not present

## 2023-05-31 DIAGNOSIS — Z803 Family history of malignant neoplasm of breast: Secondary | ICD-10-CM | POA: Diagnosis not present

## 2023-05-31 DIAGNOSIS — R011 Cardiac murmur, unspecified: Secondary | ICD-10-CM | POA: Insufficient documentation

## 2023-05-31 DIAGNOSIS — M1990 Unspecified osteoarthritis, unspecified site: Secondary | ICD-10-CM | POA: Insufficient documentation

## 2023-05-31 HISTORY — PX: PART MASTECTOMY,RADIO FREQUENCY LOCALIZER,AXILLARY SENTINEL NODE BIOPSY: SHX6901

## 2023-05-31 HISTORY — PX: BREAST LUMPECTOMY: SHX2

## 2023-05-31 SURGERY — PART MASTECTOMY,RADIO FREQUENCY LOCALIZER,AXILLARY SENTINEL NODE BIOPSY
Anesthesia: General | Site: Breast | Laterality: Right

## 2023-05-31 MED ORDER — FENTANYL CITRATE (PF) 100 MCG/2ML IJ SOLN
INTRAMUSCULAR | Status: AC | PRN
  Administered 2023-05-31 (×4): 50 ug via INTRAVENOUS

## 2023-05-31 MED ORDER — LIDOCAINE HCL (PF) 2 % IJ SOLN
INTRAMUSCULAR | Status: AC
  Filled 2023-05-31: qty 5

## 2023-05-31 MED ORDER — CHLORHEXIDINE GLUCONATE 0.12 % MT SOLN
OROMUCOSAL | Status: AC
  Filled 2023-05-31: qty 15

## 2023-05-31 MED ORDER — BUPIVACAINE-EPINEPHRINE (PF) 0.5% -1:200000 IJ SOLN
INTRAMUSCULAR | Status: AC
  Filled 2023-05-31: qty 10

## 2023-05-31 MED ORDER — DEXMEDETOMIDINE HCL IN NACL 80 MCG/20ML IV SOLN
INTRAVENOUS | Status: AC | PRN
  Administered 2023-05-31: 4 ug via INTRAVENOUS

## 2023-05-31 MED ORDER — FAMOTIDINE 20 MG PO TABS
ORAL_TABLET | ORAL | Status: AC
  Filled 2023-05-31: qty 1

## 2023-05-31 MED ORDER — PROPOFOL 10 MG/ML IV BOLUS
INTRAVENOUS | Status: AC
  Filled 2023-05-31: qty 40

## 2023-05-31 MED ORDER — ONDANSETRON HCL 4 MG/2ML IJ SOLN: 4.0000 mg | Freq: Once | INTRAMUSCULAR | Status: DC | PRN

## 2023-05-31 MED ORDER — METHYLENE BLUE (ANTIDOTE) 1 % IV SOLN
INTRAVENOUS | Status: AC
  Filled 2023-05-31: qty 10

## 2023-05-31 MED ORDER — DEXMEDETOMIDINE HCL IN NACL 80 MCG/20ML IV SOLN
INTRAVENOUS | Status: AC
  Filled 2023-05-31: qty 20

## 2023-05-31 MED ORDER — HYDROCODONE-ACETAMINOPHEN 7.5-325 MG/15ML PO SOLN
15.0000 mL | Freq: Four times a day (QID) | ORAL | 0 refills | Status: AC | PRN
Start: 2023-05-31 — End: 2023-06-10

## 2023-05-31 MED ORDER — FENTANYL CITRATE (PF) 100 MCG/2ML IJ SOLN
INTRAMUSCULAR | Status: AC
  Filled 2023-05-31: qty 2

## 2023-05-31 MED ORDER — MIDAZOLAM HCL 2 MG/2ML IJ SOLN
INTRAMUSCULAR | Status: AC
  Filled 2023-05-31: qty 2

## 2023-05-31 MED ORDER — MIDAZOLAM HCL 2 MG/2ML IJ SOLN
INTRAMUSCULAR | Status: AC | PRN
  Administered 2023-05-31: 1 mg via INTRAVENOUS

## 2023-05-31 MED ORDER — LIDOCAINE HCL (CARDIAC) PF 100 MG/5ML IV SOSY
PREFILLED_SYRINGE | INTRAVENOUS | Status: AC | PRN
  Administered 2023-05-31: 80 mg via INTRAVENOUS

## 2023-05-31 MED ORDER — FENTANYL CITRATE (PF) 100 MCG/2ML IJ SOLN: 25.0000 ug | INTRAMUSCULAR | Status: DC | PRN

## 2023-05-31 MED ORDER — DEXAMETHASONE SODIUM PHOSPHATE 10 MG/ML IJ SOLN
INTRAMUSCULAR | Status: AC | PRN
  Administered 2023-05-31: 10 mg via INTRAVENOUS

## 2023-05-31 MED ORDER — STERILE WATER FOR IRRIGATION IR SOLN
Status: DC | PRN
  Administered 2023-05-31: 500 mL

## 2023-05-31 MED ORDER — BUPIVACAINE-EPINEPHRINE (PF) 0.5% -1:200000 IJ SOLN
INTRAMUSCULAR | Status: DC | PRN
  Administered 2023-05-31: 30 mL

## 2023-05-31 MED ORDER — ROCURONIUM BROMIDE 100 MG/10ML IV SOLN
INTRAVENOUS | Status: AC | PRN
  Administered 2023-05-31: 50 mg via INTRAVENOUS

## 2023-05-31 MED ORDER — ONDANSETRON HCL 4 MG/2ML IJ SOLN
INTRAMUSCULAR | Status: AC | PRN
  Administered 2023-05-31: 4 mg via INTRAVENOUS

## 2023-05-31 MED ORDER — CEFAZOLIN SODIUM-DEXTROSE 2-4 GM/100ML-% IV SOLN
INTRAVENOUS | Status: AC
  Filled 2023-05-31: qty 100

## 2023-05-31 MED ORDER — SUGAMMADEX SODIUM 200 MG/2ML IV SOLN
INTRAVENOUS | Status: AC | PRN
Start: 2023-05-31 — End: ?
  Administered 2023-05-31: 161.4 mg via INTRAVENOUS

## 2023-05-31 MED ORDER — TECHNETIUM TC 99M TILMANOCEPT KIT
1.0000 | PACK | Freq: Once | INTRAVENOUS | Status: AC
  Administered 2023-05-31: 0.978 via INTRADERMAL

## 2023-05-31 MED ORDER — PROPOFOL 10 MG/ML IV BOLUS
INTRAVENOUS | Status: AC | PRN
  Administered 2023-05-31: 150 mg via INTRAVENOUS

## 2023-05-31 MED ORDER — BUPIVACAINE-EPINEPHRINE (PF) 0.5% -1:200000 IJ SOLN
INTRAMUSCULAR | Status: AC
  Filled 2023-05-31: qty 20

## 2023-05-31 SURGICAL SUPPLY — 44 items
ADH SKN CLS APL DERMABOND .7 (GAUZE/BANDAGES/DRESSINGS) ×1
APL PRP STRL LF DISP 70% ISPRP (MISCELLANEOUS) ×1
BLADE SURG 15 STRL LF DISP TIS (BLADE) ×2 IMPLANT
BLADE SURG 15 STRL SS (BLADE) ×1
CHLORAPREP W/TINT 26 (MISCELLANEOUS) ×1 IMPLANT
CNTNR URN SCR LID CUP LEK RST (MISCELLANEOUS) IMPLANT
CONT SPEC 4OZ STRL OR WHT (MISCELLANEOUS)
COVER PROBE GAMMA FINDER SLV (MISCELLANEOUS) ×1 IMPLANT
DERMABOND ADVANCED .7 DNX12 (GAUZE/BANDAGES/DRESSINGS) ×1 IMPLANT
DEVICE DUBIN SPECIMEN MAMMOGRA (MISCELLANEOUS) ×1 IMPLANT
DRAPE LAPAROTOMY TRNSV 106X77 (MISCELLANEOUS) ×1 IMPLANT
DRSG GAUZE FLUFF 36X18 (GAUZE/BANDAGES/DRESSINGS) IMPLANT
ELECT CAUTERY BLADE 6.4 (BLADE) ×1 IMPLANT
ELECT REM PT RETURN 9FT ADLT (ELECTROSURGICAL) ×1
ELECTRODE REM PT RTRN 9FT ADLT (ELECTROSURGICAL) ×1 IMPLANT
GAUZE 4X4 16PLY ~~LOC~~+RFID DBL (SPONGE) ×1 IMPLANT
GLOVE BIO SURGEON STRL SZ 6.5 (GLOVE) ×1 IMPLANT
GLOVE BIOGEL PI IND STRL 6.5 (GLOVE) ×1 IMPLANT
GOWN STRL REUS W/ TWL LRG LVL3 (GOWN DISPOSABLE) ×2 IMPLANT
GOWN STRL REUS W/TWL LRG LVL3 (GOWN DISPOSABLE) ×2
KIT MARKER MARGIN INK (KITS) IMPLANT
KIT TURNOVER KIT A (KITS) ×1 IMPLANT
LABEL OR SOLS (LABEL) ×1 IMPLANT
MANIFOLD NEPTUNE II (INSTRUMENTS) ×1 IMPLANT
MARKER MARGIN CORRECT CLIP (MARKER) IMPLANT
NDL HYPO 22X1.5 SAFETY MO (MISCELLANEOUS) ×1 IMPLANT
NDL HYPO 25X1 1.5 SAFETY (NEEDLE) ×1 IMPLANT
NEEDLE HYPO 22X1.5 SAFETY MO (MISCELLANEOUS) IMPLANT
NEEDLE HYPO 25X1 1.5 SAFETY (NEEDLE) ×1 IMPLANT
PACK BASIN MINOR ARMC (MISCELLANEOUS) ×1 IMPLANT
RETRACTOR RING XSMALL (MISCELLANEOUS) IMPLANT
RTRCTR WOUND ALEXIS 13CM XS SH (MISCELLANEOUS) ×1
SHEATH BREAST BIOPSY SKIN MKR (SHEATH) ×1 IMPLANT
SUT MNCRL 4-0 (SUTURE) ×1
SUT MNCRL 4-0 27XMFL (SUTURE) ×1
SUT SILK 2 0 SH (SUTURE) ×1 IMPLANT
SUT VIC AB 3-0 SH 27 (SUTURE) ×2
SUT VIC AB 3-0 SH 27X BRD (SUTURE) ×2 IMPLANT
SUTURE MNCRL 4-0 27XMF (SUTURE) ×2 IMPLANT
SYR 10ML LL (SYRINGE) ×2 IMPLANT
TRAP FLUID SMOKE EVACUATOR (MISCELLANEOUS) ×1 IMPLANT
TRAP NEPTUNE SPECIMEN COLLECT (MISCELLANEOUS) ×1 IMPLANT
WATER STERILE IRR 1000ML POUR (IV SOLUTION) ×1 IMPLANT
WATER STERILE IRR 500ML POUR (IV SOLUTION) ×1 IMPLANT

## 2023-05-31 NOTE — Interval H&P Note (Signed)
History and Physical Interval Note:  05/31/2023 11:00 AM  Kim Ruiz  has presented today for surgery, with the diagnosis of C50.411 Malignant neoplasm of upper outer quadrant of rt breast in female, estrogen receptor positive.  The various methods of treatment have been discussed with the patient and family. After consideration of risks, benefits and other options for treatment, the patient has consented to  Procedure(s): PART MASTECTOMY,RADIO FREQUENCY LOCALIZER,AXILLARY SENTINEL NODE BIOPSY (Right) as a surgical intervention.  The patient's history has been reviewed, patient examined, no change in status, stable for surgery.  I have reviewed the patient's chart and labs.  Questions were answered to the patient's satisfaction.     Carolan Shiver

## 2023-05-31 NOTE — Anesthesia Preprocedure Evaluation (Signed)
Anesthesia Evaluation  Patient identified by MRN, date of birth, ID band Patient awake    Reviewed: Allergy & Precautions, NPO status , Patient's Chart, lab work & pertinent test results  Airway Mallampati: II  TM Distance: >3 FB Neck ROM: full    Dental  (+) Chipped   Pulmonary neg pulmonary ROS, Patient abstained from smoking., former smoker   Pulmonary exam normal        Cardiovascular Exercise Tolerance: Good hypertension, Pt. on medications negative cardio ROS Normal cardiovascular exam Rhythm:Regular Rate:Normal     Neuro/Psych negative neurological ROS  negative psych ROS   GI/Hepatic negative GI ROS, Neg liver ROS,,,  Endo/Other  negative endocrine ROS    Renal/GU      Musculoskeletal   Abdominal   Peds negative pediatric ROS (+)  Hematology negative hematology ROS (+)   Anesthesia Other Findings Past Medical History: No date: Arthritis No date: Dysrhythmia No date: Heart murmur No date: Hypertension  Past Surgical History: No date: ABDOMINAL HYSTERECTOMY 03/2013: BREAST BIOPSY; Right 05/20/2023: BREAST BIOPSY; Right     Comment:  MM RT BREAST BX W LOC DEV 1ST LESION IMAGE BX SPEC               STEREO GUIDE 05/20/2023 [facility] 05/30/2023: BREAST BIOPSY; Right     Comment:  MM RT RADIO FREQUENCY TAG LOC MAMMO GUIDE 05/30/2023               [facility] 05/30/2023: BREAST LUMPECTOMY,RADIO FREQ LOCALIZER,AXILLARY SENTINEL  LYMPH NODE BIOPSY; Right     Comment:  savi placement No date: CARPAL TUNNEL RELEASE; Right No date: COLONOSCOPY 03/01/2015: COLONOSCOPY WITH PROPOFOL; N/A     Comment:  Procedure: COLONOSCOPY WITH PROPOFOL;  Surgeon: Midge Minium, MD;  Location: [facility];  Service: Endoscopy;              Laterality: N/A; No date: KNEE ARTHROSCOPY; Right 02/01/2015: KNEE ARTHROSCOPY WITH MEDIAL MENISECTOMY; Right     Comment:  Procedure: KNEE ARTHROSCOPY WITH  MEDIAL MENISECTOMY;                Surgeon: Myra Rude, MD;  Location: [facility];                Service: Orthopedics;  Laterality: Right;  BMI    Body Mass Index: 27.06 kg/m      Reproductive/Obstetrics negative OB ROS                             Anesthesia Physical Anesthesia Plan  ASA: 3  Anesthesia Plan: General   Post-op Pain Management:    Induction:   PONV Risk Score and Plan: 1 and Ondansetron and Dexamethasone  Airway Management Planned: Oral ETT  Additional Equipment:   Intra-op Plan:   Post-operative Plan: Extubation in OR  Informed Consent: I have reviewed the patients History and Physical, chart, labs and discussed the procedure including the risks, benefits and alternatives for the proposed anesthesia with the patient or authorized representative who has indicated his/her understanding and acceptance.     Dental advisory given  Plan Discussed with: CRNA and Surgeon  Anesthesia Plan Comments:        Anesthesia Quick Evaluation

## 2023-05-31 NOTE — Discharge Instructions (Signed)

## 2023-05-31 NOTE — Op Note (Signed)
Preoperative diagnosis: Right breast carcinoma.  Postoperative diagnosis: Right Breast carcinoma.   Procedure: Right radiofrequency tag-localized partial mastectomy.                      Right Axillary Sentinel Lymph node biopsy  Anesthesia: GETA  Surgeon: Dr. Hazle Quant  Wound Classification: Clean  Indications: Patient is a 72 y.o. female with a nonpalpable right breast mass noted on mammography with core biopsy demonstrating DCIS requires radiofrequency tag-localized partial mastectomy for treatment with sentinel lymph node biopsy.   Findings: 1. Specimen mammography shows marker and tag on specimen 2. Pathology call refers gross examination of margins was close to the anterior and superior margin 3. No other palpable mass or lymph node identified.   Description of procedure: Preoperative radiofrequency tag localization was performed by radiology. In the nuclear medicine suite, the subareolar region was injected with Tc-99 sulfur colloid. Localization studies were reviewed. The patient was taken to the operating room and placed supine on the operating table, and after general anesthesia the right chest and axilla were prepped and draped in the usual sterile fashion. A time-out was completed verifying correct patient, procedure, site, positioning, and implant(s) and/or special equipment prior to beginning this procedure.  By comparing the localization studies and interrogation with [facility] device, the probable trajectory and location of the mass was visualized. A circumareolar skin incision was planned in such a way as to minimize the amount of dissection to reach the mass.  The skin incision was made. Flaps were raised and the location of the tag was confirmed with [facility] device confirmed. A 2-0 silk figure-of-eight stay suture was placed and used for retraction. Dissection was then taken down circumferentially, taking care to include the entire localizing tag and a wide margin of  grossly normal tissue. The specimen and entire localizing tag were removed. The specimen was oriented and sent to radiology with the localization studies. Confirmation was received that the entire target lesion had been resected. The wound was irrigated. Hemostasis was checked. The wound was closed with interrupted sutures of 3-0 Vicryl and a subcuticular suture of Monocryl 3-0. No attempt was made to close the dead space.   A hand-held gamma probe was used to identify the location of the hottest spot in the axilla. An incision was made around the caudal axillary hairline. Dissection was carried down until subdermal facias was advanced. The probe was placed and again, the point of maximal count was found. Dissection continue until nodule was identified. The probe was placed in contact with the node. The node was excised in its entirety.  Three additional hot spot was detected and the node was excised in similar fashion. No additional hot spots were identified. No clinically abnormal nodes were palpated. The procedure was terminated. Hemostasis was achieved and the wound closed in layers with deep interrupted 3-0 Vicryl and skin was closed with subcuticular suture of Monocryl 3-0.  The patient tolerated the procedure well and was taken to the postanesthesia care unit in stable condition.    Sentinel Node Biopsy Synoptic Operative Report  Operation performed with curative intent:Yes  Tracer(s) used to identify sentinel nodes in the upfront surgery (non-neoadjuvant) setting (select all that apply):Radioactive Tracer  Tracer(s) used to identify sentinel nodes in the neoadjuvant setting (select all that apply):N/A  All nodes (colored or non-colored) present at the end of a dye-filled lymphatic channel were removed:N/A  All significantly radioactive nodes were removed:Yes  All palpable suspicious nodes were  removed:N/A  Biopsy-proven positive nodes marked with clips prior to chemotherapy were  identified and removed:N/A  Specimen: Right Breast mass                     Sentinel Lymph nodes #1, #2, #3, #4  Complications: None  Estimated Blood Loss: 10 mL

## 2023-06-02 ENCOUNTER — Encounter: Payer: Self-pay | Admitting: General Surgery

## 2023-06-04 ENCOUNTER — Other Ambulatory Visit: Payer: Self-pay | Admitting: Pathology

## 2023-06-04 LAB — SURGICAL PATHOLOGY

## 2023-06-05 ENCOUNTER — Inpatient Hospital Stay: Payer: Medicare HMO | Attending: Oncology | Admitting: Licensed Clinical Social Worker

## 2023-06-05 ENCOUNTER — Inpatient Hospital Stay: Payer: Medicare HMO

## 2023-06-05 DIAGNOSIS — Z87891 Personal history of nicotine dependence: Secondary | ICD-10-CM | POA: Insufficient documentation

## 2023-06-05 DIAGNOSIS — D0511 Intraductal carcinoma in situ of right breast: Secondary | ICD-10-CM | POA: Insufficient documentation

## 2023-06-05 DIAGNOSIS — Z803 Family history of malignant neoplasm of breast: Secondary | ICD-10-CM | POA: Insufficient documentation

## 2023-06-05 DIAGNOSIS — Z79899 Other long term (current) drug therapy: Secondary | ICD-10-CM | POA: Insufficient documentation

## 2023-06-05 DIAGNOSIS — Z807 Family history of other malignant neoplasms of lymphoid, hematopoietic and related tissues: Secondary | ICD-10-CM | POA: Insufficient documentation

## 2023-06-05 NOTE — Progress Notes (Deleted)
REFERRING PROVIDER: Rickard Patience, MD [address] [place],  [place] [address]  PRIMARY PROVIDER:  Lorn Junes, FNP  PRIMARY REASON FOR VISIT:  1. Ductal carcinoma in situ (DCIS) of right breast   2. Family history of breast cancer      HISTORY OF PRESENT ILLNESS:   Kim Ruiz, a 72 y.o. female, was seen for a Short cancer genetics consultation at the request of Dr. Cathie Hoops due to a personal and family history of breast cancer.  Kim Ruiz presents to clinic today to discuss the possibility of a hereditary predisposition to cancer, genetic testing, and to further clarify her future cancer risks, as well as potential cancer risks for family members.    CANCER HISTORY:  Oncology History  Ductal carcinoma in situ (DCIS) of right breast  04/30/2023 Imaging   Bilateral screening mammogram showed  In the right breast, calcifications warrant further evaluation with magnified views. In the left breast, no findings suspicious for malignancy.     05/10/2023 Mammogram   Right diagnostic mammogram showed Magnified views are performed of calcifications in the UPPER-OUTER QUADRANT of the RIGHT breast. These views demonstrate a 6 millimeter group of pleomorphic and linear calcifications.   05/24/2023 Initial Diagnosis   Ductal carcinoma in situ (DCIS) of right breast  05/20/2023  1. Breast, right, needle core biopsy, upper outer posterior depth, ribbon clip:  DCIS, high grade, comedo -type  Necrosis present, calcification present, fibrocystic changes, adenosis.    Menarche at age of 43 No children OCP use: No History of hysterectomy: yes  Menopausal status: postmenopausal History of HRT use: No History of chest radiation: No  Number of previous breast biopsies:  No   05/24/2023 Cancer Staging   Staging form: Breast, AJCC 8th Edition - Clinical stage from 05/24/2023: Stage 0 (cTis (DCIS), cN0, cM0) - Signed by Rickard Patience, MD on 05/24/2023 Stage prefix: Initial diagnosis Nuclear  grade: G3    In 2024, at the age of 75, Kim Ruiz was diagnosed with DCIS of the right breast. The treatment plan includes lumpectomy (completed 9/27), adjuvant radiation and possible adjuvant endocrine therapy.   RISK FACTORS:  Menarche was at age 38.  Ovaries intact: {Yes/No-Ex:120004}.  Hysterectomy: yes.  Menopausal status: postmenopausal.  HRT use: 0 years. Colonoscopy: yes; normal. Mammogram within the last year: yes.  Past Medical History:  Diagnosis Date   Arthritis    Dysrhythmia    Heart murmur    Hypertension     Past Surgical History:  Procedure Laterality Date   ABDOMINAL HYSTERECTOMY     BREAST BIOPSY Right 03/2013   BREAST BIOPSY Right 05/20/2023   MM RT BREAST BX W LOC DEV 1ST LESION IMAGE BX SPEC STEREO GUIDE 05/20/2023 [facility]   BREAST BIOPSY Right 05/30/2023   MM RT RADIO FREQUENCY TAG LOC MAMMO GUIDE 05/30/2023 [facility]   BREAST LUMPECTOMY,RADIO FREQ LOCALIZER,AXILLARY SENTINEL LYMPH NODE BIOPSY Right 05/30/2023   savi placement   CARPAL TUNNEL RELEASE Right    COLONOSCOPY     COLONOSCOPY WITH PROPOFOL N/A 03/01/2015   Procedure: COLONOSCOPY WITH PROPOFOL;  Surgeon: Midge Minium, MD;  Location: [facility];  Service: Endoscopy;  Laterality: N/A;   KNEE ARTHROSCOPY Right    KNEE ARTHROSCOPY WITH MEDIAL MENISECTOMY Right 02/01/2015   Procedure: KNEE ARTHROSCOPY WITH MEDIAL MENISECTOMY;  Surgeon: Myra Rude, MD;  Location: [facility];  Service: Orthopedics;  Laterality: Right;   PART MASTECTOMY,RADIO FREQUENCY LOCALIZER,AXILLARY SENTINEL NODE BIOPSY Right 05/31/2023   Procedure: PARTIAL MASTECTOMY,RADIO FREQUENCY LOCALIZER,AXILLARY SENTINEL  NODE BIOPSY;  Surgeon: Carolan Shiver, MD;  Location: [facility];  Service: General;  Laterality: Right;    FAMILY HISTORY:  We obtained a detailed, 4-generation family history.  Significant diagnoses are listed below: Family History  Problem Relation Age of Onset   Heart attack Mother     Breast cancer Sister 53   Multiple myeloma Brother    Breast cancer Other        52's    Kim Ruiz is unaware of previous family history of genetic testing for hereditary cancer risks. There is no reported Ashkenazi Jewish ancestry. There is no known consanguinity.  GENETIC COUNSELING ASSESSMENT: Kim Ruiz is a 72 y.o. female with a personal and family history of breast cancer which is somewhat suggestive of a hereditary cancer syndrome and predisposition to cancer. We, therefore, discussed and recommended the following at today's visit.   DISCUSSION: We discussed that approximately 10% of breast cancer is hereditary. Most cases of hereditary breast cancer are associated with BRCA1/BRCA2 genes, although there are other genes associated with hereditary cancer as well. Cancers and risks are gene specific. We discussed that testing is beneficial for several reasons including knowing about cancer risks, identifying potential screening and risk-reduction options that may be appropriate, and to understand if other family members could be at risk for cancer and allow them to undergo genetic testing.   We reviewed the characteristics, features and inheritance patterns of hereditary cancer syndromes. We also discussed genetic testing, including the appropriate family members to test, the process of testing, insurance coverage and turn-around-time for results. We discussed the implications of a negative, positive and/or variant of uncertain significant result. We recommended Kim Ruiz pursue genetic testing for the [facility] Common Hereditary Cancers+RNA gene panel.   Based on Kim Ruiz's personal and family history of cancer, she meets medical criteria for genetic testing. Despite that she meets criteria, she may still have an out of pocket cost.   PLAN: After considering the risks, benefits, and limitations, [name] provided informed consent to pursue genetic testing and the  blood sample was sent to [facility] for analysis of the Common Hereditary Cancers+RNA panel. Results should be available within approximately 2-3 weeks' time, at which point they will be disclosed by telephone to Kim Ruiz, as will any additional recommendations warranted by these results. Ms. Auguste will receive a summary of her genetic counseling visit and a copy of her results once available. This information will also be available in Epic.   Ms. Barbe questions were answered to her satisfaction today. Our contact information was provided should additional questions or concerns arise. Thank you for the referral and allowing [place] to share in the care of your patient.   Lacy Duverney, MS, [facility] Genetic Counselor [email or web address] Phone: [phone]  The patient was seen for a total of *** minutes in face-to-face genetic counseling.  Dr. Blake Divine was available for discussion regarding this case.   _______________________________________________________________________ For Office Staff:  Number of people involved in session: *** Was an Intern/ student involved with case: no

## 2023-06-07 NOTE — Anesthesia Postprocedure Evaluation (Signed)
Anesthesia Post Note  Patient: Kim Ruiz  Procedure(s) Performed: PARTIAL MASTECTOMY,RADIO FREQUENCY LOCALIZER,AXILLARY SENTINEL NODE BIOPSY (Right: Breast)  Patient location during evaluation: PACU Anesthesia Type: General Level of consciousness: awake and awake and alert Pain management: satisfactory to patient Vital Signs Assessment: post-procedure vital signs reviewed and stable Respiratory status: spontaneous breathing Cardiovascular status: stable Anesthetic complications: no   No notable events documented.   Last Vitals:  Vitals:   05/31/23 1400 05/31/23 1428  BP: (!) 190/77 (!) 187/83  Pulse: (!) 59 (!) 55  Resp: 16 16  Temp: 37.1 C (!) 36.1 C  SpO2: 96% 96%    Last Pain:  Vitals:   06/01/23 1030  TempSrc:   PainSc: 0-No pain                 VAN STAVEREN,Shahrukh Pasch

## 2023-06-10 ENCOUNTER — Other Ambulatory Visit: Payer: Self-pay | Admitting: Pathology

## 2023-06-17 ENCOUNTER — Encounter: Payer: Self-pay | Admitting: Oncology

## 2023-06-17 ENCOUNTER — Inpatient Hospital Stay (HOSPITAL_BASED_OUTPATIENT_CLINIC_OR_DEPARTMENT_OTHER): Payer: Medicare HMO | Admitting: Oncology

## 2023-06-17 ENCOUNTER — Inpatient Hospital Stay: Payer: Medicare HMO

## 2023-06-17 VITALS — BP 148/79 | HR 59 | Temp 97.4°F | Resp 18 | Wt 178.7 lb

## 2023-06-17 DIAGNOSIS — Z79899 Other long term (current) drug therapy: Secondary | ICD-10-CM | POA: Diagnosis not present

## 2023-06-17 DIAGNOSIS — Z803 Family history of malignant neoplasm of breast: Secondary | ICD-10-CM | POA: Diagnosis not present

## 2023-06-17 DIAGNOSIS — D0511 Intraductal carcinoma in situ of right breast: Secondary | ICD-10-CM | POA: Diagnosis present

## 2023-06-17 DIAGNOSIS — Z87891 Personal history of nicotine dependence: Secondary | ICD-10-CM | POA: Diagnosis not present

## 2023-06-17 DIAGNOSIS — Z807 Family history of other malignant neoplasms of lymphoid, hematopoietic and related tissues: Secondary | ICD-10-CM | POA: Diagnosis not present

## 2023-06-17 NOTE — Assessment & Plan Note (Signed)
Right breast DCIS s/p lumpectomy and SLNB. ER 95% positive Pathology results were discussed with patient.  Recommend adjuvant radiation --refer to Radonc.  Recommend adjuvant endocrine therapy - Tamoxifen vs AI. Recommend her to obtain baseline DEXA.  She will follow up a few weeks after finishing radiation for discussion of starting treat

## 2023-06-17 NOTE — Assessment & Plan Note (Signed)
Refer to genetic counseling. Patient no showed.

## 2023-06-17 NOTE — Progress Notes (Signed)
Hematology/Oncology Consult Note [phone] [phone]     REFERRING PROVIDER: Lorn Junes, FNP    CHIEF COMPLAINTS/PURPOSE OF CONSULTATION:  Right breast high grade DCIS  ASSESSMENT & PLAN:   Cancer Staging  Ductal carcinoma in situ (DCIS) of right breast Staging form: Breast, AJCC 8th Edition - Clinical stage from 05/24/2023: Stage 0 (cTis (DCIS), cN0, cM0) - Signed by Rickard Patience, MD on 05/24/2023   Ductal carcinoma in situ (DCIS) of right breast Right breast DCIS s/p lumpectomy and SLNB. ER 95% positive Pathology results were discussed with patient.  Recommend adjuvant radiation --refer to Radonc.  Recommend adjuvant endocrine therapy - Tamoxifen vs AI. Recommend her to obtain baseline DEXA.  She will follow up a few weeks after finishing radiation for discussion of starting treat  Family history of breast cancer Refer to genetic counseling. Patient no showed.   Orders Placed This Encounter  Procedures   DG Bone Density    Standing Status:   Future    Standing Expiration Date:   06/16/2024    Order Specific Question:   Reason for Exam (SYMPTOM  OR DIAGNOSIS REQUIRED)    Answer:   DCIS right breast    Order Specific Question:   Preferred imaging location?    Answer:   [facility]   Ambulatory referral to Radiation Oncology    Referral Priority:   Routine    Referral Type:   Consultation    Referral Reason:   Specialty Services Required    Requested Specialty:   Radiation Oncology    Number of Visits Requested:   1  r Follow up 2 weeks after radiation.  All questions were answered. The patient knows to call the clinic with any problems, questions or concerns.  Rickard Patience, MD, PhD [facility] Hematology Oncology 06/17/2023    HISTORY OF PRESENTING ILLNESS:  Kim Ruiz 72 y.o. female presents to establish care for right breast DCIS I have reviewed her chart and materials related to her cancer extensively and collaborated  history with the patient. Summary of oncologic history is as follows: Oncology History  Ductal carcinoma in situ (DCIS) of right breast  04/30/2023 Imaging   Bilateral screening mammogram showed  In the right breast, calcifications warrant further evaluation with magnified views. In the left breast, no findings suspicious for malignancy.     05/10/2023 Mammogram   Right diagnostic mammogram showed Magnified views are performed of calcifications in the UPPER-OUTER QUADRANT of the RIGHT breast. These views demonstrate a 6 millimeter group of pleomorphic and linear calcifications.   05/24/2023 Initial Diagnosis   Ductal carcinoma in situ (DCIS) of right breast  05/20/2023  1. Breast, right, needle core biopsy, upper outer posterior depth, ribbon clip:  DCIS, high grade, comedo -type  Necrosis present, calcification present, fibrocystic changes, adenosis.    Menarche at age of 70 No children OCP use: No History of hysterectomy: yes  Menopausal status: postmenopausal History of HRT use: No History of chest radiation: No  Number of previous breast biopsies:  No   05/24/2023 Cancer Staging   Staging form: Breast, AJCC 8th Edition - Clinical stage from 05/24/2023: Stage 0 (cTis (DCIS), cN0, cM0) - Signed by Rickard Patience, MD on 05/24/2023 Stage prefix: Initial diagnosis Nuclear grade: G3   05/31/2023 Surgery   Patient underwent lumpectomy and also got SLNB due to the upper outer quadrant location and being high-grade with comedonecrosis   1. Breast, lumpectomy, Right mass :      - RESIDUAL DUCTAL  CARCINOMA IN SITU (DCIS).      - SEE CANCER SUMMARY BELOW.      - BIOPSY SITE CHANGE WITH ASSOCIATED CLIP.      - SCOUT TAG PRESENT.       2. Breast, excision, Anterior margin :      - BENIGN BREAST TISSUE; NEGATIVE FOR MALIGNANCY.       3. Breast, excision, Superior margin :      - BENIGN BREAST TISSUE; NEGATIVE FOR MALIGNANCY.       4. Lymph node, sentinel, biopsy, Right axillary #1 :      -  ONE LYMPH NODE NEGATIVE FOR METASTATIC CARCINOMA (0/1).      - SEE NOTE.       5. Lymph node, sentinel, biopsy, right axillary #2 :      - ONE LYMPH NODE NEGATIVE FOR METASTATIC CARCINOMA (0/1).      - SEE NOTE.       6. Lymph node, sentinel, biopsy, right axillary #3 :      - ONE LYMPH NODE NEGATIVE FOR METASTATIC CARCINOMA (0/1).      - SEE NOTE.       7. Lymph node, sentinel, biopsy, right axillary #4 :      - ONE LYMPH NODE NEGATIVE FOR METASTATIC CARCINOMA (0/1).      - SEE NOTE.   MARGINS  Margin Status: All margins negative for DCIS by a distance of at least 2 mm   REGIONAL LYMPH NODES  Regional Lymph node Status: All regional lymph nodes negative for tumor  Total Number of Lymph Nodes Examined (sentinel and non-sentinel): 4  Number of Sentinel Nodes Examined: 4   ER 95% positive    She reports doing well today. Still has some soreness of right breast and axilla.  No new complains.     MEDICAL HISTORY:  Past Medical History:  Diagnosis Date   Arthritis    Dysrhythmia    Heart murmur    Hypertension     SURGICAL HISTORY: Past Surgical History:  Procedure Laterality Date   ABDOMINAL HYSTERECTOMY     BREAST BIOPSY Right 03/2013   BREAST BIOPSY Right 05/20/2023   MM RT BREAST BX W LOC DEV 1ST LESION IMAGE BX SPEC STEREO GUIDE 05/20/2023 [facility]   BREAST BIOPSY Right 05/30/2023   MM RT RADIO FREQUENCY TAG LOC MAMMO GUIDE 05/30/2023 [facility]   BREAST LUMPECTOMY,RADIO FREQ LOCALIZER,AXILLARY SENTINEL LYMPH NODE BIOPSY Right 05/30/2023   savi placement   CARPAL TUNNEL RELEASE Right    COLONOSCOPY     COLONOSCOPY WITH PROPOFOL N/A 03/01/2015   Procedure: COLONOSCOPY WITH PROPOFOL;  Surgeon: Midge Minium, MD;  Location: [facility];  Service: Endoscopy;  Laterality: N/A;   KNEE ARTHROSCOPY Right    KNEE ARTHROSCOPY WITH MEDIAL MENISECTOMY Right 02/01/2015   Procedure: KNEE ARTHROSCOPY WITH MEDIAL MENISECTOMY;  Surgeon: Myra Rude, MD;   Location: [facility];  Service: Orthopedics;  Laterality: Right;   PART MASTECTOMY,RADIO FREQUENCY LOCALIZER,AXILLARY SENTINEL NODE BIOPSY Right 05/31/2023   Procedure: PARTIAL MASTECTOMY,RADIO FREQUENCY LOCALIZER,AXILLARY SENTINEL NODE BIOPSY;  Surgeon: Carolan Shiver, MD;  Location: [facility];  Service: General;  Laterality: Right;    SOCIAL HISTORY: Social History   Socioeconomic History   Marital status: Single    Spouse name: Not on file   Number of children: Not on file   Years of education: Not on file   Highest education level: Not on file  Occupational History   Not on file  Tobacco Use  Smoking status: Former    Current packs/day: 0.00    Average packs/day: 0.3 packs/day for 13.0 years (3.3 ttl pk-yrs)    Types: Cigarettes    Start date: 78    Quit date: 69    Years since quitting: 36.8   Smokeless tobacco: Not on file  Vaping Use   Vaping status: Never Used  Substance and Sexual Activity   Alcohol use: No   Drug use: No   Sexual activity: Yes    Birth control/protection: Post-menopausal  Other Topics Concern   Not on file  Social History Narrative   ** Merged History Encounter **  lives with granddaughter   Social Determinants of Health   Financial Resource Strain: Not on file  Food Insecurity: No Food Insecurity (05/24/2023)   Hunger Vital Sign    Worried About Running Out of Food in the Last Year: Never true    Ran Out of Food in the Last Year: Never true  Transportation Needs: No Transportation Needs (05/24/2023)   PRAPARE - Administrator, Civil Service (Medical): No    Lack of Transportation (Non-Medical): No  Physical Activity: Not on file  Stress: Not on file  Social Connections: Not on file  Intimate Partner Violence: Not At Risk (05/24/2023)   Humiliation, Afraid, Rape, and Kick questionnaire    Fear of Current or Ex-Partner: No    Emotionally Abused: No    Physically Abused: No    Sexually Abused: No    FAMILY  HISTORY: Family History  Problem Relation Age of Onset   Heart attack Mother    Breast cancer Sister 76   Multiple myeloma Brother    Breast cancer Other        40's    ALLERGIES:  has No Known Allergies.  MEDICATIONS:  Current Outpatient Medications  Medication Sig Dispense Refill   amLODipine (NORVASC) 10 MG tablet Take 10 mg by mouth daily.     gabapentin (NEURONTIN) 100 MG capsule Take 100 mg by mouth as needed.     hydrochlorothiazide (HYDRODIURIL) 25 MG tablet Take 25 mg by mouth daily.     acyclovir (ZOVIRAX) 400 MG tablet Take 400 mg by mouth 2 (two) times daily. (Patient not taking: Reported on 05/24/2023)     HYDROmorphone (DILAUDID) 2 MG tablet Take 1 tablet (2 mg total) by mouth every 4 (four) hours as needed for severe pain. (Patient not taking: Reported on 05/24/2023) 40 tablet 0   ibuprofen (ADVIL) 800 MG tablet Take 800 mg by mouth every 8 (eight) hours as needed. (Patient not taking: Reported on 05/24/2023)     naproxen sodium (ANAPROX) 220 MG tablet Take 220 mg by mouth 2 (two) times daily as needed. (Patient not taking: Reported on 05/31/2023)     No current facility-administered medications for this visit.   Facility-Administered Medications Ordered in Other Visits  Medication Dose Route Frequency Provider Last Rate Last Admin   dexamethasone (DECADRON) injection   Intravenous Anesthesia Intra-op Emeterio Reeve, CRNA   10 mg at 05/31/23 1238   dexmedetomidine (PRECEDEX) 80 MCG/20ML   Intravenous Anesthesia Intra-op Emeterio Reeve, CRNA   4 mcg at 05/31/23 1219   fentaNYL (SUBLIMAZE) injection   Intravenous Anesthesia Intra-op Emeterio Reeve, CRNA   50 mcg at 05/31/23 1321   lidocaine (cardiac) 100 mg/82mL (XYLOCAINE) injection 2%   Intravenous Anesthesia Intra-op Emeterio Reeve, CRNA   80 mg at 05/31/23 1135   midazolam (VERSED) injection   Intravenous Anesthesia Intra-op Emeterio Reeve, CRNA  1 mg at 05/31/23 1129   ondansetron (ZOFRAN) injection    Intravenous Anesthesia Intra-op Emeterio Reeve, CRNA   4 mg at 05/31/23 1238   propofol (DIPRIVAN) 10 mg/mL bolus/IV push   Intravenous Anesthesia Intra-op Emeterio Reeve, CRNA   150 mg at 05/31/23 1135   rocuronium (ZEMURON) injection   Intravenous Anesthesia Intra-op Emeterio Reeve, CRNA   50 mg at 05/31/23 1135   sugammadex sodium (BRIDION) injection   Intravenous Anesthesia Intra-op Emeterio Reeve, CRNA   161.4 mg at 05/31/23 1307    Review of Systems  Constitutional:  Negative for appetite change, chills, fatigue and fever.  HENT:   Negative for hearing loss and voice change.   Eyes:  Negative for eye problems.  Respiratory:  Negative for chest tightness and cough.   Cardiovascular:  Negative for chest pain.  Gastrointestinal:  Negative for abdominal distention, abdominal pain and blood in stool.  Endocrine: Negative for hot flashes.  Genitourinary:  Negative for difficulty urinating and frequency.   Musculoskeletal:  Negative for arthralgias.  Skin:  Negative for itching and rash.  Neurological:  Negative for extremity weakness.  Hematological:  Negative for adenopathy.  Psychiatric/Behavioral:  Negative for confusion.      PHYSICAL EXAMINATION: ECOG PERFORMANCE STATUS: 0 - Asymptomatic  Vitals:   06/17/23 1333  BP: (!) 148/79  Pulse: (!) 59  Resp: 18  Temp: (!) 97.4 F (36.3 C)  SpO2: 99%   Filed Weights   06/17/23 1333  Weight: 178 lb 11.2 oz (81.1 kg)    Physical Exam Constitutional:      General: She is not in acute distress.    Appearance: She is not diaphoretic.  HENT:     Head: Normocephalic.  Eyes:     General: No scleral icterus. Cardiovascular:     Rate and Rhythm: Normal rate.  Pulmonary:     Effort: Pulmonary effort is normal. No respiratory distress.  Abdominal:     General: There is no distension.  Musculoskeletal:        General: Normal range of motion.     Cervical back: Normal range of motion and neck supple.  Skin:     Findings: No rash.  Neurological:     Mental Status: She is alert and oriented to person, place, and time. Mental status is at baseline.     Cranial Nerves: No cranial nerve deficit.     Motor: No abnormal muscle tone.  Psychiatric:        Mood and Affect: Affect normal.      LABORATORY DATA:  I have reviewed the data as listed    Latest Ref Rng & Units 05/24/2023    1:18 PM  CBC  WBC 4.0 - 10.5 K/uL 7.3   Hemoglobin 12.0 - 15.0 g/dL 16.1   Hematocrit 09.6 - 46.0 % 43.9   Platelets 150 - 400 K/uL 238       Latest Ref Rng & Units 05/24/2023    1:18 PM  CMP  Glucose 70 - 99 mg/dL 045   BUN 8 - 23 mg/dL 11   Creatinine 4.09 - 1.00 mg/dL 8.11   Sodium 914 - 782 mmol/L 142   Potassium 3.5 - 5.1 mmol/L 3.4   Chloride 98 - 111 mmol/L 110   CO2 22 - 32 mmol/L 24   Calcium 8.9 - 10.3 mg/dL 95.6   Total Protein 6.5 - 8.1 g/dL 7.9   Total Bilirubin 0.3 - 1.2 mg/dL 0.7   Alkaline Phos 38 -  126 U/L 121   AST 15 - 41 U/L 26   ALT 0 - 44 U/L 29      RADIOGRAPHIC STUDIES: I have personally reviewed the radiological images as listed and agreed with the findings in the report. MM Breast Surgical Specimen  Result Date: 05/31/2023 CLINICAL DATA:  Status post [facility] localized right breast lumpectomy. EXAM: SPECIMEN RADIOGRAPH OF THE RIGHT BREAST COMPARISON:  Previous exam(s). FINDINGS: Status post excision of the right breast. The [facility] reflector and ribbon shaped clip are present within the specimen. IMPRESSION: Specimen radiograph of the right breast. Electronically Signed   By: Amie Portland M.D.   On: 05/31/2023 12:53   NM Sentinel Node Inj-No Rpt (Breast)  Result Date: 05/31/2023 Sulfur Colloid was injected by the Nuclear Medicine Technologist for sentinel lymph node localization.   MM RT RADIO FREQUENCY TAG LOC MAMMO GUIDE  Result Date: 05/30/2023 CLINICAL DATA:  Here for SAVI scout tag placement in the right breast in an area of ductal carcinoma in-situ (ribbon clip).  EXAM: DIAGNOSTIC RIGHT MAMMOGRAM POST PLACEMENT OF SAVI SCOUT COMPARISON:  Previous exam(s). FINDINGS: Mammographic images were obtained following mammography guided placement of SAVI scout in the right breast. These demonstrate the SAVI scout located just posterior to the ribbon clip in the right breast. IMPRESSION: SAVI scout located just posterior to a ribbon clip in the right breast. Final Assessment: Post Procedure Mammograms for [facility] placement Electronically Signed   By: Romona Curls M.D.   On: 05/30/2023 16:42   MM RT BREAST BX W LOC DEV 1ST LESION IMAGE BX SPEC STEREO GUIDE  Addendum Date: 05/21/2023   ADDENDUM REPORT: 05/21/2023 12:08 ADDENDUM: PATHOLOGY revealed: 1. Breast, right, needle core biopsy, upper outer posterior depth, ribbon clip : - DUCTAL CARCINOMA IN SITU (DCIS), HIGH GRADE (3), COMEDO-TYPE. - NECROSIS: PRESENT. - CALCIFICATIONS: PRESENT. - DCIS LENGTH: 0.3 CM. - OTHER FINDINGS: FIBROCYSTIC CHANGES, ADENOSIS. Pathology results are CONCORDANT with imaging findings, per Dr. Meda Klinefelter. Pathology results and recommendations were discussed with patient via telephone on 05/21/2023. Patient reported biopsy site doing well with no adverse symptoms, and only slight tenderness at the site. Post biopsy care instructions were reviewed, questions were answered and my direct phone number was provided. Patient was instructed to call [facility] for any additional questions or concerns related to biopsy site. RECOMMENDATIONS: 1. Surgical and oncological consultation. Request for surgical and oncological consultation relayed to Irving Shows RN at [facility] by Randa Lynn RN on 05/21/2023. 2. Consider pretreatment bilateral breast MRI with and without contrast to determine extent of breast disease given diagnosis of high grade DCIS. Pathology results reported by Randa Lynn RN on 05/21/2023. Electronically Signed   By: Meda Klinefelter M.D.   On: 05/21/2023 12:08    Result Date: 05/21/2023 CLINICAL DATA:  Indeterminate calcifications EXAM: RIGHT BREAST STEREOTACTIC CORE NEEDLE BIOPSY COMPARISON:  Previous exam(s). FINDINGS: The patient and I discussed the procedure of stereotactic-guided biopsy including benefits and alternatives. We discussed the high likelihood of a successful procedure. We discussed the risks of the procedure including infection, bleeding, tissue injury, clip migration, and inadequate sampling. Informed written consent was given. The usual time out protocol was performed immediately prior to the procedure. Using sterile technique and 1% lidocaine and 1% lidocaine with epinephrine as local anesthetic, under stereotactic guidance, a 9 gauge vacuum assisted device was used to perform core needle biopsy of calcifications in the upper outer quadrant of the RIGHT breast using a lateral  approach. Specimen radiograph was performed showing representative calcifications. Specimens with calcifications are identified for pathology. Lesion quadrant: Upper outer quadrant At the conclusion of the procedure, a RIBBON shaped tissue marker clip was deployed into the biopsy cavity. Follow-up 2-view mammogram was performed and dictated separately. IMPRESSION: Stereotactic-guided biopsy of indeterminate calcifications. No apparent complications. Electronically Signed: By: Meda Klinefelter M.D. On: 05/20/2023 09:20   MM CLIP PLACEMENT RIGHT  Result Date: 05/20/2023 CLINICAL DATA:  Status post stereotactic guided biopsy EXAM: 3D DIAGNOSTIC RIGHT MAMMOGRAM POST STEREOTACTIC BIOPSY COMPARISON:  Previous exam(s). FINDINGS: 3D Mammographic images were obtained following stereotactic guided biopsy of calcifications. The RIBBON biopsy marking clip is in expected position at the site of biopsy. IMPRESSION: Appropriate positioning of the RIBBON shaped biopsy marking clip at the site of biopsy in the upper outer breast at posterior depth. Final Assessment: Post Procedure  Mammograms for Marker Placement BI-RADS CATEGORY  50M: Post-Procedure Mammogram for Marker Placement Electronically Signed   By: Meda Klinefelter M.D.   On: 05/20/2023 09:20

## 2023-06-19 ENCOUNTER — Inpatient Hospital Stay: Payer: Medicare HMO | Admitting: Hospice and Palliative Medicine

## 2023-06-19 DIAGNOSIS — D0511 Intraductal carcinoma in situ of right breast: Secondary | ICD-10-CM

## 2023-06-19 NOTE — Progress Notes (Signed)
Multidisciplinary Oncology Council Documentation  Kim Ruiz was presented by our [facility] on 06/19/2023, which included representatives from:  Palliative Care Dietitian  Physical/Occupational Therapist Nurse Navigator Genetics Social work Survivorship RN Financial Navigator Research RN   Angelli currently presents with history of breast cancer  We reviewed previous medical and familial history, history of present illness, and recent lab results along with all available histopathologic and imaging studies. The MOC considered available treatment options and made the following recommendations/referrals:  Rehab screening  The MOC is a meeting of clinicians from various specialty areas who evaluate and discuss patients for whom a multidisciplinary approach is being considered. Final determinations in the plan of care are those of the provider(s).   Today's extended care, comprehensive team conference, Kim Ruiz was not present for the discussion and was not examined.

## 2023-06-25 ENCOUNTER — Inpatient Hospital Stay: Payer: Medicare HMO | Admitting: Licensed Clinical Social Worker

## 2023-06-25 ENCOUNTER — Inpatient Hospital Stay: Payer: Medicare HMO

## 2023-06-25 ENCOUNTER — Ambulatory Visit
Admission: RE | Admit: 2023-06-25 | Discharge: 2023-06-25 | Disposition: A | Discharge status: Home / Self Care | Payer: Medicare HMO | Source: Ambulatory Visit | Attending: Radiation Oncology | Admitting: Radiation Oncology

## 2023-06-25 ENCOUNTER — Encounter: Payer: Self-pay | Admitting: Radiation Oncology

## 2023-06-25 VITALS — BP 154/73 | HR 67 | Temp 98.0°F | Resp 16 | Ht 67.5 in | Wt 182.9 lb

## 2023-06-25 DIAGNOSIS — D0511 Intraductal carcinoma in situ of right breast: Secondary | ICD-10-CM

## 2023-06-25 DIAGNOSIS — Z803 Family history of malignant neoplasm of breast: Secondary | ICD-10-CM

## 2023-06-25 NOTE — Progress Notes (Signed)
REFERRING PROVIDER: Rickard Patience, MD [address] [place],  [place] [address]  PRIMARY PROVIDER:  Lorn Junes, FNP  PRIMARY REASON FOR VISIT:  1. Ductal carcinoma in situ (DCIS) of right breast   2. Family history of breast cancer      HISTORY OF PRESENT ILLNESS:   Kim Ruiz, a 72 y.o. female, was seen for a [place] cancer genetics consultation at the request of Dr. Cathie Hoops due to a personal and family history of cancer.  Ms. Waddles presents to clinic today to discuss the possibility of a hereditary predisposition to cancer, genetic testing, and to further clarify her future cancer risks, as well as potential cancer risks for family members.   CANCER HISTORY:  Oncology History  Ductal carcinoma in situ (DCIS) of right breast  04/30/2023 Imaging   Bilateral screening mammogram showed  In the right breast, calcifications warrant further evaluation with magnified views. In the left breast, no findings suspicious for malignancy.     05/10/2023 Mammogram   Right diagnostic mammogram showed Magnified views are performed of calcifications in the UPPER-OUTER QUADRANT of the RIGHT breast. These views demonstrate a 6 millimeter group of pleomorphic and linear calcifications.   05/24/2023 Initial Diagnosis   Ductal carcinoma in situ (DCIS) of right breast  05/20/2023  1. Breast, right, needle core biopsy, upper outer posterior depth, ribbon clip:  DCIS, high grade, comedo -type  Necrosis present, calcification present, fibrocystic changes, adenosis.    Menarche at age of 62 No children OCP use: No History of hysterectomy: yes  Menopausal status: postmenopausal History of HRT use: No History of chest radiation: No  Number of previous breast biopsies:  No   05/24/2023 Cancer Staging   Staging form: Breast, AJCC 8th Edition - Clinical stage from 05/24/2023: Stage 0 (cTis (DCIS), cN0, cM0) - Signed by Rickard Patience, MD on 05/24/2023 Stage prefix: Initial diagnosis Nuclear grade:  G3   05/31/2023 Surgery   Patient underwent lumpectomy and also got SLNB due to the upper outer quadrant location and being high-grade with comedonecrosis   1. Breast, lumpectomy, Right mass :      - RESIDUAL DUCTAL CARCINOMA IN SITU (DCIS).      - SEE CANCER SUMMARY BELOW.      - BIOPSY SITE CHANGE WITH ASSOCIATED CLIP.      - SCOUT TAG PRESENT.       2. Breast, excision, Anterior margin :      - BENIGN BREAST TISSUE; NEGATIVE FOR MALIGNANCY.       3. Breast, excision, Superior margin :      - BENIGN BREAST TISSUE; NEGATIVE FOR MALIGNANCY.       4. Lymph node, sentinel, biopsy, Right axillary #1 :      - ONE LYMPH NODE NEGATIVE FOR METASTATIC CARCINOMA (0/1).      - SEE NOTE.       5. Lymph node, sentinel, biopsy, right axillary #2 :      - ONE LYMPH NODE NEGATIVE FOR METASTATIC CARCINOMA (0/1).      - SEE NOTE.       6. Lymph node, sentinel, biopsy, right axillary #3 :      - ONE LYMPH NODE NEGATIVE FOR METASTATIC CARCINOMA (0/1).      - SEE NOTE.       7. Lymph node, sentinel, biopsy, right axillary #4 :      - ONE LYMPH NODE NEGATIVE FOR METASTATIC CARCINOMA (0/1).      - SEE NOTE.  MARGINS  Margin Status: All margins negative for DCIS by a distance of at least 2 mm   REGIONAL LYMPH NODES  Regional Lymph node Status: All regional lymph nodes negative for tumor  Total Number of Lymph Nodes Examined (sentinel and non-sentinel): 4  Number of Sentinel Nodes Examined: 4   ER 95% positive    In 2024, at the age of 59, [name] was diagnosed with DCIS of the right breast. The treatment plan includes lumpectomy and adjuvant radiation, adjuvant endocrine therapy.   RISK FACTORS:  Menarche was at age 46.  Ovaries intact: no.  Hysterectomy: yes.  Menopausal status: postmenopausal.  HRT use: 0 years. Colonoscopy: yes; normal.  Past Medical History:  Diagnosis Date   Arthritis    Dysrhythmia    Heart murmur    Hypertension     Past Surgical History:   Procedure Laterality Date   ABDOMINAL HYSTERECTOMY     BREAST BIOPSY Right 03/2013   BREAST BIOPSY Right 05/20/2023   MM RT BREAST BX W LOC DEV 1ST LESION IMAGE BX SPEC STEREO GUIDE 05/20/2023 [facility]   BREAST BIOPSY Right 05/30/2023   MM RT RADIO FREQUENCY TAG LOC MAMMO GUIDE 05/30/2023 [facility]   BREAST LUMPECTOMY,RADIO FREQ LOCALIZER,AXILLARY SENTINEL LYMPH NODE BIOPSY Right 05/30/2023   savi placement   CARPAL TUNNEL RELEASE Right    COLONOSCOPY     COLONOSCOPY WITH PROPOFOL N/A 03/01/2015   Procedure: COLONOSCOPY WITH PROPOFOL;  Surgeon: Midge Minium, MD;  Location: [facility];  Service: Endoscopy;  Laterality: N/A;   KNEE ARTHROSCOPY Right    KNEE ARTHROSCOPY WITH MEDIAL MENISECTOMY Right 02/01/2015   Procedure: KNEE ARTHROSCOPY WITH MEDIAL MENISECTOMY;  Surgeon: Myra Rude, MD;  Location: [facility];  Service: Orthopedics;  Laterality: Right;   PART MASTECTOMY,RADIO FREQUENCY LOCALIZER,AXILLARY SENTINEL NODE BIOPSY Right 05/31/2023   Procedure: PARTIAL MASTECTOMY,RADIO FREQUENCY LOCALIZER,AXILLARY SENTINEL NODE BIOPSY;  Surgeon: Carolan Shiver, MD;  Location: [facility];  Service: General;  Laterality: Right;    FAMILY HISTORY:  We obtained a detailed, 4-generation family history.  Significant diagnoses are listed below: Family History  Problem Relation Age of Onset   Heart attack Mother    Breast cancer Sister 52   Multiple myeloma Brother    Breast cancer Other        22's   [name] has 2 full sisters, 2 maternal half sisters, 1 brother. One sister had breast cancer at 6 and passed from it in her 52s. She had a daughter, patient's niece, who had breast cancer in her 77s and is living at 22, no genetic testing. Patient's brother died of multiple myeloma, unsure age.  Ms. Razavi mother passed at 4. No known cancers on this side of the family, limited information.  Ms. Florkowski father passed over age 31, no known cancers on  this side of the family either, limited information.  Ms. Goldwasser is unaware of previous family history of genetic testing for hereditary cancer risks. There is no reported Ashkenazi Jewish ancestry. There is no known consanguinity.    GENETIC COUNSELING ASSESSMENT: Ms. Pasche is a 72 y.o. female with a personal and family history of breast cancer which is somewhat suggestive of a hereditary cancer syndrome and predisposition to cancer. We, therefore, discussed and recommended the following at today's visit.   DISCUSSION: We discussed that approximately 10% of breast cancer is hereditary. Most cases of hereditary breast cancer are associated with BRCA1/BRCA2 genes, although there are other genes associated with hereditary cancer as well. Cancers and  risks are gene specific. We discussed that testing is beneficial for several reasons including knowing about cancer risks, identifying potential screening and risk-reduction options that may be appropriate, and to understand if other family members could be at risk for cancer and allow them to undergo genetic testing.   We reviewed the characteristics, features and inheritance patterns of hereditary cancer syndromes. We also discussed genetic testing, including the appropriate family members to test, the process of testing, insurance coverage and turn-around-time for results. We discussed the implications of a negative, positive and/or variant of uncertain significant result. We recommended [name] pursue genetic testing for the [employer] Common Hereditary Cancers+RNA gene panel.   Based on [name]'s personal and family history of cancer, she meets medical criteria for genetic testing. Despite that she meets criteria, she may still have an out of pocket cost.   PLAN: After considering the risks, benefits, and limitations, Ms. Ambroise did not wish to pursue genetic testing at today's visit. She is concerned about cost and would  like to potentially have testing in January when she has different insurance. We understand this decision and remain available to coordinate genetic testing at any time in the future. We, therefore, recommend [name] continue to follow the cancer screening guidelines given by her primary healthcare provider.  Based on [name]'s family history, we recommended her niece who had breast cancer in her 24s have genetic counseling and testing. Ms. Carnathan will let us know if we can be of any assistance in coordinating genetic counseling and/or testing for this family member.   Ms. Kenfield questions were answered to her satisfaction today. Our contact information was provided should additional questions or concerns arise. Thank you for the referral and allowing [place] to share in the care of your patient.   Lacy Duverney, MS, [facility] Genetic Counselor [email or web address] Phone: [phone]  The patient was seen for a total of 15 minutes in face-to-face genetic counseling.  Dr. Blake Divine was available for discussion regarding this case.   _______________________________________________________________________ For Office Staff:  Number of people involved in session: 1 Was an Intern/ student involved with case: no

## 2023-06-25 NOTE — Consult Note (Signed)
NEW PATIENT EVALUATION  Name: Kim Ruiz  MRN: [id number]  Date:   06/25/2023     DOB: 09-26-1950   This 72 y.o. female patient presents to the clinic for initial evaluation of stage 0 (pTis N0 M0) ER positive ductal carcinoma in situ of the right breast status post wide local excision.  REFERRING PHYSICIAN: Lorn Junes, FNP  CHIEF COMPLAINT:  Chief Complaint  Patient presents with   Breast Cancer    DIAGNOSIS: The encounter diagnosis was Ductal carcinoma in situ (DCIS) of right breast.   PREVIOUS INVESTIGATIONS:  Mammogram ultrasound reviewed Pathology reports reviewed Clinical notes reviewed  HPI: Patient is a 72 year old female who presents with an abnormal mammogram of the right breast.  There were area of 6 mm of pleomorphic and linear calcifications for which biopsy was recommended.  Core biopsy was positive for ductal carcinoma in situ.  She underwent a wide local excision and sentinel node biopsy showing high grade 3 DCIS at least 1.2 cm with expansive comedonecrosis present.  All margins were clear of DCIS by at least 2 mm.  4 sentinel nodes were examined all negative for metastatic disease.  Tumor was ER positive.  Patient is tolerating her treatments well she states her breast is still somewhat sensitive.  She is now referred to radiation oncology for consideration of treatment.  PLANNED TREATMENT REGIMEN: Hypofractionated right whole breast radiation  PAST MEDICAL HISTORY:  has a past medical history of Arthritis, Dysrhythmia, Heart murmur, and Hypertension.    PAST SURGICAL HISTORY:  Past Surgical History:  Procedure Laterality Date   ABDOMINAL HYSTERECTOMY     BREAST BIOPSY Right 03/2013   BREAST BIOPSY Right 05/20/2023   MM RT BREAST BX W LOC DEV 1ST LESION IMAGE BX SPEC STEREO GUIDE 05/20/2023 [facility]   BREAST BIOPSY Right 05/30/2023   MM RT RADIO FREQUENCY TAG LOC MAMMO GUIDE 05/30/2023 [facility]   BREAST LUMPECTOMY,RADIO FREQ  LOCALIZER,AXILLARY SENTINEL LYMPH NODE BIOPSY Right 05/30/2023   savi placement   CARPAL TUNNEL RELEASE Right    COLONOSCOPY     COLONOSCOPY WITH PROPOFOL N/A 03/01/2015   Procedure: COLONOSCOPY WITH PROPOFOL;  Surgeon: Midge Minium, MD;  Location: [facility];  Service: Endoscopy;  Laterality: N/A;   KNEE ARTHROSCOPY Right    KNEE ARTHROSCOPY WITH MEDIAL MENISECTOMY Right 02/01/2015   Procedure: KNEE ARTHROSCOPY WITH MEDIAL MENISECTOMY;  Surgeon: Myra Rude, MD;  Location: [facility];  Service: Orthopedics;  Laterality: Right;   PART MASTECTOMY,RADIO FREQUENCY LOCALIZER,AXILLARY SENTINEL NODE BIOPSY Right 05/31/2023   Procedure: PARTIAL MASTECTOMY,RADIO FREQUENCY LOCALIZER,AXILLARY SENTINEL NODE BIOPSY;  Surgeon: Carolan Shiver, MD;  Location: [facility];  Service: General;  Laterality: Right;    FAMILY HISTORY: family history includes Breast cancer in an other family member; Breast cancer (age of onset: 57) in her sister; Heart attack in her mother; Multiple myeloma in her brother.  SOCIAL HISTORY:  reports that she quit smoking about 36 years ago. Her smoking use included cigarettes. She started smoking about 49 years ago. She has a 3.3 pack-year smoking history. She does not have any smokeless tobacco history on file. She reports that she does not drink alcohol and does not use drugs.  ALLERGIES: Patient has no known allergies.  MEDICATIONS:  Current Outpatient Medications  Medication Sig Dispense Refill   amLODipine (NORVASC) 10 MG tablet Take 10 mg by mouth daily.     hydrochlorothiazide (HYDRODIURIL) 25 MG tablet Take 25 mg by mouth daily.     ibuprofen (ADVIL) 800 MG tablet Take  800 mg by mouth every 8 (eight) hours as needed.     acyclovir (ZOVIRAX) 400 MG tablet Take 400 mg by mouth 2 (two) times daily. (Patient not taking: Reported on 05/24/2023)     gabapentin (NEURONTIN) 100 MG capsule Take 100 mg by mouth as needed. (Patient not taking: Reported on 06/25/2023)      No current facility-administered medications for this encounter.   Facility-Administered Medications Ordered in Other Encounters  Medication Dose Route Frequency Provider Last Rate Last Admin   dexamethasone (DECADRON) injection   Intravenous Anesthesia Intra-op Emeterio Reeve, CRNA   10 mg at 05/31/23 1238   dexmedetomidine (PRECEDEX) 80 MCG/20ML   Intravenous Anesthesia Intra-op Emeterio Reeve, CRNA   4 mcg at 05/31/23 1219   fentaNYL (SUBLIMAZE) injection   Intravenous Anesthesia Intra-op Emeterio Reeve, CRNA   50 mcg at 05/31/23 1321   lidocaine (cardiac) 100 mg/93mL (XYLOCAINE) injection 2%   Intravenous Anesthesia Intra-op Emeterio Reeve, CRNA   80 mg at 05/31/23 1135   midazolam (VERSED) injection   Intravenous Anesthesia Intra-op Emeterio Reeve, CRNA   1 mg at 05/31/23 1129   ondansetron (ZOFRAN) injection   Intravenous Anesthesia Intra-op Emeterio Reeve, CRNA   4 mg at 05/31/23 1238   propofol (DIPRIVAN) 10 mg/mL bolus/IV push   Intravenous Anesthesia Intra-op Emeterio Reeve, CRNA   150 mg at 05/31/23 1135   rocuronium (ZEMURON) injection   Intravenous Anesthesia Intra-op Emeterio Reeve, CRNA   50 mg at 05/31/23 1135   sugammadex sodium (BRIDION) injection   Intravenous Anesthesia Intra-op Emeterio Reeve, CRNA   161.4 mg at 05/31/23 1307    ECOG PERFORMANCE STATUS:  0 - Asymptomatic  REVIEW OF SYSTEMS: Patient denies any weight loss, fatigue, weakness, fever, chills or night sweats. Patient denies any loss of vision, blurred vision. Patient denies any ringing  of the ears or hearing loss. No irregular heartbeat. Patient denies heart murmur or history of fainting. Patient denies any chest pain or pain radiating to her upper extremities. Patient denies any shortness of breath, difficulty breathing at night, cough or hemoptysis. Patient denies any swelling in the lower legs. Patient denies any nausea vomiting, vomiting of blood, or coffee ground material  in the vomitus. Patient denies any stomach pain. Patient states has had normal bowel movements no significant constipation or diarrhea. Patient denies any dysuria, hematuria or significant nocturia. Patient denies any problems walking, swelling in the joints or loss of balance. Patient denies any skin changes, loss of hair or loss of weight. Patient denies any excessive worrying or anxiety or significant depression. Patient denies any problems with insomnia. Patient denies excessive thirst, polyuria, polydipsia. Patient denies any swollen glands, patient denies easy bruising or easy bleeding. Patient denies any recent infections, allergies or URI. Patient "s visual fields have not changed significantly in recent time.   PHYSICAL EXAM: BP (!) 154/73 Comment: patient has not taken BP meds is about a week  Pulse 67   Temp 98 F (36.7 C) (Tympanic)   Resp 16   Ht 5' 7.5" (1.715 m) Comment: stated ht  Wt 182 lb 14.4 oz (83 kg)   BMI 28.22 kg/m  Right breast is wide local excision which is healing well.  There are some firmness of the breast possibly secondary to a seroma.  No other dominant masses noted in either breast.  No axillary or supraclavicular adenopathy is appreciated.  Well-developed well-nourished patient in NAD. HEENT reveals PERLA, EOMI, discs not visualized.  Oral cavity is clear. No oral  mucosal lesions are identified. Neck is clear without evidence of cervical or supraclavicular adenopathy. Lungs are clear to A&P. Cardiac examination is essentially unremarkable with regular rate and rhythm without murmur rub or thrill. Abdomen is benign with no organomegaly or masses noted. Motor sensory and DTR levels are equal and symmetric in the upper and lower extremities. Cranial nerves II through XII are grossly intact. Proprioception is intact. No peripheral adenopathy or edema is identified. No motor or sensory levels are noted. Crude visual fields are within normal range.  LABORATORY DATA:  Pathology reports reviewed    RADIOLOGY RESULTS: Mammograms reviewed   IMPRESSION: Stage 0 ER positive ductal carcinoma site of the right breast status post wide local excision in 72 year old female  PLAN: Present time I have recommended a hypofractionated course of whole breast radiation to her right breast.  Will treat her breast in 3 weeks boosting her scar another 1000 centigrade using photon beam.  Risks and benefits of treatment occluding skin reaction fatigue alteration blood counts possible inclusion of superficial lung all were reviewed in detail with the patient.  I have personally set up and ordered CT simulation for next week.  Patient also will benefit from endocrine therapy after completion of radiation.  I would like to take this opportunity to thank you for allowing me to participate in the care of your patient.Carmina Miller, MD

## 2023-06-27 ENCOUNTER — Other Ambulatory Visit: Payer: Self-pay | Admitting: Radiation Oncology

## 2023-06-27 DIAGNOSIS — C801 Malignant (primary) neoplasm, unspecified: Secondary | ICD-10-CM

## 2023-07-02 ENCOUNTER — Ambulatory Visit
Admission: RE | Admit: 2023-07-02 | Discharge: 2023-07-02 | Disposition: A | Discharge status: Home / Self Care | Payer: Medicare HMO | Source: Ambulatory Visit | Attending: Radiation Oncology | Admitting: Radiation Oncology

## 2023-07-02 ENCOUNTER — Encounter: Payer: Self-pay | Admitting: *Deleted

## 2023-07-02 DIAGNOSIS — C801 Malignant (primary) neoplasm, unspecified: Secondary | ICD-10-CM

## 2023-07-02 DIAGNOSIS — D0511 Intraductal carcinoma in situ of right breast: Secondary | ICD-10-CM | POA: Diagnosis not present

## 2023-07-03 ENCOUNTER — Ambulatory Visit: Payer: Medicare HMO | Attending: Cardiology | Admitting: Cardiology

## 2023-07-03 ENCOUNTER — Telehealth: Payer: Self-pay

## 2023-07-03 ENCOUNTER — Inpatient Hospital Stay: Payer: Medicare HMO | Admitting: Occupational Therapy

## 2023-07-03 ENCOUNTER — Encounter: Payer: Self-pay | Admitting: Cardiology

## 2023-07-03 DIAGNOSIS — L905 Scar conditions and fibrosis of skin: Secondary | ICD-10-CM

## 2023-07-03 NOTE — Telephone Encounter (Signed)
Final Rad tx scheduled on 12/6. Please schedule MD only approx 2 weeks after that date and inform pt of appt details.

## 2023-07-03 NOTE — Therapy (Signed)
[facility] [address], [address] [place], [place], [address] Phone: [phone]   Fax:  [phone]  Occupational Therapy Screen  Patient Details  Name: Kim Ruiz MRN: [id number] Date of Birth: March 18, 1951 No data recorded  Encounter Date: 07/03/2023   OT End of Session - 07/03/23 1200     Visit Number 0             Past Medical History:  Diagnosis Date   Arthritis    Dysrhythmia    Heart murmur    Hypertension     Past Surgical History:  Procedure Laterality Date   ABDOMINAL HYSTERECTOMY     BREAST BIOPSY Right 03/2013   BREAST BIOPSY Right 05/20/2023   MM RT BREAST BX W LOC DEV 1ST LESION IMAGE BX SPEC STEREO GUIDE 05/20/2023 [facility]   BREAST BIOPSY Right 05/30/2023   MM RT RADIO FREQUENCY TAG LOC MAMMO GUIDE 05/30/2023 [facility]   BREAST LUMPECTOMY,RADIO FREQ LOCALIZER,AXILLARY SENTINEL LYMPH NODE BIOPSY Right 05/30/2023   savi placement   CARPAL TUNNEL RELEASE Right    COLONOSCOPY     COLONOSCOPY WITH PROPOFOL N/A 03/01/2015   Procedure: COLONOSCOPY WITH PROPOFOL;  Surgeon: Midge Minium, MD;  Location: [facility];  Service: Endoscopy;  Laterality: N/A;   KNEE ARTHROSCOPY Right    KNEE ARTHROSCOPY WITH MEDIAL MENISECTOMY Right 02/01/2015   Procedure: KNEE ARTHROSCOPY WITH MEDIAL MENISECTOMY;  Surgeon: Myra Rude, MD;  Location: [facility];  Service: Orthopedics;  Laterality: Right;   PART MASTECTOMY,RADIO FREQUENCY LOCALIZER,AXILLARY SENTINEL NODE BIOPSY Right 05/31/2023   Procedure: PARTIAL MASTECTOMY,RADIO FREQUENCY LOCALIZER,AXILLARY SENTINEL NODE BIOPSY;  Surgeon: Carolan Shiver, MD;  Location: [facility];  Service: General;  Laterality: Right;    There were no vitals filed for this visit.      [facility] OT Assessment - 07/03/23 0001       AROM   Right Shoulder Flexion 140 Degrees   after manual 160   Right Shoulder ABduction 115 Degrees   after manual 150   Right  Shoulder External Rotation 85 Degrees              LYMPHEDEMA/ONCOLOGY QUESTIONNAIRE - 07/03/23 0001       Right Upper Extremity Lymphedema   15 cm Proximal to Olecranon Process 33 cm    10 cm Proximal to Olecranon Process 29 cm    Olecranon Process 26.5 cm    15 cm Proximal to Ulnar Styloid Process 23.6 cm    10 cm Proximal to Ulnar Styloid Process 20.2 cm    Just Proximal to Ulnar Styloid Process 17.4 cm      Left Upper Extremity Lymphedema   15 cm Proximal to Olecranon Process 30.5 cm    10 cm Proximal to Olecranon Process 29 cm    Olecranon Process 25.5 cm    15 cm Proximal to Ulnar Styloid Process 23 cm    10 cm Proximal to Ulnar Styloid Process 19.5 cm    Just Proximal to Ulnar Styloid Process 17 cm             IMPRESSION Dr Maia Plan 06/13/23:   Malignant neoplasm of upper-outer quadrant of right breast in female, estrogen receptor positive (CMS/HHS-HCC) [C50.411, Z17.0] -S/p partial mastectomy with sentinel lymph node biopsy -Final pathology shows residual DCIS, high-grade, negative margins. -Sentinel lymph nodes were negative -Patient will be evaluated by medical oncology for adjuvant radiation therapy and antiestrogen therapy   PLAN:  1. Continue the great care is doing 2.  Expect appointment with Dr. Cathie Hoops on 06/17/2023 at 1:30 PM for discussion of radiation therapy and antiestrogen therapy 3. I will see you in 4 weeks for wound follow-up with contact us if you have any concern   OT SCREEN 07/03/23:   I had my simulation for my radiation yesterday.  Doing okay but I feel this pull in my right armpit all the way down my arm when I reach overhead. I am retired - my granddaughter lives with me.  I am right-hand-dominant. Circumference measurements was taken proximal upper arm measurement increased by 2.5 we will continue to reassess but risk is low for lymphedema. Had normal motion and strength before in my right arm. Upon assessment patient with decreased  shoulder abduction and flexion with increased pull in right axilla into upper arm and forearm. Upon further assessment appear patient has some lymphatic cording from scar tissue down the arm. Was able to release some of her cording resulting in less of pull and increase overhead shoulder flexion and abduction. Patient was educated on a home program to assist with some manual facilitation and motion. In supine with trunk rotation to the left As well as shoulder flexion and abduction active assisted range of motion on the wall. Patient will follow-up with me next week after first or second radiation session.                                   Visit Diagnosis: Scar condition and fibrosis of skin    Problem List Patient Active Problem List   Diagnosis Date Noted   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/24/2023   Goals of care, counseling/discussion 05/24/2023   Family history of breast cancer 05/24/2023   Abnormal gait 02/29/2020   Knee pain 02/29/2020   Knee stiff 02/29/2020   Tear of medial meniscus of knee 02/29/2020   Special screening for malignant neoplasms, colon    Benign neoplasm of sigmoid colon    Benign neoplasm of ascending colon     Oletta Cohn, OTR/L,CLT 07/03/2023, 12:03 PM  [place] [address] [place], [place], [address] Phone: [phone]   Fax:  [phone]  Name: Darlina Shiverdecker MRN: [id number] Date of Birth: [birth year]

## 2023-07-04 DIAGNOSIS — D0511 Intraductal carcinoma in situ of right breast: Secondary | ICD-10-CM | POA: Diagnosis not present

## 2023-07-05 ENCOUNTER — Other Ambulatory Visit: Payer: Self-pay | Admitting: *Deleted

## 2023-07-05 DIAGNOSIS — D0511 Intraductal carcinoma in situ of right breast: Secondary | ICD-10-CM

## 2023-07-05 NOTE — Progress Notes (Signed)
labs

## 2023-07-08 ENCOUNTER — Ambulatory Visit: Payer: Medicare HMO

## 2023-07-09 ENCOUNTER — Ambulatory Visit
Admission: RE | Admit: 2023-07-09 | Discharge: 2023-07-09 | Disposition: A | Discharge status: Home / Self Care | Payer: Medicare HMO | Source: Ambulatory Visit | Attending: Radiation Oncology | Admitting: Radiation Oncology

## 2023-07-09 DIAGNOSIS — Z510 Encounter for antineoplastic radiation therapy: Secondary | ICD-10-CM | POA: Insufficient documentation

## 2023-07-09 DIAGNOSIS — D0511 Intraductal carcinoma in situ of right breast: Secondary | ICD-10-CM | POA: Diagnosis present

## 2023-07-10 ENCOUNTER — Other Ambulatory Visit: Payer: Self-pay

## 2023-07-10 ENCOUNTER — Inpatient Hospital Stay: Payer: Medicare HMO | Attending: Oncology | Admitting: Occupational Therapy

## 2023-07-10 ENCOUNTER — Ambulatory Visit
Admission: RE | Admit: 2023-07-10 | Discharge: 2023-07-10 | Disposition: A | Discharge status: Home / Self Care | Payer: Medicare HMO | Source: Ambulatory Visit | Attending: Radiation Oncology | Admitting: Radiation Oncology

## 2023-07-10 DIAGNOSIS — D0511 Intraductal carcinoma in situ of right breast: Secondary | ICD-10-CM | POA: Insufficient documentation

## 2023-07-10 DIAGNOSIS — Z510 Encounter for antineoplastic radiation therapy: Secondary | ICD-10-CM | POA: Diagnosis not present

## 2023-07-10 LAB — RAD ONC ARIA SESSION SUMMARY
Course Elapsed Days: 0
Plan Fractions Treated to Date: 1
Plan Prescribed Dose Per Fraction: 2.66 Gy
Plan Total Fractions Prescribed: 16
Plan Total Prescribed Dose: 42.56 Gy
Reference Point Dosage Given to Date: 2.66 Gy
Reference Point Session Dosage Given: 2.66 Gy
Session Number: 1

## 2023-07-11 ENCOUNTER — Ambulatory Visit
Admission: RE | Admit: 2023-07-11 | Discharge: 2023-07-11 | Disposition: A | Discharge status: Home / Self Care | Payer: Medicare HMO | Source: Ambulatory Visit | Attending: Radiation Oncology | Admitting: Radiation Oncology

## 2023-07-11 ENCOUNTER — Other Ambulatory Visit: Payer: Self-pay

## 2023-07-11 DIAGNOSIS — Z510 Encounter for antineoplastic radiation therapy: Secondary | ICD-10-CM | POA: Diagnosis not present

## 2023-07-11 LAB — RAD ONC ARIA SESSION SUMMARY
Course Elapsed Days: 1
Plan Fractions Treated to Date: 2
Plan Prescribed Dose Per Fraction: 2.66 Gy
Plan Total Fractions Prescribed: 16
Plan Total Prescribed Dose: 42.56 Gy
Reference Point Dosage Given to Date: 5.32 Gy
Reference Point Session Dosage Given: 2.66 Gy
Session Number: 2

## 2023-07-12 ENCOUNTER — Ambulatory Visit
Admission: RE | Admit: 2023-07-12 | Discharge: 2023-07-12 | Disposition: A | Discharge status: Home / Self Care | Payer: Medicare HMO | Source: Ambulatory Visit | Attending: Radiation Oncology | Admitting: Radiation Oncology

## 2023-07-12 ENCOUNTER — Other Ambulatory Visit: Payer: Self-pay

## 2023-07-12 DIAGNOSIS — Z510 Encounter for antineoplastic radiation therapy: Secondary | ICD-10-CM | POA: Diagnosis not present

## 2023-07-12 LAB — RAD ONC ARIA SESSION SUMMARY
Course Elapsed Days: 2
Plan Fractions Treated to Date: 3
Plan Prescribed Dose Per Fraction: 2.66 Gy
Plan Total Fractions Prescribed: 16
Plan Total Prescribed Dose: 42.56 Gy
Reference Point Dosage Given to Date: 7.98 Gy
Reference Point Session Dosage Given: 2.66 Gy
Session Number: 3

## 2023-07-15 ENCOUNTER — Other Ambulatory Visit: Payer: Self-pay

## 2023-07-15 ENCOUNTER — Ambulatory Visit
Admission: RE | Admit: 2023-07-15 | Discharge: 2023-07-15 | Disposition: A | Discharge status: Home / Self Care | Payer: Medicare HMO | Source: Ambulatory Visit | Attending: Radiation Oncology | Admitting: Radiation Oncology

## 2023-07-15 DIAGNOSIS — Z510 Encounter for antineoplastic radiation therapy: Secondary | ICD-10-CM | POA: Diagnosis not present

## 2023-07-15 LAB — RAD ONC ARIA SESSION SUMMARY
Course Elapsed Days: 5
Plan Fractions Treated to Date: 4
Plan Prescribed Dose Per Fraction: 2.66 Gy
Plan Total Fractions Prescribed: 16
Plan Total Prescribed Dose: 42.56 Gy
Reference Point Dosage Given to Date: 10.64 Gy
Reference Point Session Dosage Given: 2.66 Gy
Session Number: 4

## 2023-07-16 ENCOUNTER — Other Ambulatory Visit: Payer: Self-pay

## 2023-07-16 ENCOUNTER — Ambulatory Visit
Admission: RE | Admit: 2023-07-16 | Discharge: 2023-07-16 | Disposition: A | Discharge status: Home / Self Care | Payer: Medicare HMO | Source: Ambulatory Visit | Attending: Radiation Oncology | Admitting: Radiation Oncology

## 2023-07-16 DIAGNOSIS — Z510 Encounter for antineoplastic radiation therapy: Secondary | ICD-10-CM | POA: Diagnosis not present

## 2023-07-16 LAB — RAD ONC ARIA SESSION SUMMARY
Course Elapsed Days: 6
Plan Fractions Treated to Date: 5
Plan Prescribed Dose Per Fraction: 2.66 Gy
Plan Total Fractions Prescribed: 16
Plan Total Prescribed Dose: 42.56 Gy
Reference Point Dosage Given to Date: 13.3 Gy
Reference Point Session Dosage Given: 2.66 Gy
Session Number: 5

## 2023-07-17 ENCOUNTER — Other Ambulatory Visit: Payer: Self-pay

## 2023-07-17 ENCOUNTER — Ambulatory Visit
Admission: RE | Admit: 2023-07-17 | Discharge: 2023-07-17 | Disposition: A | Discharge status: Home / Self Care | Payer: Medicare HMO | Source: Ambulatory Visit | Attending: Radiation Oncology | Admitting: Radiation Oncology

## 2023-07-17 ENCOUNTER — Inpatient Hospital Stay: Payer: Medicare HMO

## 2023-07-17 DIAGNOSIS — D0511 Intraductal carcinoma in situ of right breast: Secondary | ICD-10-CM

## 2023-07-17 DIAGNOSIS — Z510 Encounter for antineoplastic radiation therapy: Secondary | ICD-10-CM | POA: Diagnosis not present

## 2023-07-17 LAB — RAD ONC ARIA SESSION SUMMARY
Course Elapsed Days: 7
Plan Fractions Treated to Date: 6
Plan Prescribed Dose Per Fraction: 2.66 Gy
Plan Total Fractions Prescribed: 16
Plan Total Prescribed Dose: 42.56 Gy
Reference Point Dosage Given to Date: 15.96 Gy
Reference Point Session Dosage Given: 2.66 Gy
Session Number: 6

## 2023-07-17 LAB — CBC (CANCER CENTER ONLY)
HCT: 43.3 % (ref 36.0–46.0)
Hemoglobin: 13.7 g/dL (ref 12.0–15.0)
MCH: 25.6 pg — ABNORMAL LOW (ref 26.0–34.0)
MCHC: 31.6 g/dL (ref 30.0–36.0)
MCV: 80.8 fL (ref 80.0–100.0)
Platelet Count: 232 10*3/uL (ref 150–400)
RBC: 5.36 MIL/uL — ABNORMAL HIGH (ref 3.87–5.11)
RDW: 14.1 % (ref 11.5–15.5)
WBC Count: 7.1 10*3/uL (ref 4.0–10.5)
nRBC: 0 % (ref 0.0–0.2)

## 2023-07-18 ENCOUNTER — Other Ambulatory Visit: Payer: Self-pay

## 2023-07-18 ENCOUNTER — Ambulatory Visit
Admission: RE | Admit: 2023-07-18 | Discharge: 2023-07-18 | Disposition: A | Discharge status: Home / Self Care | Payer: Medicare HMO | Source: Ambulatory Visit | Attending: Radiation Oncology | Admitting: Radiation Oncology

## 2023-07-18 DIAGNOSIS — Z510 Encounter for antineoplastic radiation therapy: Secondary | ICD-10-CM | POA: Diagnosis not present

## 2023-07-18 LAB — RAD ONC ARIA SESSION SUMMARY
Course Elapsed Days: 8
Plan Fractions Treated to Date: 7
Plan Prescribed Dose Per Fraction: 2.66 Gy
Plan Total Fractions Prescribed: 16
Plan Total Prescribed Dose: 42.56 Gy
Reference Point Dosage Given to Date: 18.62 Gy
Reference Point Session Dosage Given: 2.66 Gy
Session Number: 7

## 2023-07-19 ENCOUNTER — Ambulatory Visit
Admission: RE | Admit: 2023-07-19 | Discharge: 2023-07-19 | Disposition: A | Discharge status: Home / Self Care | Payer: Medicare HMO | Source: Ambulatory Visit | Attending: Radiation Oncology | Admitting: Radiation Oncology

## 2023-07-19 ENCOUNTER — Other Ambulatory Visit: Payer: Self-pay

## 2023-07-19 DIAGNOSIS — Z510 Encounter for antineoplastic radiation therapy: Secondary | ICD-10-CM | POA: Diagnosis not present

## 2023-07-19 LAB — RAD ONC ARIA SESSION SUMMARY
Course Elapsed Days: 9
Plan Fractions Treated to Date: 8
Plan Prescribed Dose Per Fraction: 2.66 Gy
Plan Total Fractions Prescribed: 16
Plan Total Prescribed Dose: 42.56 Gy
Reference Point Dosage Given to Date: 21.28 Gy
Reference Point Session Dosage Given: 2.66 Gy
Session Number: 8

## 2023-07-22 ENCOUNTER — Other Ambulatory Visit: Payer: Self-pay

## 2023-07-22 ENCOUNTER — Ambulatory Visit
Admission: RE | Admit: 2023-07-22 | Discharge: 2023-07-22 | Disposition: A | Discharge status: Home / Self Care | Payer: Medicare HMO | Source: Ambulatory Visit | Attending: Radiation Oncology | Admitting: Radiation Oncology

## 2023-07-22 DIAGNOSIS — Z510 Encounter for antineoplastic radiation therapy: Secondary | ICD-10-CM | POA: Diagnosis not present

## 2023-07-22 LAB — RAD ONC ARIA SESSION SUMMARY
Course Elapsed Days: 12
Plan Fractions Treated to Date: 9
Plan Prescribed Dose Per Fraction: 2.66 Gy
Plan Total Fractions Prescribed: 16
Plan Total Prescribed Dose: 42.56 Gy
Reference Point Dosage Given to Date: 23.94 Gy
Reference Point Session Dosage Given: 2.66 Gy
Session Number: 9

## 2023-07-23 ENCOUNTER — Other Ambulatory Visit: Payer: Self-pay

## 2023-07-23 ENCOUNTER — Ambulatory Visit
Admission: RE | Admit: 2023-07-23 | Discharge: 2023-07-23 | Disposition: A | Discharge status: Home / Self Care | Payer: Medicare HMO | Source: Ambulatory Visit | Attending: Radiation Oncology | Admitting: Radiation Oncology

## 2023-07-23 DIAGNOSIS — Z510 Encounter for antineoplastic radiation therapy: Secondary | ICD-10-CM | POA: Diagnosis not present

## 2023-07-23 LAB — RAD ONC ARIA SESSION SUMMARY
Course Elapsed Days: 13
Plan Fractions Treated to Date: 10
Plan Prescribed Dose Per Fraction: 2.66 Gy
Plan Total Fractions Prescribed: 16
Plan Total Prescribed Dose: 42.56 Gy
Reference Point Dosage Given to Date: 26.6 Gy
Reference Point Session Dosage Given: 2.66 Gy
Session Number: 10

## 2023-07-24 ENCOUNTER — Ambulatory Visit
Admission: RE | Admit: 2023-07-24 | Discharge: 2023-07-24 | Disposition: A | Discharge status: Home / Self Care | Payer: Medicare HMO | Source: Ambulatory Visit | Attending: Radiation Oncology | Admitting: Radiation Oncology

## 2023-07-24 ENCOUNTER — Other Ambulatory Visit: Payer: Self-pay

## 2023-07-24 DIAGNOSIS — Z510 Encounter for antineoplastic radiation therapy: Secondary | ICD-10-CM | POA: Diagnosis not present

## 2023-07-24 LAB — RAD ONC ARIA SESSION SUMMARY
Course Elapsed Days: 14
Plan Fractions Treated to Date: 11
Plan Prescribed Dose Per Fraction: 2.66 Gy
Plan Total Fractions Prescribed: 16
Plan Total Prescribed Dose: 42.56 Gy
Reference Point Dosage Given to Date: 29.26 Gy
Reference Point Session Dosage Given: 2.66 Gy
Session Number: 11

## 2023-07-25 ENCOUNTER — Other Ambulatory Visit: Payer: Self-pay

## 2023-07-25 ENCOUNTER — Ambulatory Visit
Admission: RE | Admit: 2023-07-25 | Discharge: 2023-07-25 | Disposition: A | Discharge status: Home / Self Care | Payer: Medicare HMO | Source: Ambulatory Visit | Attending: Radiation Oncology | Admitting: Radiation Oncology

## 2023-07-25 DIAGNOSIS — Z510 Encounter for antineoplastic radiation therapy: Secondary | ICD-10-CM | POA: Diagnosis not present

## 2023-07-25 LAB — RAD ONC ARIA SESSION SUMMARY
Course Elapsed Days: 15
Plan Fractions Treated to Date: 12
Plan Prescribed Dose Per Fraction: 2.66 Gy
Plan Total Fractions Prescribed: 16
Plan Total Prescribed Dose: 42.56 Gy
Reference Point Dosage Given to Date: 31.92 Gy
Reference Point Session Dosage Given: 2.66 Gy
Session Number: 12

## 2023-07-26 ENCOUNTER — Other Ambulatory Visit: Payer: Self-pay

## 2023-07-26 ENCOUNTER — Ambulatory Visit
Admission: RE | Admit: 2023-07-26 | Discharge: 2023-07-26 | Disposition: A | Discharge status: Home / Self Care | Payer: Medicare HMO | Source: Ambulatory Visit | Attending: Radiation Oncology | Admitting: Radiation Oncology

## 2023-07-26 DIAGNOSIS — Z510 Encounter for antineoplastic radiation therapy: Secondary | ICD-10-CM | POA: Diagnosis not present

## 2023-07-26 LAB — RAD ONC ARIA SESSION SUMMARY
Course Elapsed Days: 16
Plan Fractions Treated to Date: 13
Plan Prescribed Dose Per Fraction: 2.66 Gy
Plan Total Fractions Prescribed: 16
Plan Total Prescribed Dose: 42.56 Gy
Reference Point Dosage Given to Date: 34.58 Gy
Reference Point Session Dosage Given: 2.66 Gy
Session Number: 13

## 2023-07-29 ENCOUNTER — Other Ambulatory Visit: Payer: Self-pay

## 2023-07-29 ENCOUNTER — Ambulatory Visit
Admission: RE | Admit: 2023-07-29 | Discharge: 2023-07-29 | Disposition: A | Discharge status: Home / Self Care | Payer: Medicare HMO | Source: Ambulatory Visit | Attending: Radiation Oncology | Admitting: Radiation Oncology

## 2023-07-29 DIAGNOSIS — Z510 Encounter for antineoplastic radiation therapy: Secondary | ICD-10-CM | POA: Diagnosis not present

## 2023-07-29 LAB — RAD ONC ARIA SESSION SUMMARY
Course Elapsed Days: 19
Plan Fractions Treated to Date: 14
Plan Prescribed Dose Per Fraction: 2.66 Gy
Plan Total Fractions Prescribed: 16
Plan Total Prescribed Dose: 42.56 Gy
Reference Point Dosage Given to Date: 37.24 Gy
Reference Point Session Dosage Given: 2.66 Gy
Session Number: 14

## 2023-07-29 NOTE — Progress Notes (Signed)
 New Patient Visit   Chief Complaint: No chief complaint on file.  Date of Service: 07/29/2023 Date of Birth: Jan 04, 1951 PCP: Rollene Therisa Norris, NP [address] [facility] [address] [facility] [place] [address]  History of Present Illness:   Kim Ruiz is a 72 y.o.female patient with past medical history significant for hypertension and hyperlipidemia who is here for a follow up visit. Patient has been doing well lately. No concerns or complaints today. Blood pressure is very well controlled. No anginal symptoms with activity or heavy exertion. Patient is tolerating medications without issues. Patient denies chest pain, shortness of breath, palpitations, diaphoresis, syncope, edema, PND, orthopnea.    Past Medical and Surgical History  Past Medical History Past Medical History:  Diagnosis Date  . Heart murmur   . Hyperlipidemia   . Hypertension     Past Surgical History She has a past surgical history that includes knee surgery 2014 Dr. Claudene (Right) and Mastectomy Partial (Right, 05/31/2023).   Medications and Allergies  Current Medications  Current Outpatient Medications on File Prior to Visit  Medication Sig Dispense Refill  . amLODIPine (NORVASC) 5 MG tablet TAKE 1 TABLET(5 MG) BY MOUTH EVERY DAY 30 tablet 11  . calcium  carbonate-vitamin D3 (CALTRATE 600+D) 600 mg-10 mcg (400 unit) tablet Take 1 tablet by mouth as needed    . gabapentin (NEURONTIN) 600 MG tablet Take 600mg  once daily as needed for head/neck pain. Do not take with other sedating medications. 30 tablet 4  . ibuprofen (MOTRIN) 600 MG tablet Take 600 mg by mouth every 6 (six) hours as needed    . lidocaine  (LIDODERM ) 5 % patch     . multivitamin tablet Take 1 tablet by mouth once daily    . naproxen  sodium (ALEVE ) 220 MG tablet Take by mouth    . turmeric, bulk, 100 % Powd Take 2 capsules by mouth once daily    . celecoxib (CELEBREX) 100 MG capsule  (Patient not taking: Reported on  07/29/2023)    . diclofenac (VOLTAREN) 75 MG EC tablet  (Patient not taking: Reported on 07/29/2023)    . glucosamine-D3-Boswellia serr 1,500-400-100 mg-unit-mg Tab Take by mouth (Patient not taking: Reported on 07/29/2023)    . HYDROcodone -acetaminophen  (NORCO) 5-325 mg tablet  (Patient not taking: Reported on 07/29/2023)    . HYDROmorphone  (DILAUDID ) 2 MG tablet every 8 (eight) hours (Patient not taking: Reported on 07/29/2023)    . losartan  (COZAAR ) 50 MG tablet Take 50 mg by mouth once daily (Patient not taking: Reported on 06/18/2023)    . miscellaneous medical supply Misc Breast compression garment (Patient not taking: Reported on 06/18/2023) 1 each 0  . traMADoL (ULTRAM) 50 mg tablet Take 1 tablet by mouth every 6 (six) hours (Patient not taking: Reported on 06/18/2023)    . valACYclovir (VALTREX) 1000 MG tablet  (Patient not taking: Reported on 07/29/2023)     No current facility-administered medications on file prior to visit.    Allergies: Patient has no known allergies.  Social and Family History  Social History  reports that she quit smoking about 35 years ago. Her smoking use included cigarettes. She has never used smokeless tobacco.  Family History Family History  Problem Relation Name Age of Onset  . Myocardial Infarction (Heart attack) Mother      Review of Systems   Review of Systems  Cardiovascular:  Positive for chest pain. Negative for palpitations, orthopnea, claudication, leg swelling and PND.      Physical Examination  Vitals:BP 130/70 (BP Location: Left upper arm, Patient Position: Sitting, BP Cuff Size: Adult)   Pulse 64   Resp 15   Ht 170.2 cm (5' 7)   Wt 80.3 kg (177 lb)   SpO2 98%   BMI 27.72 kg/m  Ht:170.2 cm (5' 7) Wt:80.3 kg (177 lb) ADJ:Anib surface area is 1.95 meters squared. Body mass index is 27.72 kg/m.  Physical Exam Vitals reviewed.  HENT:     Head: Normocephalic and atraumatic.  Cardiovascular:     Rate and Rhythm: Normal  rate and regular rhythm.     Pulses: Normal pulses.     Heart sounds: Normal heart sounds. No murmur heard. Pulmonary:     Effort: Pulmonary effort is normal.     Breath sounds: Normal breath sounds.  Abdominal:     General: Bowel sounds are normal.  Musculoskeletal:     Right lower leg: No edema.     Left lower leg: No edema.  Skin:    General: Skin is warm and dry.  Neurological:     Mental Status: She is alert.       Data & Results   No results for input(s): CHOLTOTAL, HDL, LDLCALC, LDLDIRECT, LDL, VLDL, TRIG in the last 73719 hours.  Recent Labs    04/30/22 1409  NA 142  K 4.1  BUN 14  CREATININE 0.8  CO2 27.9  GLUCOSE 78  ALT 16  AST 18  TBILI 0.6  ALB 4.6    Recent Labs    04/30/22 1409  WBC 8.4  HGB 13.6  HCT 43.4  MCV 81.4  PLT 231    Recent Labs    04/30/22 1409  TSH 2.588  HGBA1C 6.0*        Assessment   72 y.o. female with  Encounter Diagnoses  Name Primary?  . Mixed hyperlipidemia Yes  . HTN, goal below 130/80   . Atypical chest pain      07/04/2023 STRESS: NORMAL STRESS ECHOCARDIOGRAM. NORMAL RESTING STUDY WITH NO WALL MOTION ABNORMALITIES AT REST AND PEAK EXERCISE.  Maximum workload of 3.4 METs was achieved during exercise.       RESTING COMMENTS ---------------------------------------------------------------------   NORMAL LA PRESSURES WITH DIASTOLIC DYSFUNCTION (GRADE 1) VALVULAR REGURGITATION: MILD AR, TRIVIAL MR, TRIVIAL PR, MILD TR NO VALVULAR STENOSIS    Plan   No orders of the defined types were placed in this encounter.  Continue current cardiac medications. Encourage low-sodium diet, less than 2000 mg daily. Stress test was negative for ischemia. LVEF is preserved, no significant VHD. All questions answered regarding cardiac testing. I will send atorvastatin to her pharmacy. Patient has pretty poor exercise tolerance, I encourage her to walk more. Blood pressure is very well  controlled. Return in about 6 months (around 01/26/2024).   Attestation Statement:   I personally performed the service, non-incident to. (WP)   SABINA CUSTOVIC, DO    Sabina Custovic, DO

## 2023-07-30 ENCOUNTER — Ambulatory Visit
Admission: RE | Admit: 2023-07-30 | Discharge: 2023-07-30 | Disposition: A | Discharge status: Home / Self Care | Payer: Medicare HMO | Source: Ambulatory Visit | Attending: Radiation Oncology | Admitting: Radiation Oncology

## 2023-07-30 ENCOUNTER — Other Ambulatory Visit: Payer: Self-pay

## 2023-07-30 DIAGNOSIS — Z510 Encounter for antineoplastic radiation therapy: Secondary | ICD-10-CM | POA: Diagnosis not present

## 2023-07-30 LAB — RAD ONC ARIA SESSION SUMMARY
Course Elapsed Days: 20
Plan Fractions Treated to Date: 15
Plan Prescribed Dose Per Fraction: 2.66 Gy
Plan Total Fractions Prescribed: 16
Plan Total Prescribed Dose: 42.56 Gy
Reference Point Dosage Given to Date: 39.9 Gy
Reference Point Session Dosage Given: 2.66 Gy
Session Number: 15

## 2023-07-31 ENCOUNTER — Ambulatory Visit
Admission: RE | Admit: 2023-07-31 | Discharge: 2023-07-31 | Disposition: A | Discharge status: Home / Self Care | Payer: Medicare HMO | Source: Ambulatory Visit | Attending: Radiation Oncology | Admitting: Radiation Oncology

## 2023-07-31 ENCOUNTER — Inpatient Hospital Stay: Payer: Medicare HMO

## 2023-07-31 ENCOUNTER — Other Ambulatory Visit: Payer: Self-pay

## 2023-07-31 ENCOUNTER — Ambulatory Visit: Admission: RE | Admit: 2023-07-31 | Payer: Medicare HMO | Source: Ambulatory Visit

## 2023-07-31 DIAGNOSIS — Z510 Encounter for antineoplastic radiation therapy: Secondary | ICD-10-CM | POA: Diagnosis not present

## 2023-07-31 LAB — RAD ONC ARIA SESSION SUMMARY
Course Elapsed Days: 21
Plan Fractions Treated to Date: 16
Plan Prescribed Dose Per Fraction: 2.66 Gy
Plan Total Fractions Prescribed: 16
Plan Total Prescribed Dose: 42.56 Gy
Reference Point Dosage Given to Date: 42.56 Gy
Reference Point Session Dosage Given: 2.66 Gy
Session Number: 16

## 2023-08-05 ENCOUNTER — Other Ambulatory Visit: Payer: Self-pay

## 2023-08-05 ENCOUNTER — Ambulatory Visit
Admission: RE | Admit: 2023-08-05 | Discharge: 2023-08-05 | Disposition: A | Discharge status: Home / Self Care | Payer: Medicare HMO | Source: Ambulatory Visit | Attending: Radiation Oncology | Admitting: Radiation Oncology

## 2023-08-05 DIAGNOSIS — Z510 Encounter for antineoplastic radiation therapy: Secondary | ICD-10-CM | POA: Insufficient documentation

## 2023-08-05 DIAGNOSIS — D0511 Intraductal carcinoma in situ of right breast: Secondary | ICD-10-CM | POA: Diagnosis present

## 2023-08-05 LAB — RAD ONC ARIA SESSION SUMMARY
Course Elapsed Days: 26
Plan Fractions Treated to Date: 1
Plan Prescribed Dose Per Fraction: 2 Gy
Plan Total Fractions Prescribed: 5
Plan Total Prescribed Dose: 10 Gy
Reference Point Dosage Given to Date: 2 Gy
Reference Point Session Dosage Given: 2 Gy
Session Number: 17

## 2023-08-06 ENCOUNTER — Other Ambulatory Visit: Payer: Self-pay

## 2023-08-06 ENCOUNTER — Ambulatory Visit
Admission: RE | Admit: 2023-08-06 | Discharge: 2023-08-06 | Disposition: A | Discharge status: Home / Self Care | Payer: Medicare HMO | Source: Ambulatory Visit | Attending: Radiation Oncology | Admitting: Radiation Oncology

## 2023-08-06 DIAGNOSIS — Z510 Encounter for antineoplastic radiation therapy: Secondary | ICD-10-CM | POA: Diagnosis not present

## 2023-08-06 LAB — RAD ONC ARIA SESSION SUMMARY
Course Elapsed Days: 27
Plan Fractions Treated to Date: 2
Plan Prescribed Dose Per Fraction: 2 Gy
Plan Total Fractions Prescribed: 5
Plan Total Prescribed Dose: 10 Gy
Reference Point Dosage Given to Date: 4 Gy
Reference Point Session Dosage Given: 2 Gy
Session Number: 18

## 2023-08-07 ENCOUNTER — Ambulatory Visit
Admission: RE | Admit: 2023-08-07 | Discharge: 2023-08-07 | Disposition: A | Discharge status: Home / Self Care | Payer: Medicare HMO | Source: Ambulatory Visit | Attending: Radiation Oncology | Admitting: Radiation Oncology

## 2023-08-07 ENCOUNTER — Other Ambulatory Visit: Payer: Self-pay

## 2023-08-07 DIAGNOSIS — Z510 Encounter for antineoplastic radiation therapy: Secondary | ICD-10-CM | POA: Diagnosis not present

## 2023-08-07 LAB — RAD ONC ARIA SESSION SUMMARY
Course Elapsed Days: 28
Plan Fractions Treated to Date: 3
Plan Prescribed Dose Per Fraction: 2 Gy
Plan Total Fractions Prescribed: 5
Plan Total Prescribed Dose: 10 Gy
Reference Point Dosage Given to Date: 6 Gy
Reference Point Session Dosage Given: 2 Gy
Session Number: 19

## 2023-08-08 ENCOUNTER — Other Ambulatory Visit: Payer: Self-pay

## 2023-08-08 ENCOUNTER — Ambulatory Visit
Admission: RE | Admit: 2023-08-08 | Discharge: 2023-08-08 | Disposition: A | Discharge status: Home / Self Care | Payer: Medicare HMO | Source: Ambulatory Visit | Attending: Radiation Oncology | Admitting: Radiation Oncology

## 2023-08-08 DIAGNOSIS — Z510 Encounter for antineoplastic radiation therapy: Secondary | ICD-10-CM | POA: Diagnosis not present

## 2023-08-08 LAB — RAD ONC ARIA SESSION SUMMARY
Course Elapsed Days: 29
Plan Fractions Treated to Date: 4
Plan Prescribed Dose Per Fraction: 2 Gy
Plan Total Fractions Prescribed: 5
Plan Total Prescribed Dose: 10 Gy
Reference Point Dosage Given to Date: 8 Gy
Reference Point Session Dosage Given: 2 Gy
Session Number: 20

## 2023-08-09 ENCOUNTER — Ambulatory Visit
Admission: RE | Admit: 2023-08-09 | Discharge: 2023-08-09 | Disposition: A | Discharge status: Home / Self Care | Payer: Medicare HMO | Source: Ambulatory Visit | Attending: Radiation Oncology | Admitting: Radiation Oncology

## 2023-08-09 ENCOUNTER — Encounter: Payer: Self-pay | Admitting: *Deleted

## 2023-08-09 ENCOUNTER — Other Ambulatory Visit: Payer: Self-pay

## 2023-08-09 DIAGNOSIS — Z510 Encounter for antineoplastic radiation therapy: Secondary | ICD-10-CM | POA: Diagnosis not present

## 2023-08-09 LAB — RAD ONC ARIA SESSION SUMMARY
Course Elapsed Days: 30
Plan Fractions Treated to Date: 5
Plan Prescribed Dose Per Fraction: 2 Gy
Plan Total Fractions Prescribed: 5
Plan Total Prescribed Dose: 10 Gy
Reference Point Dosage Given to Date: 10 Gy
Reference Point Session Dosage Given: 2 Gy
Session Number: 21

## 2023-08-12 NOTE — Radiation Completion Notes (Signed)
Patient Name: Kim Ruiz, RACE MRN: [id number] Date of Birth: Apr 02, 1951 Referring Physician: Lorenda Cahill, M.D. Date of Service: 2023-08-12 Radiation Oncologist: Carmina Miller, M.D. 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  There were area of 6 mm of pleomorphic and linear calcifications for which biopsy was recommended.  Core biopsy was positive for ductal carcinoma in situ.  She underwent a wide local excision and sentinel node biopsy showing high grade 3 DCIS at least 1.2 cm with expansive comedonecrosis present.  All margins were clear of DCIS by at least 2 mm.  4 sentinel nodes were examined all negative for metastatic disease.  Tumor was ER positive.  Patient is tolerating her treatments well she states her breast is still somewhat sensitive.  She is now referred to radiation oncology for consideration of treatment.      ==========DELIVERED PLANS==========  First Treatment Date: 2023-07-10 Last Treatment Date: 2023-08-09   Plan Name: Breast_R Site: Breast, Right Technique: 3D Mode: Photon Dose Per Fraction: 2.66 Gy Prescribed Dose (Delivered / Prescribed): 42.56 Gy / 42.56 Gy Prescribed Fxs (Delivered / Prescribed): 16 / 16   Plan Name: Breast_R_Bst Site: Breast, Right Technique: 3D Mode: Photon Dose Per Fraction: 2 Gy Prescribed Dose (Delivered / Prescribed): 10 Gy / 10 Gy Prescribed Fxs (Delivered / Prescribed): 5 / 5     ==========ON TREATMENT VISIT DATES========== 2023-07-16, 2023-07-23, 2023-07-30, 2023-08-06     ==========UPCOMING VISITS========== 09/26/2023 [facility]- NEW  PATIENT Debbe Odea, MD  09/12/2023 [facility] RAD ONCOLOGY FOLLOW UP 30 Carmina Miller, MD  09/05/2023 [facility]  [facility] MM [facility]-DEXA  08/22/2023 [facility] MED ONC EST PT 15 Rickard Patience, MD        ==========APPENDIX - ON TREATMENT VISIT NOTES==========   See weekly On Treatment Notes in Epic for details in the Media tab (listed as Progress notes on the On Treatment Visit Dates listed above).

## 2023-08-13 ENCOUNTER — Emergency Department: Payer: Medicare HMO

## 2023-08-13 ENCOUNTER — Emergency Department
Admission: EM | Admit: 2023-08-13 | Discharge: 2023-08-13 | Discharge status: Against Medical Advice | Payer: Medicare HMO | Attending: Student | Admitting: Student

## 2023-08-13 ENCOUNTER — Other Ambulatory Visit: Payer: Self-pay

## 2023-08-13 DIAGNOSIS — Z5321 Procedure and treatment not carried out due to patient leaving prior to being seen by health care provider: Secondary | ICD-10-CM | POA: Insufficient documentation

## 2023-08-13 DIAGNOSIS — R2231 Localized swelling, mass and lump, right upper limb: Secondary | ICD-10-CM | POA: Insufficient documentation

## 2023-08-13 LAB — CBC WITH DIFFERENTIAL/PLATELET
Abs Immature Granulocytes: 0.03 10*3/uL (ref 0.00–0.07)
Basophils Absolute: 0.1 10*3/uL (ref 0.0–0.1)
Basophils Relative: 1 %
Eosinophils Absolute: 0.1 10*3/uL (ref 0.0–0.5)
Eosinophils Relative: 1 %
HCT: 42.7 % (ref 36.0–46.0)
Hemoglobin: 13.4 g/dL (ref 12.0–15.0)
Immature Granulocytes: 1 %
Lymphocytes Relative: 29 %
Lymphs Abs: 1.9 10*3/uL (ref 0.7–4.0)
MCH: 25.3 pg — ABNORMAL LOW (ref 26.0–34.0)
MCHC: 31.4 g/dL (ref 30.0–36.0)
MCV: 80.6 fL (ref 80.0–100.0)
Monocytes Absolute: 0.5 10*3/uL (ref 0.1–1.0)
Monocytes Relative: 8 %
Neutro Abs: 4 10*3/uL (ref 1.7–7.7)
Neutrophils Relative %: 60 %
Platelets: 241 10*3/uL (ref 150–400)
RBC: 5.3 MIL/uL — ABNORMAL HIGH (ref 3.87–5.11)
RDW: 14 % (ref 11.5–15.5)
WBC: 6.7 10*3/uL (ref 4.0–10.5)
nRBC: 0 % (ref 0.0–0.2)

## 2023-08-13 LAB — BASIC METABOLIC PANEL
Anion gap: 9 (ref 5–15)
BUN: 12 mg/dL (ref 8–23)
CO2: 24 mmol/L (ref 22–32)
Calcium: 10.1 mg/dL (ref 8.9–10.3)
Chloride: 108 mmol/L (ref 98–111)
Creatinine, Ser: 0.92 mg/dL (ref 0.44–1.00)
GFR, Estimated: >60 mL/min (ref >60)
Glucose, Bld: 96 mg/dL (ref 70–99)
Potassium: 3.1 mmol/L — ABNORMAL LOW (ref 3.5–5.1)
Sodium: 141 mmol/L (ref 135–145)

## 2023-08-13 NOTE — ED Triage Notes (Signed)
Pt here with right swelling since Monday. Pt just finished radiation tx on Friday. Pt states her joints hurt when she closed her hand. Pt has a hx of breast cancer.

## 2023-08-13 NOTE — ED Provider Triage Note (Cosign Needed)
Emergency Medicine Provider Triage Evaluation Note  Kim Ruiz , a 72 y.o. female  was evaluated in triage.  Pt complains of right arm swelling. Patient just completed radiation for breast cancer on Friday.  Review of Systems  Positive: Right arm swelling, joint pain Negative: pain  Physical Exam  There were no vitals taken for this visit. Gen:   Awake, no distress   Resp:  Normal effort  MSK:   Moves extremities without difficulty  Other:    Medical Decision Making  Medically screening exam initiated at 12:52 PM.  Appropriate orders placed.  Shantise Beauchesne was informed that the remainder of the evaluation will be completed by another provider, this initial triage assessment does not replace that evaluation, and the importance of remaining in the ED until their evaluation is complete.    Cameron Ali, PA-C 08/13/23 1253

## 2023-08-13 NOTE — ED Notes (Signed)
Pt rang bell and had to leave--she was unwilling to wait for the doctor to talk to them about results/further imaging. She stated that she had to go and pick up her granddaughter and left AMA

## 2023-08-14 ENCOUNTER — Other Ambulatory Visit: Payer: Self-pay | Admitting: Family Medicine

## 2023-08-14 DIAGNOSIS — M7989 Other specified soft tissue disorders: Secondary | ICD-10-CM

## 2023-08-22 ENCOUNTER — Encounter: Payer: Self-pay | Admitting: Oncology

## 2023-08-22 ENCOUNTER — Encounter: Payer: Self-pay | Admitting: *Deleted

## 2023-08-22 ENCOUNTER — Inpatient Hospital Stay: Payer: Medicare HMO | Attending: Oncology | Admitting: Oncology

## 2023-08-22 VITALS — BP 164/73 | HR 65 | Temp 97.5°F | Resp 18

## 2023-08-22 DIAGNOSIS — Z87891 Personal history of nicotine dependence: Secondary | ICD-10-CM | POA: Diagnosis not present

## 2023-08-22 DIAGNOSIS — D0511 Intraductal carcinoma in situ of right breast: Secondary | ICD-10-CM | POA: Diagnosis not present

## 2023-08-22 DIAGNOSIS — Z807 Family history of other malignant neoplasms of lymphoid, hematopoietic and related tissues: Secondary | ICD-10-CM | POA: Insufficient documentation

## 2023-08-22 DIAGNOSIS — I82611 Acute embolism and thrombosis of superficial veins of right upper extremity: Secondary | ICD-10-CM

## 2023-08-22 DIAGNOSIS — Z7901 Long term (current) use of anticoagulants: Secondary | ICD-10-CM | POA: Diagnosis not present

## 2023-08-22 DIAGNOSIS — Z79811 Long term (current) use of aromatase inhibitors: Secondary | ICD-10-CM | POA: Diagnosis not present

## 2023-08-22 DIAGNOSIS — Z803 Family history of malignant neoplasm of breast: Secondary | ICD-10-CM

## 2023-08-22 DIAGNOSIS — Z79899 Other long term (current) drug therapy: Secondary | ICD-10-CM | POA: Insufficient documentation

## 2023-08-22 MED ORDER — APIXABAN 2.5 MG PO TABS: 2.5000 mg | ORAL_TABLET | Freq: Two times a day (BID) | ORAL | 0 refills | Status: DC

## 2023-08-22 MED ORDER — ANASTROZOLE 1 MG PO TABS: 1.0000 mg | ORAL_TABLET | Freq: Every day | ORAL | 3 refills | Status: DC

## 2023-08-22 NOTE — Assessment & Plan Note (Signed)
Referred to genetic counseling. She had a discussion and declined testing.

## 2023-08-22 NOTE — Progress Notes (Addendum)
Hematology/Oncology Progress note [phone] [phone]        REFERRING PROVIDER: Lorn Junes, FNP    CHIEF COMPLAINTS/PURPOSE OF CONSULTATION:  Right breast high grade DCIS  ASSESSMENT & PLAN:   Cancer Staging  Ductal carcinoma in situ (DCIS) of right breast Staging form: Breast, AJCC 8th Edition - Clinical stage from 05/24/2023: Stage 0 (cTis (DCIS), cN0, cM0) - Signed by Rickard Patience, MD on 05/24/2023   Ductal carcinoma in situ (DCIS) of right breast Right breast DCIS s/p lumpectomy and SLNB. ER 95% positive, status postlumpectomy and adjuvant radiation. She is postmenopausal. I recommend adjuvant endocrine therapy with aromatase inhibitor.  Recommend Arimidex 1 mg daily. Rationale and side effects were reviewed with patient.  Patient agrees.  Prescription sent to pharmacy. Obtain baseline DEXA.  Recommend calcium and vitamin D supplementation.   Family history of breast cancer Referred to genetic counseling. She had a discussion and declined testing.   Superficial venous thrombosis of arm, right Ultrasound results reviewed with patient.  She is symptomatic. I recommend Eliquis 2.5 mg twice daily for 4 weeks.  Advised patient to call office if symptom does not improve. Rationale and side effects were reviewed with patient.     Orders Placed This Encounter  Procedures   CBC with Differential (Cancer Center Only)    Standing Status:   Future    Expected Date:   11/20/2023    Expiration Date:   08/21/2024   CMP (Cancer Center only)    Standing Status:   Future    Expected Date:   11/20/2023    Expiration Date:   08/21/2024  r Follow up 3 months All questions were answered. The patient knows to call the clinic with any problems, questions or concerns.  Rickard Patience, MD, PhD [facility] Hematology Oncology 08/22/2023    HISTORY OF PRESENTING ILLNESS:  Kim Ruiz 72 y.o. female presents to establish care for right breast DCIS I have  reviewed her chart and materials related to her cancer extensively and collaborated history with the patient. Summary of oncologic history is as follows: Oncology History  Ductal carcinoma in situ (DCIS) of right breast  04/30/2023 Imaging   Bilateral screening mammogram showed  In the right breast, calcifications warrant further evaluation with magnified views. In the left breast, no findings suspicious for malignancy.     05/10/2023 Mammogram   Right diagnostic mammogram showed Magnified views are performed of calcifications in the UPPER-OUTER QUADRANT of the RIGHT breast. These views demonstrate a 6 millimeter group of pleomorphic and linear calcifications.   05/24/2023 Initial Diagnosis   Ductal carcinoma in situ (DCIS) of right breast  05/20/2023  1. Breast, right, needle core biopsy, upper outer posterior depth, ribbon clip:  DCIS, high grade, comedo -type  Necrosis present, calcification present, fibrocystic changes, adenosis.    Menarche at age of 42 No children OCP use: No History of hysterectomy: yes  Menopausal status: postmenopausal History of HRT use: No History of chest radiation: No  Number of previous breast biopsies:  No   05/24/2023 Cancer Staging   Staging form: Breast, AJCC 8th Edition - Clinical stage from 05/24/2023: Stage 0 (cTis (DCIS), cN0, cM0) - Signed by Rickard Patience, MD on 05/24/2023 Stage prefix: Initial diagnosis Nuclear grade: G3   05/31/2023 Surgery   Patient underwent lumpectomy and also got SLNB due to the upper outer quadrant location and being high-grade with comedonecrosis   1. Breast, lumpectomy, Right mass :      - RESIDUAL  DUCTAL CARCINOMA IN SITU (DCIS).      - SEE CANCER SUMMARY BELOW.      - BIOPSY SITE CHANGE WITH ASSOCIATED CLIP.      - SCOUT TAG PRESENT.       2. Breast, excision, Anterior margin :      - BENIGN BREAST TISSUE; NEGATIVE FOR MALIGNANCY.       3. Breast, excision, Superior margin :      - BENIGN BREAST TISSUE; NEGATIVE  FOR MALIGNANCY.       4. Lymph node, sentinel, biopsy, Right axillary #1 :      - ONE LYMPH NODE NEGATIVE FOR METASTATIC CARCINOMA (0/1).      - SEE NOTE.       5. Lymph node, sentinel, biopsy, right axillary #2 :      - ONE LYMPH NODE NEGATIVE FOR METASTATIC CARCINOMA (0/1).      - SEE NOTE.       6. Lymph node, sentinel, biopsy, right axillary #3 :      - ONE LYMPH NODE NEGATIVE FOR METASTATIC CARCINOMA (0/1).      - SEE NOTE.       7. Lymph node, sentinel, biopsy, right axillary #4 :      - ONE LYMPH NODE NEGATIVE FOR METASTATIC CARCINOMA (0/1).      - SEE NOTE.   MARGINS  Margin Status: All margins negative for DCIS by a distance of at least 2 mm   REGIONAL LYMPH NODES  Regional Lymph node Status: All regional lymph nodes negative for tumor  Total Number of Lymph Nodes Examined (sentinel and non-sentinel): 4  Number of Sentinel Nodes Examined: 4   ER 95% positive   07/10/2023 - 08/09/2023 Radiation Therapy   Adjuvant right breast radiation    Patient finished adjuvant right breast radiation on 08/09/2023. A few days after she finishes radiation, she has noticed right upper extremity swelling and pain. 08/13/2023, right upper extremity venous ultrasound was obtained which showed no DVT.  Questionable superficial venous thrombosis of a branch of the cephalic vein in the medial upper arm. Patient reports today that her symptom persists.    MEDICAL HISTORY:  Past Medical History:  Diagnosis Date   Arthritis    Dysrhythmia    Heart murmur    Hypertension     SURGICAL HISTORY: Past Surgical History:  Procedure Laterality Date   ABDOMINAL HYSTERECTOMY     BREAST BIOPSY Right 03/2013   BREAST BIOPSY Right 05/20/2023   MM RT BREAST BX W LOC DEV 1ST LESION IMAGE BX SPEC STEREO GUIDE 05/20/2023 [facility]   BREAST BIOPSY Right 05/30/2023   MM RT RADIO FREQUENCY TAG LOC MAMMO GUIDE 05/30/2023 [facility]   BREAST LUMPECTOMY,RADIO FREQ LOCALIZER,AXILLARY SENTINEL  LYMPH NODE BIOPSY Right 05/30/2023   savi placement   CARPAL TUNNEL RELEASE Right    COLONOSCOPY     COLONOSCOPY WITH PROPOFOL N/A 03/01/2015   Procedure: COLONOSCOPY WITH PROPOFOL;  Surgeon: Midge Minium, MD;  Location: [facility];  Service: Endoscopy;  Laterality: N/A;   KNEE ARTHROSCOPY Right    KNEE ARTHROSCOPY WITH MEDIAL MENISECTOMY Right 02/01/2015   Procedure: KNEE ARTHROSCOPY WITH MEDIAL MENISECTOMY;  Surgeon: Myra Rude, MD;  Location: [facility];  Service: Orthopedics;  Laterality: Right;   PART MASTECTOMY,RADIO FREQUENCY LOCALIZER,AXILLARY SENTINEL NODE BIOPSY Right 05/31/2023   Procedure: PARTIAL MASTECTOMY,RADIO FREQUENCY LOCALIZER,AXILLARY SENTINEL NODE BIOPSY;  Surgeon: Carolan Shiver, MD;  Location: [facility];  Service: General;  Laterality: Right;    SOCIAL HISTORY: Social  History   Socioeconomic History   Marital status: Single    Spouse name: Not on file   Number of children: Not on file   Years of education: Not on file   Highest education level: Not on file  Occupational History   Not on file  Tobacco Use   Smoking status: Former    Current packs/day: 0.00    Average packs/day: 0.3 packs/day for 13.0 years (3.3 ttl pk-yrs)    Types: Cigarettes    Start date: 19    Quit date: 41    Years since quitting: 36.9   Smokeless tobacco: Not on file  Vaping Use   Vaping status: Never Used  Substance and Sexual Activity   Alcohol use: No   Drug use: No   Sexual activity: Yes    Birth control/protection: Post-menopausal  Other Topics Concern   Not on file  Social History Narrative   ** Merged History Encounter **  lives with granddaughter   Social Drivers of Health   Financial Resource Strain: Not on file  Food Insecurity: No Food Insecurity (05/24/2023)   Hunger Vital Sign    Worried About Running Out of Food in the Last Year: Never true    Ran Out of Food in the Last Year: Never true  Transportation Needs: No Transportation Needs  (05/24/2023)   PRAPARE - Administrator, Civil Service (Medical): No    Lack of Transportation (Non-Medical): No  Physical Activity: Not on file  Stress: Not on file  Social Connections: Not on file  Intimate Partner Violence: Not At Risk (05/24/2023)   Humiliation, Afraid, Rape, and Kick questionnaire    Fear of Current or Ex-Partner: No    Emotionally Abused: No    Physically Abused: No    Sexually Abused: No    FAMILY HISTORY: Family History  Problem Relation Age of Onset   Heart attack Mother    Breast cancer Sister 40   Multiple myeloma Brother    Breast cancer Other        40's    ALLERGIES:  has no known allergies.  MEDICATIONS:  Current Outpatient Medications  Medication Sig Dispense Refill   amLODipine (NORVASC) 10 MG tablet Take 10 mg by mouth daily.     anastrozole (ARIMIDEX) 1 MG tablet Take 1 tablet (1 mg total) by mouth daily. 30 tablet 3   atorvastatin (LIPITOR) 20 MG tablet Take 20 mg by mouth daily.     acyclovir (ZOVIRAX) 400 MG tablet Take 400 mg by mouth 2 (two) times daily. (Patient not taking: Reported on 05/24/2023)     apixaban (ELIQUIS) 2.5 MG TABS tablet Take 1 tablet (2.5 mg total) by mouth 2 (two) times daily. 60 tablet 0   gabapentin (NEURONTIN) 100 MG capsule Take 100 mg by mouth as needed. (Patient not taking: Reported on 06/25/2023)     hydrochlorothiazide (HYDRODIURIL) 25 MG tablet Take 25 mg by mouth daily. (Patient not taking: Reported on 08/22/2023)     No current facility-administered medications for this visit.   Facility-Administered Medications Ordered in Other Visits  Medication Dose Route Frequency Provider Last Rate Last Admin   dexamethasone (DECADRON) injection   Intravenous Anesthesia Intra-op Emeterio Reeve, CRNA   10 mg at 05/31/23 1238   dexmedetomidine (PRECEDEX) 80 MCG/20ML   Intravenous Anesthesia Intra-op Emeterio Reeve, CRNA   4 mcg at 05/31/23 1219   fentaNYL (SUBLIMAZE) injection   Intravenous  Anesthesia Intra-op Emeterio Reeve, CRNA   50 mcg at  05/31/23 1321   lidocaine (cardiac) 100 mg/82mL (XYLOCAINE) injection 2%   Intravenous Anesthesia Intra-op Emeterio Reeve, CRNA   80 mg at 05/31/23 1135   midazolam (VERSED) injection   Intravenous Anesthesia Intra-op Emeterio Reeve, CRNA   1 mg at 05/31/23 1129   ondansetron (ZOFRAN) injection   Intravenous Anesthesia Intra-op Emeterio Reeve, CRNA   4 mg at 05/31/23 1238   propofol (DIPRIVAN) 10 mg/mL bolus/IV push   Intravenous Anesthesia Intra-op Emeterio Reeve, CRNA   150 mg at 05/31/23 1135   rocuronium (ZEMURON) injection   Intravenous Anesthesia Intra-op Emeterio Reeve, CRNA   50 mg at 05/31/23 1135   sugammadex sodium (BRIDION) injection   Intravenous Anesthesia Intra-op Emeterio Reeve, CRNA   161.4 mg at 05/31/23 1307    Review of Systems  Constitutional:  Negative for appetite change, chills, fatigue and fever.  HENT:   Negative for hearing loss and voice change.   Eyes:  Negative for eye problems.  Respiratory:  Negative for chest tightness and cough.   Cardiovascular:  Negative for chest pain.  Gastrointestinal:  Negative for abdominal distention, abdominal pain and blood in stool.  Endocrine: Negative for hot flashes.  Genitourinary:  Negative for difficulty urinating and frequency.   Musculoskeletal:  Negative for arthralgias.       Right upper extremity swelling and pain.  Skin:  Negative for itching and rash.  Neurological:  Negative for extremity weakness.  Hematological:  Negative for adenopathy.  Psychiatric/Behavioral:  Negative for confusion.      PHYSICAL EXAMINATION: ECOG PERFORMANCE STATUS: 0 - Asymptomatic  Vitals:   08/22/23 1049  BP: (!) 164/73  Pulse: 65  Resp: 18  Temp: (!) 97.5 F (36.4 C)   There were no vitals filed for this visit.   Physical Exam Constitutional:      General: She is not in acute distress.    Appearance: She is not diaphoretic.  HENT:     Head:  Normocephalic.  Eyes:     General: No scleral icterus. Cardiovascular:     Rate and Rhythm: Normal rate.  Pulmonary:     Effort: Pulmonary effort is normal. No respiratory distress.  Abdominal:     General: There is no distension.  Musculoskeletal:        General: Normal range of motion.     Cervical back: Normal range of motion and neck supple.     Comments: Right upper extremity edema 1+  Skin:    Findings: No rash.  Neurological:     Mental Status: She is alert and oriented to person, place, and time. Mental status is at baseline.     Cranial Nerves: No cranial nerve deficit.     Motor: No abnormal muscle tone.  Psychiatric:        Mood and Affect: Affect normal.      LABORATORY DATA:  I have reviewed the data as listed    Latest Ref Rng & Units 08/13/2023   12:55 PM 07/17/2023   12:56 PM 05/24/2023    1:18 PM  CBC  WBC 4.0 - 10.5 K/uL 6.7  7.1  7.3   Hemoglobin 12.0 - 15.0 g/dL 78.2  95.6  21.3   Hematocrit 36.0 - 46.0 % 42.7  43.3  43.9   Platelets 150 - 400 K/uL 241  232  238       Latest Ref Rng & Units 08/13/2023   12:55 PM 05/24/2023    1:18 PM  CMP  Glucose 70 - 99  mg/dL 96  295   BUN 8 - 23 mg/dL 12  11   Creatinine 2.84 - 1.00 mg/dL 1.32  4.40   Sodium 102 - 145 mmol/L 141  142   Potassium 3.5 - 5.1 mmol/L 3.1  3.4   Chloride 98 - 111 mmol/L 108  110   CO2 22 - 32 mmol/L 24  24   Calcium 8.9 - 10.3 mg/dL 72.5  36.6   Total Protein 6.5 - 8.1 g/dL  7.9   Total Bilirubin 0.3 - 1.2 mg/dL  0.7   Alkaline Phos 38 - 126 U/L  121   AST 15 - 41 U/L  26   ALT 0 - 44 U/L  29      RADIOGRAPHIC STUDIES: I have personally reviewed the radiological images as listed and agreed with the findings in the report. US Venous Img Upper Right (DVT Study) Result Date: 08/13/2023 CLINICAL DATA:  Right arm swelling Prior right partial mastectomy EXAM: RIGHT UPPER EXTREMITY VENOUS DOPPLER ULTRASOUND TECHNIQUE: Gray-scale sonography with graded compression, as well as color  Doppler and duplex ultrasound were performed to evaluate the upper extremity deep venous system from the level of the subclavian vein and including the jugular, axillary, basilic, radial, ulnar and upper cephalic vein. Spectral Doppler was utilized to evaluate flow at rest and with distal augmentation maneuvers. COMPARISON:  None available FINDINGS: Contralateral Subclavian Vein: Respiratory phasicity is normal and symmetric with the symptomatic side. No evidence of thrombus. Normal compressibility. Internal Jugular Vein: No evidence of thrombus. Normal compressibility, respiratory phasicity and response to augmentation. Subclavian Vein: No evidence of thrombus. Normal compressibility, respiratory phasicity and response to augmentation. Axillary Vein: No evidence of thrombus. Normal compressibility, respiratory phasicity and response to augmentation. Cephalic Vein: Questionable thrombosis of a branch of the cephalic vein in the mid upper arm. Basilic Vein: No evidence of thrombus. Normal compressibility, respiratory phasicity and response to augmentation. Brachial Veins: No evidence of thrombus. Normal compressibility, respiratory phasicity and response to augmentation. Radial Veins: No evidence of thrombus. Normal compressibility, respiratory phasicity and response to augmentation. Ulnar Veins: No evidence of thrombus. Normal compressibility, respiratory phasicity and response to augmentation. Venous Reflux:  None visualized. Other Findings:  None visualized. IMPRESSION: 1. No right upper extremity DVT. 2. Questionable superficial venous thrombosis of a branch of the cephalic vein in the mid upper arm. Electronically Signed   By: Acquanetta Belling M.D.   On: 08/13/2023 15:14

## 2023-08-22 NOTE — Assessment & Plan Note (Addendum)
Ultrasound results reviewed with patient.  She is symptomatic. I recommend Eliquis 2.5 mg twice daily for 4 weeks.  Advised patient to call office if symptom does not improve. Rationale and side effects were reviewed with patient.

## 2023-08-22 NOTE — Progress Notes (Signed)
Pt here for follow. Pt reports right swelling since after radiation. She had RUE [place] at urgent care but did not receive results.

## 2023-08-22 NOTE — Assessment & Plan Note (Addendum)
Right breast DCIS s/p lumpectomy and SLNB. ER 95% positive, status postlumpectomy and adjuvant radiation. She is postmenopausal. I recommend adjuvant endocrine therapy with aromatase inhibitor.  Recommend Arimidex 1 mg daily. Rationale and side effects were reviewed with patient.  Patient agrees.  Prescription sent to pharmacy. Obtain baseline DEXA.  Recommend calcium and vitamin D supplementation.

## 2023-08-26 ENCOUNTER — Emergency Department
Admission: EM | Admit: 2023-08-26 | Discharge: 2023-08-26 | Disposition: A | Discharge status: Home / Self Care | Payer: Medicare HMO | Attending: Emergency Medicine | Admitting: Emergency Medicine

## 2023-08-26 ENCOUNTER — Emergency Department: Payer: Medicare HMO

## 2023-08-26 ENCOUNTER — Other Ambulatory Visit: Payer: Self-pay | Admitting: General Surgery

## 2023-08-26 ENCOUNTER — Other Ambulatory Visit: Payer: Self-pay

## 2023-08-26 DIAGNOSIS — T888XXA Other specified complications of surgical and medical care, not elsewhere classified, initial encounter: Secondary | ICD-10-CM | POA: Insufficient documentation

## 2023-08-26 DIAGNOSIS — Z20822 Contact with and (suspected) exposure to covid-19: Secondary | ICD-10-CM | POA: Insufficient documentation

## 2023-08-26 DIAGNOSIS — N611 Abscess of the breast and nipple: Secondary | ICD-10-CM

## 2023-08-26 DIAGNOSIS — R600 Localized edema: Secondary | ICD-10-CM | POA: Diagnosis present

## 2023-08-26 DIAGNOSIS — Z7901 Long term (current) use of anticoagulants: Secondary | ICD-10-CM | POA: Insufficient documentation

## 2023-08-26 DIAGNOSIS — N6489 Other specified disorders of breast: Secondary | ICD-10-CM | POA: Insufficient documentation

## 2023-08-26 DIAGNOSIS — I2510 Atherosclerotic heart disease of native coronary artery without angina pectoris: Secondary | ICD-10-CM | POA: Diagnosis not present

## 2023-08-26 DIAGNOSIS — I719 Aortic aneurysm of unspecified site, without rupture: Secondary | ICD-10-CM | POA: Insufficient documentation

## 2023-08-26 DIAGNOSIS — R0789 Other chest pain: Secondary | ICD-10-CM | POA: Insufficient documentation

## 2023-08-26 DIAGNOSIS — Z86718 Personal history of other venous thrombosis and embolism: Secondary | ICD-10-CM | POA: Insufficient documentation

## 2023-08-26 DIAGNOSIS — I700 Atherosclerosis of aorta: Secondary | ICD-10-CM | POA: Diagnosis not present

## 2023-08-26 LAB — BASIC METABOLIC PANEL
Anion gap: 10 (ref 5–15)
BUN: 10 mg/dL (ref 8–23)
CO2: 22 mmol/L (ref 22–32)
Calcium: 9.8 mg/dL (ref 8.9–10.3)
Chloride: 106 mmol/L (ref 98–111)
Creatinine, Ser: 0.72 mg/dL (ref 0.44–1.00)
GFR, Estimated: >60 mL/min (ref >60)
Glucose, Bld: 100 mg/dL — ABNORMAL HIGH (ref 70–99)
Potassium: 3.4 mmol/L — ABNORMAL LOW (ref 3.5–5.1)
Sodium: 138 mmol/L (ref 135–145)

## 2023-08-26 LAB — CBC
HCT: 39.7 % (ref 36.0–46.0)
Hemoglobin: 12.6 g/dL (ref 12.0–15.0)
MCH: 25.3 pg — ABNORMAL LOW (ref 26.0–34.0)
MCHC: 31.7 g/dL (ref 30.0–36.0)
MCV: 79.6 fL — ABNORMAL LOW (ref 80.0–100.0)
Platelets: 221 10*3/uL (ref 150–400)
RBC: 4.99 MIL/uL (ref 3.87–5.11)
RDW: 13.7 % (ref 11.5–15.5)
WBC: 6.8 10*3/uL (ref 4.0–10.5)
nRBC: 0 % (ref 0.0–0.2)

## 2023-08-26 LAB — HEPATIC FUNCTION PANEL
ALT: 19 U/L (ref 0–44)
AST: 23 U/L (ref 15–41)
Albumin: 3.8 g/dL (ref 3.5–5.0)
Alkaline Phosphatase: 108 U/L (ref 38–126)
Bilirubin, Direct: <0.1 mg/dL (ref 0.0–0.2)
Total Bilirubin: 0.6 mg/dL (ref <1.2)
Total Protein: 6.8 g/dL (ref 6.5–8.1)

## 2023-08-26 LAB — RESP PANEL BY RT-PCR (RSV, FLU A&B, COVID)  RVPGX2
Influenza A by PCR: NEGATIVE
Influenza B by PCR: NEGATIVE
Resp Syncytial Virus by PCR: NEGATIVE
SARS Coronavirus 2 by RT PCR: NEGATIVE

## 2023-08-26 LAB — TROPONIN I (HIGH SENSITIVITY)
Troponin I (High Sensitivity): 5 ng/L (ref <18)
Troponin I (High Sensitivity): 4 ng/L (ref <18)

## 2023-08-26 MED ORDER — IOHEXOL 350 MG/ML SOLN
75.0000 mL | Freq: Once | INTRAVENOUS | Status: AC | PRN
  Administered 2023-08-26: 75 mL via INTRAVENOUS

## 2023-08-26 NOTE — ED Provider Notes (Signed)
[facility] Provider Note   Event Date/Time   First MD Initiated Contact with Patient 08/26/23 1025     (approximate)  History   rib cage pain  HPI  Kim Ruiz is a 72 y.o. female with a history of ductal carcinoma in situ right breast  Also diagnosed with venous thrombosis of the right arm on Eliquis   Patient has been on Eliquis for about 2 days taking it twice daily as prescribed.  She noticed several days ago swelling in the right upper arm and hand that is been steady and stable but was diagnosed with a thrombosis.  Yesterday afternoon she started develop a little bit of a feeling like a uncomfortableness below the right breast.  No rash or obvious swelling.  No fevers or chills.  She has had a very slight cough for a day.  Denies having chest pain but instead reports feels like a hard to describe little bit of a discomfort under the right breast area.  No abdominal pain no nausea or vomiting no fevers.  No swelling in her legs or left arm.  The swelling in the right arm has been steady there is no redness or pain.  No loss of sensation  Physical Exam   Triage Vital Signs: ED Triage Vitals  Encounter Vitals Group     BP 08/26/23 0955 (!) 158/78     Systolic BP Percentile --      Diastolic BP Percentile --      Pulse Rate 08/26/23 0955 (!) 56     Resp 08/26/23 0955 20     Temp 08/26/23 0954 97.9 F (36.6 C)     Temp src --      SpO2 08/26/23 0955 99 %     Weight 08/26/23 0956 182 lb 15.7 oz (83 kg)     Height 08/26/23 0956 5\' 7"  (1.702 m)     Head Circumference --      Peak Flow --      Pain Score 08/26/23 0955 3     Pain Loc --      Pain Education --      Exclude from Growth Chart --     Most recent vital signs: Vitals:   08/26/23 0954 08/26/23 0955  BP:  (!) 158/78  Pulse:  (!) 56  Resp:  20  Temp: 97.9 F (36.6 C)   SpO2:  99%     General: Awake, no distress. CV:  Good peripheral perfusion.  Normal tones and  rate Resp:  Normal effort.  Clear bilateral normal work of breathing Examined with tech, [place], as well as curtains.  The patient has normal appearance to visual inspection of the right breast, there is no palpable edema lesion or otherwise in or around the area of the right breast that would obviously explain her symptomatology. Abd:  No distention.  Soft nontender throughout, no right upper quadrant pain. Other:  Left upper and bilateral lower extremities without edema venous cords or congestion.  The right upper extremity has slight edema in the hand and fingers and slight edema overlying the forearm and upper arm.  There is no obvious venous cords there is no cellulitic changes no skin breakdown.  Warm and well-perfused   ED Results / Procedures / Treatments   Labs (all labs ordered are listed, but only abnormal results are displayed) Labs Reviewed  BASIC METABOLIC PANEL - Abnormal; Notable for the following components:      Result Value  Potassium 3.4 (*)    Glucose, Bld 100 (*)    All other components within normal limits  CBC - Abnormal; Notable for the following components:   MCV 79.6 (*)    MCH 25.3 (*)    All other components within normal limits  RESP PANEL BY RT-PCR (RSV, FLU A&B, COVID)  [id number]  HEPATIC FUNCTION PANEL  PROTIME-INR  TROPONIN I (HIGH SENSITIVITY)  TROPONIN I (HIGH SENSITIVITY)     EKG  And interpreted by me at 1005 heart rate 55 QRS 80 QTc 420 Normal sinus rhythm, slight artifact.  No evidence of acute ischemia.  Very mild nonspecific T wave flattening and artifact   RADIOLOGY  Chest x-ray inter by me is normal  US Venous Img Upper Uni Right(DVT) Result Date: 08/26/2023 CLINICAL DATA:  Several week history of right upper extremity swelling. Recent history of right arm thrombosis EXAM: RIGHT UPPER EXTREMITY VENOUS DOPPLER ULTRASOUND TECHNIQUE: Gray-scale sonography with graded compression, as well as color Doppler and duplex ultrasound were  performed to evaluate the upper extremity deep venous system from the level of the subclavian vein and including the jugular, axillary, basilic, radial, ulnar and upper cephalic vein. Spectral Doppler was utilized to evaluate flow at rest and with distal augmentation maneuvers. COMPARISON:  Extremity venous ultrasound dated 08/13/2023 FINDINGS: Contralateral Subclavian Vein: Respiratory phasicity is normal and symmetric with the symptomatic side. No evidence of thrombus. Normal compressibility. Internal Jugular Vein: No evidence of thrombus. Normal compressibility, respiratory phasicity and response to augmentation. Subclavian Vein: No evidence of thrombus. Normal compressibility, respiratory phasicity and response to augmentation. Axillary Vein: No evidence of thrombus. Normal compressibility, respiratory phasicity and response to augmentation. Cephalic Vein: Previously noted deep branch of the cephalic in the mid upper arm now demonstrates flow on color Doppler. Basilic Vein: No evidence of thrombus. Normal compressibility, respiratory phasicity and response to augmentation. Brachial Veins: No evidence of thrombus. Normal compressibility, respiratory phasicity and response to augmentation. Radial Veins: No evidence of thrombus. Normal compressibility, respiratory phasicity and response to augmentation. Ulnar Veins: No evidence of thrombus. Normal compressibility, respiratory phasicity and response to augmentation. Venous Reflux:  None visualized. Other Findings: Subcutaneous soft tissue edema involving the radial aspect of the forearm. IMPRESSION: 1. No evidence of DVT within the right upper extremity. 2. Previously noted deep branch of the cephalic vein in the mid upper arm now demonstrates flow on color Doppler. 3. Subcutaneous soft tissue edema involving the radial aspect of the forearm. Electronically Signed   By: Agustin Cree M.D.   On: 08/26/2023 14:01   CT Angio Chest PE W and/or Wo Contrast Result Date:  08/26/2023 CLINICAL DATA:  Recent history of right arm thrombosis on anticoagulation with new shortness of breath and right chest pain EXAM: CT ANGIOGRAPHY CHEST WITH CONTRAST TECHNIQUE: Multidetector CT imaging of the chest was performed using the standard protocol during bolus administration of intravenous contrast. Multiplanar CT image reconstructions and MIPs were obtained to evaluate the vascular anatomy. RADIATION DOSE REDUCTION: This exam was performed according to the departmental dose-optimization program which includes automated exposure control, adjustment of the mA and/or kV according to patient size and/or use of iterative reconstruction technique. CONTRAST:  75mL OMNIPAQUE IOHEXOL 350 MG/ML SOLN COMPARISON:  Same day chest radiograph FINDINGS: Cardiovascular: The study is high quality for the evaluation of pulmonary embolism. There are no filling defects in the central, lobar, segmental or subsegmental pulmonary artery branches to suggest acute pulmonary embolism. Ascending thoracic aorta measures 4.1 x 4.1 cm. Normal  heart size. No significant pericardial fluid/thickening. Coronary artery calcifications and aortic atherosclerosis. Mediastinum/Nodes: Imaged thyroid gland without nodules meeting criteria for imaging follow-up by size. Normal esophagus. 1.1 cm right hilar lymph node (5:58). Lungs/Pleura: The central airways are patent. Biapical pleural-parenchymal scarring. No focal consolidation. No pneumothorax. No pleural effusion. Upper abdomen: Normal. Musculoskeletal: No acute or abnormal lytic or blastic osseous lesions. Mildly rim enhancing 3.9 x 3.0 cm fluid collection within the upper outer right breast (5:72) associated with overlying cutaneous thickening of the right breast and adjacent surgical clips. Postsurgical changes of the right axilla. Review of the MIP images confirms the above findings. IMPRESSION: 1. No evidence of pulmonary embolism. 2. Mildly rim enhancing 3.9 x 3.0 cm fluid  collection within the upper outer right breast associated with overlying cutaneous thickening of the right breast and adjacent surgical clips, likely postsurgical seroma or posttreatment changes. 3. Enlarged right hilar lymph node, likely reactive. 4. Ascending thoracic aorta measures 4.1 cm. Recommend annual imaging followup by CTA or MRA. This recommendation follows 2010 ACCF/AHA/AATS/ACR/ASA/SCA/SCAI/SIR/STS/SVM Guidelines for the Diagnosis and Management of Patients with Thoracic Aortic Disease. Circulation. 2010; 121: Z610-R604. Aortic aneurysm NOS (ICD10-I71.9) 5. Aortic Atherosclerosis (ICD10-I70.0). Coronary artery calcifications. Assessment for potential risk factor modification, dietary therapy or pharmacologic therapy may be warranted, if clinically indicated. Electronically Signed   By: Agustin Cree M.D.   On: 08/26/2023 12:37   DG Chest 2 View Result Date: 08/26/2023 CLINICAL DATA:  Chest pain.  History of breast cancer. EXAM: CHEST - 2 VIEW COMPARISON:  December 30, 2021. FINDINGS: The heart size and mediastinal contours are within normal limits. Both lungs are clear. The visualized skeletal structures are unremarkable. IMPRESSION: No active cardiopulmonary disease. Electronically Signed   By: Lupita Raider M.D.   On: 08/26/2023 10:45      PROCEDURES:  Critical Care performed: No  Procedures   MEDICATIONS ORDERED IN ED: Medications  iohexol (OMNIPAQUE) 350 MG/ML injection 75 mL (75 mLs Intravenous Contrast Given 08/26/23 1156)     IMPRESSION / MDM / ASSESSMENT AND PLAN / ED COURSE  I reviewed the triage vital signs and the nursing notes.                              Differential diagnosis includes, but is not limited to, extension of DVT, PE, thrombosis, infection pleural effusion, mass lesion etc.  She does not have any symptoms of be highly suggestive of ACS.  ECG mild nonspecific flattening but no central chest pressure, she relates a slight uncomfortable feeling below the  right breast.  There is no obvious breast lesion but she is also had radiation therapy there as well these could be symptoms of neuralgia, scar tissue formation etc.  Will obtain CT PE study right upper arm ultrasound to assess for extension of DVT or PE.  Patient resting comfortably at this time does not wish for any pain medication or otherwise.  No obvious infectious symptoms.  Initial troponin normal CBC reassuring with slightly low MCV  Patient's presentation is most consistent with acute complicated illness / injury requiring diagnostic workup.      Clinical Course as of 08/26/23 1429  Mon Aug 26, 2023  1319 Resting comfortably no distress.  Awaiting imaging results [MQ]  1322 Mildly rim enhancing 3.9 x 3.0 cm fluid collection within the  upper outer right breast associated with overlying cutaneous  thickening of the right breast and adjacent surgical clips, likely  postsurgical seroma or posttreatment changes.    Of this area noted on the patient's CT imaging correlates quite well with the area of her concern.  There is no underlying PE and this finding seems highly suggestive that her symptoms are from a developing fluid collection possibly a seroma or other located in the area of the right breast.  I have placed consult with general surgery for further recommendations [MQ]  1348 See by Dr. Belinda Fisher in ED. after seeing the patient, he advises he does not feel there is an abscess but did recommend outpatient aspiration which his office will help coordinate via the [facility] likely on Monday.  He advises from surgical standpoint nothing to do other than outpatient follow-up and possible aspiration which his office will coordinate with the patient. [MQ]    Clinical Course User Index [MQ] Sharyn Creamer, MD   ----------------------------------------- 2:28 PM on 08/26/2023 ----------------------------------------- Patient left without receiving discharge paperwork.  Was seen by  her surgeon and was aware of the plan to follow-up with them.  I am quite confident that her symptomatology is from what appears to be a lesion near her surgical site quite possibly a seroma but further follow-up as planned with surgery team.  Surgery team advising no evidence of abscess, and I am in agreement there is no fever no white count no infectious symptomatology.  Right upper extremity ultrasound reassuring.  Patient has plan for follow-up with general surgery.  Also placed referral for follow-up with vascular surgery regarding aortic aneurysm that appears to be asymptomatic  FINAL CLINICAL IMPRESSION(S) / ED DIAGNOSES   Final diagnoses:  Fluid collection at surgical site, initial encounter  Chest wall pain  Aortic aneurysm without rupture, unspecified portion of aorta [facility])     Rx / DC Orders   ED Discharge Orders          Ordered    Ambulatory referral to Vascular Surgery       Comments: Surveillance of aortic anuersym   08/26/23 1425             Note:  This document was prepared using Dragon voice recognition software and may include unintentional dictation errors.   Sharyn Creamer, MD 08/26/23 [phone]

## 2023-08-26 NOTE — ED Triage Notes (Signed)
Pt to ED from [facility] for pain to right rib cage started yesterday. Reports dx with blood clot to right arm a few weeks ago and started on blood thinner. Denies n/v. +shob with exertion  Pt In NAD, scrolling on phone in triage.

## 2023-08-26 NOTE — ED Notes (Signed)
Patient left prior to receiving discharge instructions and vitals.

## 2023-08-26 NOTE — Consult Note (Signed)
SURGICAL CONSULTATION NOTE   HISTORY OF PRESENT ILLNESS (HPI):  72 y.o. female presented to [facility] ED for evaluation of pain in the right wrist. Patient reports he was diagnosed with right upper extremity DVT and she was placed on anticoagulation.  Today she was started having pain on the right side of the ribs.  Pain localized to the right chest.  No pain radiation.  Patient cannot identify any alleviating or aggravating factors.  Patient denies any nausea or vomiting.  Patient denies any fever.  Patient denies any drainage.  At the ED she was found with negative physical exam.  No tenderness to palpation.  Labs shows no leukocytosis.  She had a CT scan of the chest to rule out PE.  There was a 4 x 3 cm rim-enhancing fluid collection on the right breast consistent with seroma with rim-enhancing, concern of abscess.  I personally evaluated the images.  Surgery is consulted by Dr. Fanny Bien in this context for evaluation and management of suspected right breast abscess.  PAST MEDICAL HISTORY (PMH):  Past Medical History:  Diagnosis Date   Arthritis    Dysrhythmia    Heart murmur    Hypertension      PAST SURGICAL HISTORY (PSH):  Past Surgical History:  Procedure Laterality Date   ABDOMINAL HYSTERECTOMY     BREAST BIOPSY Right 03/2013   BREAST BIOPSY Right 05/20/2023   MM RT BREAST BX W LOC DEV 1ST LESION IMAGE BX SPEC STEREO GUIDE 05/20/2023 [facility]   BREAST BIOPSY Right 05/30/2023   MM RT RADIO FREQUENCY TAG LOC MAMMO GUIDE 05/30/2023 [facility]   BREAST LUMPECTOMY,RADIO FREQ LOCALIZER,AXILLARY SENTINEL LYMPH NODE BIOPSY Right 05/30/2023   savi placement   CARPAL TUNNEL RELEASE Right    COLONOSCOPY     COLONOSCOPY WITH PROPOFOL N/A 03/01/2015   Procedure: COLONOSCOPY WITH PROPOFOL;  Surgeon: Midge Minium, MD;  Location: [facility];  Service: Endoscopy;  Laterality: N/A;   KNEE ARTHROSCOPY Right    KNEE ARTHROSCOPY WITH MEDIAL MENISECTOMY Right 02/01/2015   Procedure:  KNEE ARTHROSCOPY WITH MEDIAL MENISECTOMY;  Surgeon: Myra Rude, MD;  Location: [facility];  Service: Orthopedics;  Laterality: Right;   PART MASTECTOMY,RADIO FREQUENCY LOCALIZER,AXILLARY SENTINEL NODE BIOPSY Right 05/31/2023   Procedure: PARTIAL MASTECTOMY,RADIO FREQUENCY LOCALIZER,AXILLARY SENTINEL NODE BIOPSY;  Surgeon: Carolan Shiver, MD;  Location: [facility];  Service: General;  Laterality: Right;     MEDICATIONS:  Prior to Admission medications   Medication Sig Start Date End Date Taking? Authorizing Provider  acyclovir (ZOVIRAX) 400 MG tablet Take 400 mg by mouth 2 (two) times daily. Patient not taking: Reported on 05/24/2023 12/07/19   [provider]  amLODipine (NORVASC) 10 MG tablet Take 10 mg by mouth daily. 10/26/19   [provider]  anastrozole (ARIMIDEX) 1 MG tablet Take 1 tablet (1 mg total) by mouth daily. 08/22/23   Rickard Patience, MD  apixaban (ELIQUIS) 2.5 MG TABS tablet Take 1 tablet (2.5 mg total) by mouth 2 (two) times daily. 08/22/23   Rickard Patience, MD  atorvastatin (LIPITOR) 20 MG tablet Take 20 mg by mouth daily. 07/29/23   [provider]  gabapentin (NEURONTIN) 100 MG capsule Take 100 mg by mouth as needed. Patient not taking: Reported on 06/25/2023    [provider]  hydrochlorothiazide (HYDRODIURIL) 25 MG tablet Take 25 mg by mouth daily. Patient not taking: Reported on 08/22/2023    [provider]     ALLERGIES:  No Known Allergies   SOCIAL HISTORY:  Social History  Socioeconomic History   Marital status: Single    Spouse name: Not on file   Number of children: Not on file   Years of education: Not on file   Highest education level: Not on file  Occupational History   Not on file  Tobacco Use   Smoking status: Former    Current packs/day: 0.00    Average packs/day: 0.3 packs/day for 13.0 years (3.3 ttl pk-yrs)    Types: Cigarettes    Start date: 66    Quit date: 2    Years since quitting: 37.0    Smokeless tobacco: Not on file  Vaping Use   Vaping status: Never Used  Substance and Sexual Activity   Alcohol use: No   Drug use: No   Sexual activity: Yes    Birth control/protection: Post-menopausal  Other Topics Concern   Not on file  Social History Narrative   ** Merged History Encounter **  lives with granddaughter   Social Drivers of Health   Financial Resource Strain: Not on file  Food Insecurity: No Food Insecurity (05/24/2023)   Hunger Vital Sign    Worried About Running Out of Food in the Last Year: Never true    Ran Out of Food in the Last Year: Never true  Transportation Needs: No Transportation Needs (05/24/2023)   PRAPARE - Administrator, Civil Service (Medical): No    Lack of Transportation (Non-Medical): No  Physical Activity: Not on file  Stress: Not on file  Social Connections: Not on file  Intimate Partner Violence: Not At Risk (05/24/2023)   Humiliation, Afraid, Rape, and Kick questionnaire    Fear of Current or Ex-Partner: No    Emotionally Abused: No    Physically Abused: No    Sexually Abused: No      FAMILY HISTORY:  Family History  Problem Relation Age of Onset   Heart attack Mother    Breast cancer Sister 43   Multiple myeloma Brother    Breast cancer Other        40's     REVIEW OF SYSTEMS:  Constitutional: denies weight loss, fever, chills, or sweats  Eyes: denies any other vision changes, history of eye injury  ENT: denies sore throat, hearing problems  Respiratory: denies shortness of breath, wheezing  Cardiovascular: Positive chest pain Gastrointestinal: Denies abdominal pain, nausea and vomiting Genitourinary: denies burning with urination or urinary frequency Musculoskeletal: denies any other joint pains or cramps  Skin: denies any other rashes or skin discolorations  Neurological: denies any other headache, dizziness, weakness  Psychiatric: denies any other depression, anxiety   All other review of systems were  negative   VITAL SIGNS:  Temp:  [97.9 F (36.6 C)] 97.9 F (36.6 C) (12/23 0954) Pulse Rate:  [56] 56 (12/23 0955) Resp:  [20] 20 (12/23 0955) BP: (158)/(78) 158/78 (12/23 0955) SpO2:  [99 %] 99 % (12/23 0955) Weight:  [83 kg] 83 kg (12/23 0956)     Height: 5\' 7"  (170.2 cm) Weight: 83 kg BMI (Calculated): 28.65   INTAKE/OUTPUT:  This shift: No intake/output data recorded.  Last 2 shifts: @IOLAST2SHIFTS @   PHYSICAL EXAM:  Constitutional:  -- Normal body habitus  -- Awake, alert, and oriented x3  Eyes:  -- Pupils equally round and reactive to light  -- No scleral icterus  Ear, nose, and throat:  -- No jugular venous distension  Pulmonary:  -- No crackles  -- Equal breath sounds bilaterally -- Breathing non-labored at rest  Cardiovascular:  -- S1, S2 present  -- No pericardial rubs Chest: -No palpable masses on the right breast, no skin changes. Gastrointestinal:  -- Abdomen soft, nontender, non-distended, no guarding or rebound tenderness -- No abdominal masses appreciated, pulsatile or otherwise  Musculoskeletal and Integumentary:  -- Wounds: None appreciated -- Extremities: B/L UE and LE FROM, hands and feet warm, no edema  Neurologic:  -- Motor function: intact and symmetric -- Sensation: intact and symmetric   Labs:     Latest Ref Rng & Units 08/26/2023    9:58 AM 08/13/2023   12:55 PM 07/17/2023   12:56 PM  CBC  WBC 4.0 - 10.5 K/uL 6.8  6.7  7.1   Hemoglobin 12.0 - 15.0 g/dL 78.4  69.6  29.5   Hematocrit 36.0 - 46.0 % 39.7  42.7  43.3   Platelets 150 - 400 K/uL 221  241  232       Latest Ref Rng & Units 08/26/2023    9:58 AM 08/13/2023   12:55 PM 05/24/2023    1:18 PM  CMP  Glucose 70 - 99 mg/dL 284  96  132   BUN 8 - 23 mg/dL 10  12  11    Creatinine 0.44 - 1.00 mg/dL 4.40  1.02  7.25   Sodium 135 - 145 mmol/L 138  141  142   Potassium 3.5 - 5.1 mmol/L 3.4  3.1  3.4   Chloride 98 - 111 mmol/L 106  108  110   CO2 22 - 32 mmol/L 22  24  24     Calcium 8.9 - 10.3 mg/dL 9.8  36.6  44.0   Total Protein 6.5 - 8.1 g/dL 6.8   7.9   Total Bilirubin <1.2 mg/dL 0.6   0.7   Alkaline Phos 38 - 126 U/L 108   121   AST 15 - 41 U/L 23   26   ALT 0 - 44 U/L 19   29     Imaging studies:  EXAM: CT ANGIOGRAPHY CHEST WITH CONTRAST   TECHNIQUE: Multidetector CT imaging of the chest was performed using the standard protocol during bolus administration of intravenous contrast. Multiplanar CT image reconstructions and MIPs were obtained to evaluate the vascular anatomy.   RADIATION DOSE REDUCTION: This exam was performed according to the departmental dose-optimization program which includes automated exposure control, adjustment of the mA and/or kV according to patient size and/or use of iterative reconstruction technique.   CONTRAST:  75mL OMNIPAQUE IOHEXOL 350 MG/ML SOLN   COMPARISON:  Same day chest radiograph   FINDINGS: Cardiovascular: The study is high quality for the evaluation of pulmonary embolism. There are no filling defects in the central, lobar, segmental or subsegmental pulmonary artery branches to suggest acute pulmonary embolism. Ascending thoracic aorta measures 4.1 x 4.1 cm. Normal heart size. No significant pericardial fluid/thickening. Coronary artery calcifications and aortic atherosclerosis.   Mediastinum/Nodes: Imaged thyroid gland without nodules meeting criteria for imaging follow-up by size. Normal esophagus. 1.1 cm right hilar lymph node (5:58).   Lungs/Pleura: The central airways are patent. Biapical pleural-parenchymal scarring. No focal consolidation. No pneumothorax. No pleural effusion.   Upper abdomen: Normal.   Musculoskeletal: No acute or abnormal lytic or blastic osseous lesions. Mildly rim enhancing 3.9 x 3.0 cm fluid collection within the upper outer right breast (5:72) associated with overlying cutaneous thickening of the right breast and adjacent surgical clips. Postsurgical changes of  the right axilla.   Review of the MIP images confirms the above  findings.   IMPRESSION: 1. No evidence of pulmonary embolism. 2. Mildly rim enhancing 3.9 x 3.0 cm fluid collection within the upper outer right breast associated with overlying cutaneous thickening of the right breast and adjacent surgical clips, likely postsurgical seroma or posttreatment changes. 3. Enlarged right hilar lymph node, likely reactive. 4. Ascending thoracic aorta measures 4.1 cm. Recommend annual imaging followup by CTA or MRA. This recommendation follows 2010 ACCF/AHA/AATS/ACR/ASA/SCA/SCAI/SIR/STS/SVM Guidelines for the Diagnosis and Management of Patients with Thoracic Aortic Disease. Circulation. 2010; 121: D664-Q034. Aortic aneurysm NOS (ICD10-I71.9) 5. Aortic Atherosclerosis (ICD10-I70.0). Coronary artery calcifications. Assessment for potential risk factor modification, dietary therapy or pharmacologic therapy may be warranted, if clinically indicated.     Electronically Signed   By: Agustin Cree M.D.   On: 08/26/2023 12:37  Assessment/Plan:  72 y.o. female with suspected right breast abscess, complicated by pertinent comorbidities including right upper extremity DVT.  Patient was found with 4 cm fluid collection in the right breast at the location of pain.  I have low suspicious of infected seroma but due to the complaint of pain on that area I think that is reasonable to explore the alternative of percutaneous aspiration.  No indication for admission from the breast fluid collection standpoint.  I coordinated an appointment at the breast center for ultrasound-guided aspiration of the right breast fluid collection to see if this improves her pain and to rule out infected seroma.  Patient reports she understood and agreed with this plan.   Gae Gallop, MD

## 2023-08-29 ENCOUNTER — Other Ambulatory Visit: Payer: Self-pay | Admitting: General Surgery

## 2023-08-29 DIAGNOSIS — N611 Abscess of the breast and nipple: Secondary | ICD-10-CM

## 2023-09-02 ENCOUNTER — Inpatient Hospital Stay: Admission: RE | Admit: 2023-09-02 | Payer: Medicare HMO | Source: Ambulatory Visit

## 2023-09-02 ENCOUNTER — Encounter (INDEPENDENT_AMBULATORY_CARE_PROVIDER_SITE_OTHER): Payer: Self-pay

## 2023-09-02 ENCOUNTER — Other Ambulatory Visit: Payer: Medicare HMO

## 2023-09-05 ENCOUNTER — Ambulatory Visit
Admission: RE | Admit: 2023-09-05 | Discharge: 2023-09-05 | Disposition: A | Discharge status: Home / Self Care | Payer: No Typology Code available for payment source | Source: Ambulatory Visit | Attending: General Surgery | Admitting: General Surgery

## 2023-09-05 ENCOUNTER — Ambulatory Visit
Admission: RE | Admit: 2023-09-05 | Discharge: 2023-09-05 | Disposition: A | Discharge status: Home / Self Care | Payer: No Typology Code available for payment source | Source: Ambulatory Visit | Attending: Oncology | Admitting: Oncology

## 2023-09-05 DIAGNOSIS — Z780 Asymptomatic menopausal state: Secondary | ICD-10-CM | POA: Diagnosis not present

## 2023-09-05 DIAGNOSIS — D0511 Intraductal carcinoma in situ of right breast: Secondary | ICD-10-CM | POA: Diagnosis present

## 2023-09-05 DIAGNOSIS — M1990 Unspecified osteoarthritis, unspecified site: Secondary | ICD-10-CM | POA: Insufficient documentation

## 2023-09-05 DIAGNOSIS — N611 Abscess of the breast and nipple: Secondary | ICD-10-CM | POA: Diagnosis present

## 2023-09-05 DIAGNOSIS — Z923 Personal history of irradiation: Secondary | ICD-10-CM | POA: Insufficient documentation

## 2023-09-05 DIAGNOSIS — M85851 Other specified disorders of bone density and structure, right thigh: Secondary | ICD-10-CM | POA: Insufficient documentation

## 2023-09-05 DIAGNOSIS — Z13820 Encounter for screening for osteoporosis: Secondary | ICD-10-CM | POA: Insufficient documentation

## 2023-09-05 HISTORY — DX: Personal history of irradiation: Z92.3

## 2023-09-05 MED ORDER — LIDOCAINE HCL 1 % IJ SOLN
5.0000 mL | Freq: Once | INTRAMUSCULAR | Status: AC
  Administered 2023-09-05: 2 mL
  Filled 2023-09-05: qty 5

## 2023-09-09 LAB — BODY FLUID CULTURE W GRAM STAIN
Culture: NO GROWTH
Gram Stain: NONE SEEN

## 2023-09-12 ENCOUNTER — Ambulatory Visit: Payer: Medicare HMO | Admitting: Radiation Oncology

## 2023-09-23 ENCOUNTER — Ambulatory Visit
Admission: RE | Admit: 2023-09-23 | Discharge: 2023-09-23 | Disposition: A | Discharge status: Home / Self Care | Payer: No Typology Code available for payment source | Source: Ambulatory Visit | Attending: Radiation Oncology | Admitting: Radiation Oncology

## 2023-09-23 ENCOUNTER — Ambulatory Visit: Payer: No Typology Code available for payment source | Admitting: Radiation Oncology

## 2023-09-23 VITALS — BP 178/93 | HR 86 | Temp 97.0°F | Resp 16 | Ht 67.0 in | Wt 181.0 lb

## 2023-09-23 DIAGNOSIS — C50911 Malignant neoplasm of unspecified site of right female breast: Secondary | ICD-10-CM | POA: Insufficient documentation

## 2023-09-23 DIAGNOSIS — Z170 Estrogen receptor positive status [ER+]: Secondary | ICD-10-CM | POA: Insufficient documentation

## 2023-09-23 NOTE — Progress Notes (Signed)
Radiation Oncology Follow up Note  Name: Kim Ruiz   Date:   09/23/2023 MRN:  [id number] DOB: 02-Mar-1951    This 73 y.o. 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  Seen today in routine follow-up she is doing fairly well she has had some fluid drainage from her seroma cavity which is still be under the attention of her surgeon.  She specifically denies breast tenderness cough or bone pain..  She is currently on Arimidex tolerating it well without side effect  COMPLICATIONS OF TREATMENT: none  FOLLOW UP COMPLIANCE: keeps appointments   PHYSICAL EXAM:  BP (!) 178/93   Pulse 86   Temp (!) 97 F (36.1 C)   Resp 16   Ht 5\' 7"  (1.702 m)   Wt 181 lb (82.1 kg)   BMI 28.35 kg/m  Right breast is somewhat firm and hyperpigmented.  Consistent with evolving seroma.  No dominant masses noted in either breast no axillary or supraclavicular adenopathy is identified.  Well-developed well-nourished patient in NAD. HEENT reveals PERLA, EOMI, discs not visualized.  Oral cavity is clear. No oral mucosal lesions are identified. Neck is clear without evidence of cervical or supraclavicular adenopathy. Lungs are clear to A&P. Cardiac examination is essentially unremarkable with regular rate and rhythm without murmur rub or thrill. Abdomen is benign with no organomegaly or masses noted. Motor sensory and DTR levels are equal and symmetric in the upper and lower extremities. Cranial nerves II through XII are grossly intact. Proprioception is intact. No peripheral adenopathy or edema is identified. No motor or sensory levels are noted. Crude visual fields are within normal  range.  RADIOLOGY RESULTS: No current films for review  PLAN: Present time patient is doing well she continues observation by her surgeon for recurrent seroma.  Of asked to see her back in 6 months for follow-up.  She continues on Arimidex without side effect.  I would like to take this opportunity to thank you for allowing me to participate in the care of your patient.Carmina Miller, MD

## 2023-09-26 ENCOUNTER — Ambulatory Visit: Payer: No Typology Code available for payment source | Admitting: Cardiology

## 2023-09-30 ENCOUNTER — Ambulatory Visit: Payer: No Typology Code available for payment source | Admitting: Radiation Oncology

## 2023-10-11 ENCOUNTER — Other Ambulatory Visit: Payer: Self-pay | Admitting: Oncology

## 2023-10-24 ENCOUNTER — Encounter (INDEPENDENT_AMBULATORY_CARE_PROVIDER_SITE_OTHER): Payer: Self-pay

## 2023-11-21 ENCOUNTER — Inpatient Hospital Stay (HOSPITAL_BASED_OUTPATIENT_CLINIC_OR_DEPARTMENT_OTHER): Payer: Medicare HMO | Admitting: Oncology

## 2023-11-21 ENCOUNTER — Inpatient Hospital Stay: Payer: Medicare HMO | Attending: Oncology

## 2023-11-21 ENCOUNTER — Encounter: Payer: Self-pay | Admitting: Oncology

## 2023-11-21 VITALS — BP 151/66 | HR 63 | Temp 98.1°F | Resp 18 | Wt 176.2 lb

## 2023-11-21 DIAGNOSIS — D0511 Intraductal carcinoma in situ of right breast: Secondary | ICD-10-CM | POA: Diagnosis not present

## 2023-11-21 DIAGNOSIS — M8580 Other specified disorders of bone density and structure, unspecified site: Secondary | ICD-10-CM | POA: Diagnosis not present

## 2023-11-21 DIAGNOSIS — I82611 Acute embolism and thrombosis of superficial veins of right upper extremity: Secondary | ICD-10-CM

## 2023-11-21 DIAGNOSIS — Z803 Family history of malignant neoplasm of breast: Secondary | ICD-10-CM

## 2023-11-21 DIAGNOSIS — Z79811 Long term (current) use of aromatase inhibitors: Secondary | ICD-10-CM | POA: Diagnosis not present

## 2023-11-21 DIAGNOSIS — E876 Hypokalemia: Secondary | ICD-10-CM | POA: Insufficient documentation

## 2023-11-21 DIAGNOSIS — Z170 Estrogen receptor positive status [ER+]: Secondary | ICD-10-CM | POA: Insufficient documentation

## 2023-11-21 DIAGNOSIS — Z79899 Other long term (current) drug therapy: Secondary | ICD-10-CM | POA: Diagnosis not present

## 2023-11-21 DIAGNOSIS — Z923 Personal history of irradiation: Secondary | ICD-10-CM | POA: Insufficient documentation

## 2023-11-21 DIAGNOSIS — Z86718 Personal history of other venous thrombosis and embolism: Secondary | ICD-10-CM | POA: Insufficient documentation

## 2023-11-21 DIAGNOSIS — Z807 Family history of other malignant neoplasms of lymphoid, hematopoietic and related tissues: Secondary | ICD-10-CM | POA: Diagnosis not present

## 2023-11-21 LAB — CMP (CANCER CENTER ONLY)
ALT: 18 U/L (ref 0–44)
AST: 20 U/L (ref 15–41)
Albumin: 4.2 g/dL (ref 3.5–5.0)
Alkaline Phosphatase: 105 U/L (ref 38–126)
Anion gap: 9 (ref 5–15)
BUN: 21 mg/dL (ref 8–23)
CO2: 23 mmol/L (ref 22–32)
Calcium: 10.3 mg/dL (ref 8.9–10.3)
Chloride: 107 mmol/L (ref 98–111)
Creatinine: 0.8 mg/dL (ref 0.44–1.00)
GFR, Estimated: >60 mL/min (ref >60)
Glucose, Bld: 89 mg/dL (ref 70–99)
Potassium: 3.3 mmol/L — ABNORMAL LOW (ref 3.5–5.1)
Sodium: 139 mmol/L (ref 135–145)
Total Bilirubin: 0.7 mg/dL (ref 0.0–1.2)
Total Protein: 7.1 g/dL (ref 6.5–8.1)

## 2023-11-21 LAB — CBC WITH DIFFERENTIAL (CANCER CENTER ONLY)
Abs Immature Granulocytes: 0.02 10*3/uL (ref 0.00–0.07)
Basophils Absolute: 0.1 10*3/uL (ref 0.0–0.1)
Basophils Relative: 1 %
Eosinophils Absolute: 0.1 10*3/uL (ref 0.0–0.5)
Eosinophils Relative: 1 %
HCT: 40.5 % (ref 36.0–46.0)
Hemoglobin: 12.5 g/dL (ref 12.0–15.0)
Immature Granulocytes: 0 %
Lymphocytes Relative: 35 %
Lymphs Abs: 2.1 10*3/uL (ref 0.7–4.0)
MCH: 25.1 pg — ABNORMAL LOW (ref 26.0–34.0)
MCHC: 30.9 g/dL (ref 30.0–36.0)
MCV: 81.3 fL (ref 80.0–100.0)
Monocytes Absolute: 0.4 10*3/uL (ref 0.1–1.0)
Monocytes Relative: 7 %
Neutro Abs: 3.3 10*3/uL (ref 1.7–7.7)
Neutrophils Relative %: 56 %
Platelet Count: 250 10*3/uL (ref 150–400)
RBC: 4.98 MIL/uL (ref 3.87–5.11)
RDW: 13.3 % (ref 11.5–15.5)
WBC Count: 5.9 10*3/uL (ref 4.0–10.5)
nRBC: 0 % (ref 0.0–0.2)

## 2023-11-21 MED ORDER — CALCIUM CARB-CHOLECALCIFEROL 600-20 MG-MCG PO TABS: 1200.0000 mg | ORAL_TABLET | Freq: Every day | ORAL | Status: DC

## 2023-11-21 MED ORDER — ANASTROZOLE 1 MG PO TABS
1.0000 mg | ORAL_TABLET | Freq: Every day | ORAL | 1 refills | Status: DC
Start: 2023-11-21 — End: 2024-05-21

## 2023-11-21 NOTE — Assessment & Plan Note (Signed)
 Referred to genetic counseling. She had a discussion and declined testing.

## 2023-11-21 NOTE — Progress Notes (Signed)
 Hematology/Oncology Progress note [phone] [phone]        REFERRING PROVIDER: Lorn Junes, FNP    CHIEF COMPLAINTS/PURPOSE OF CONSULTATION:  Right breast high grade DCIS  ASSESSMENT & PLAN:   Cancer Staging  Ductal carcinoma in situ (DCIS) of right breast Staging form: Breast, AJCC 8th Edition - Clinical stage from 05/24/2023: Stage 0 (cTis (DCIS), cN0, cM0) - Signed by Rickard Patience, MD on 05/24/2023   Ductal carcinoma in situ (DCIS) of right breast Right breast DCIS s/p lumpectomy and SLNB. ER 95% positive, status postlumpectomy and adjuvant radiation.  On Arimidex 1 mg daily. She tolerates well.  Continue current regimen.  Recommend calcium and vitamin D supplementation.   Osteopenia 09/05/23 DEXA showed osteopenia, major osteoporotic fracture risk is 3.2% in 10 years.  Recommend calcium 1200mg  and vitamin D supplementation. Repeat DEXA in 2027  Superficial venous thrombosis of arm, right S/p Eliquis 2.5mg  BID for 4 weeks.  Symptoms completely resolved.    Family history of breast cancer Referred to genetic counseling. She had a discussion and declined testing.   Hypokalemia This is a chronic issue for her.  Recommend K rich diet. I encourage patient to discuss with PCP    Orders Placed This Encounter  Procedures   CBC with Differential (Cancer Center Only)    Standing Status:   Future    Expected Date:   05/23/2024    Expiration Date:   11/20/2024   CMP (Cancer Center only)    Standing Status:   Future    Expected Date:   05/23/2024    Expiration Date:   11/20/2024   Follow up 6 months All questions were answered. The patient knows to call the clinic with any problems, questions or concerns.  Rickard Patience, MD, PhD [facility] Hematology Oncology 11/21/2023    HISTORY OF PRESENTING ILLNESS:  Kim Ruiz 73 y.o. female presents to establish care for right breast DCIS I have reviewed her chart and materials related to her  cancer extensively and collaborated history with the patient. Summary of oncologic history is as follows: Oncology History  Ductal carcinoma in situ (DCIS) of right breast  04/30/2023 Imaging   Bilateral screening mammogram showed  In the right breast, calcifications warrant further evaluation with magnified views. In the left breast, no findings suspicious for malignancy.     05/10/2023 Mammogram   Right diagnostic mammogram showed Magnified views are performed of calcifications in the UPPER-OUTER QUADRANT of the RIGHT breast. These views demonstrate a 6 millimeter group of pleomorphic and linear calcifications.   05/24/2023 Initial Diagnosis   Ductal carcinoma in situ (DCIS) of right breast  05/20/2023  1. Breast, right, needle core biopsy, upper outer posterior depth, ribbon clip:  DCIS, high grade, comedo -type  Necrosis present, calcification present, fibrocystic changes, adenosis.    Menarche at age of 44 No children OCP use: No History of hysterectomy: yes  Menopausal status: postmenopausal History of HRT use: No History of chest radiation: No  Number of previous breast biopsies:  No   05/24/2023 Cancer Staging   Staging form: Breast, AJCC 8th Edition - Clinical stage from 05/24/2023: Stage 0 (cTis (DCIS), cN0, cM0) - Signed by Rickard Patience, MD on 05/24/2023 Stage prefix: Initial diagnosis Nuclear grade: G3   05/31/2023 Surgery   Patient underwent lumpectomy and also got SLNB due to the upper outer quadrant location and being high-grade with comedonecrosis   1. Breast, lumpectomy, Right mass :      - RESIDUAL DUCTAL  CARCINOMA IN SITU (DCIS).      - SEE CANCER SUMMARY BELOW.      - BIOPSY SITE CHANGE WITH ASSOCIATED CLIP.      - SCOUT TAG PRESENT.       2. Breast, excision, Anterior margin :      - BENIGN BREAST TISSUE; NEGATIVE FOR MALIGNANCY.       3. Breast, excision, Superior margin :      - BENIGN BREAST TISSUE; NEGATIVE FOR MALIGNANCY.       4. Lymph node, sentinel,  biopsy, Right axillary #1 :      - ONE LYMPH NODE NEGATIVE FOR METASTATIC CARCINOMA (0/1).      - SEE NOTE.       5. Lymph node, sentinel, biopsy, right axillary #2 :      - ONE LYMPH NODE NEGATIVE FOR METASTATIC CARCINOMA (0/1).      - SEE NOTE.       6. Lymph node, sentinel, biopsy, right axillary #3 :      - ONE LYMPH NODE NEGATIVE FOR METASTATIC CARCINOMA (0/1).      - SEE NOTE.       7. Lymph node, sentinel, biopsy, right axillary #4 :      - ONE LYMPH NODE NEGATIVE FOR METASTATIC CARCINOMA (0/1).      - SEE NOTE.   MARGINS  Margin Status: All margins negative for DCIS by a distance of at least 2 mm   REGIONAL LYMPH NODES  Regional Lymph node Status: All regional lymph nodes negative for tumor  Total Number of Lymph Nodes Examined (sentinel and non-sentinel): 4  Number of Sentinel Nodes Examined: 4   ER 95% positive   07/10/2023 - 08/09/2023 Radiation Therapy   Adjuvant right breast radiation    08/13/2023, right upper extremity venous ultrasound was obtained which showed no DVT.  Questionable superficial venous thrombosis of a branch of the cephalic vein in the medial upper arm. She has finished 4 weeks of Eliquis 2.5mg  BID and swelling has completely resolved.   Patient reports feeling well today. She tolerates Arimidex with no significant side effects.    MEDICAL HISTORY:  Past Medical History:  Diagnosis Date   Arthritis    Dysrhythmia    Heart murmur    Hypertension    Personal history of radiation therapy     SURGICAL HISTORY: Past Surgical History:  Procedure Laterality Date   ABDOMINAL HYSTERECTOMY     BREAST BIOPSY Right 03/2013   BREAST BIOPSY Right 05/20/2023   MM RT BREAST BX W LOC DEV 1ST LESION IMAGE BX SPEC STEREO GUIDE 05/20/2023 [facility]   BREAST BIOPSY Right 05/30/2023   MM RT RADIO FREQUENCY TAG LOC MAMMO GUIDE 05/30/2023 [facility]   BREAST LUMPECTOMY Right 05/31/2023   BREAST LUMPECTOMY,RADIO FREQ LOCALIZER,AXILLARY SENTINEL  LYMPH NODE BIOPSY Right 05/30/2023   savi placement   CARPAL TUNNEL RELEASE Right    COLONOSCOPY     COLONOSCOPY WITH PROPOFOL N/A 03/01/2015   Procedure: COLONOSCOPY WITH PROPOFOL;  Surgeon: Midge Minium, MD;  Location: [facility];  Service: Endoscopy;  Laterality: N/A;   KNEE ARTHROSCOPY Right    KNEE ARTHROSCOPY WITH MEDIAL MENISECTOMY Right 02/01/2015   Procedure: KNEE ARTHROSCOPY WITH MEDIAL MENISECTOMY;  Surgeon: Myra Rude, MD;  Location: [facility];  Service: Orthopedics;  Laterality: Right;   PART MASTECTOMY,RADIO FREQUENCY LOCALIZER,AXILLARY SENTINEL NODE BIOPSY Right 05/31/2023   Procedure: PARTIAL MASTECTOMY,RADIO FREQUENCY LOCALIZER,AXILLARY SENTINEL NODE BIOPSY;  Surgeon: Carolan Shiver, MD;  Location: [facility];  Service: General;  Laterality: Right;    SOCIAL HISTORY: Social History   Socioeconomic History   Marital status: Single    Spouse name: Not on file   Number of children: Not on file   Years of education: Not on file   Highest education level: Not on file  Occupational History   Not on file  Tobacco Use   Smoking status: Former    Current packs/day: 0.00    Average packs/day: 0.3 packs/day for 13.0 years (3.3 ttl pk-yrs)    Types: Cigarettes    Start date: 49    Quit date: 69    Years since quitting: 37.2   Smokeless tobacco: Not on file  Vaping Use   Vaping status: Never Used  Substance and Sexual Activity   Alcohol use: No   Drug use: No   Sexual activity: Yes    Birth control/protection: Post-menopausal  Other Topics Concern   Not on file  Social History Narrative   ** Merged History Encounter **  lives with granddaughter   Social Drivers of Health   Financial Resource Strain: Not on file  Food Insecurity: No Food Insecurity (05/24/2023)   Hunger Vital Sign    Worried About Running Out of Food in the Last Year: Never true    Ran Out of Food in the Last Year: Never true  Transportation Needs: No Transportation Needs  (05/24/2023)   PRAPARE - Administrator, Civil Service (Medical): No    Lack of Transportation (Non-Medical): No  Physical Activity: Not on file  Stress: Not on file  Social Connections: Not on file  Intimate Partner Violence: Not At Risk (05/24/2023)   Humiliation, Afraid, Rape, and Kick questionnaire    Fear of Current or Ex-Partner: No    Emotionally Abused: No    Physically Abused: No    Sexually Abused: No    FAMILY HISTORY: Family History  Problem Relation Age of Onset   Heart attack Mother    Breast cancer Sister 32   Multiple myeloma Brother    Breast cancer Other        40's    ALLERGIES:  has no known allergies.  MEDICATIONS:  Current Outpatient Medications  Medication Sig Dispense Refill   amLODipine (NORVASC) 10 MG tablet Take 10 mg by mouth daily.     atorvastatin (LIPITOR) 20 MG tablet Take 20 mg by mouth daily.     Calcium Carb-Cholecalciferol (CALCIUM 600+D) 600-20 MG-MCG TABS Take 1,200 mg by mouth daily.     acyclovir (ZOVIRAX) 400 MG tablet Take 400 mg by mouth 2 (two) times daily. (Patient not taking: Reported on 05/24/2023)     anastrozole (ARIMIDEX) 1 MG tablet Take 1 tablet (1 mg total) by mouth daily. 90 tablet 1   gabapentin (NEURONTIN) 100 MG capsule Take 100 mg by mouth as needed. (Patient not taking: Reported on 06/25/2023)     hydrochlorothiazide (HYDRODIURIL) 25 MG tablet Take 25 mg by mouth daily. (Patient not taking: Reported on 08/22/2023)     No current facility-administered medications for this visit.   Facility-Administered Medications Ordered in Other Visits  Medication Dose Route Frequency Provider Last Rate Last Admin   dexamethasone (DECADRON) injection   Intravenous Anesthesia Intra-op Emeterio Reeve, CRNA   10 mg at 05/31/23 1238   dexmedetomidine (PRECEDEX) 80 MCG/20ML   Intravenous Anesthesia Intra-op Emeterio Reeve, CRNA   4 mcg at 05/31/23 1219   fentaNYL (SUBLIMAZE) injection   Intravenous Anesthesia Intra-op  Emeterio Reeve, CRNA  50 mcg at 05/31/23 1321   lidocaine (cardiac) 100 mg/46mL (XYLOCAINE) injection 2%   Intravenous Anesthesia Intra-op Emeterio Reeve, CRNA   80 mg at 05/31/23 1135   midazolam (VERSED) injection   Intravenous Anesthesia Intra-op Emeterio Reeve, CRNA   1 mg at 05/31/23 1129   ondansetron (ZOFRAN) injection   Intravenous Anesthesia Intra-op Emeterio Reeve, CRNA   4 mg at 05/31/23 1238   propofol (DIPRIVAN) 10 mg/mL bolus/IV push   Intravenous Anesthesia Intra-op Emeterio Reeve, CRNA   150 mg at 05/31/23 1135   rocuronium (ZEMURON) injection   Intravenous Anesthesia Intra-op Emeterio Reeve, CRNA   50 mg at 05/31/23 1135   sugammadex sodium (BRIDION) injection   Intravenous Anesthesia Intra-op Emeterio Reeve, CRNA   161.4 mg at 05/31/23 1307    Review of Systems  Constitutional:  Negative for appetite change, chills, fatigue and fever.  HENT:   Negative for hearing loss and voice change.   Eyes:  Negative for eye problems.  Respiratory:  Negative for chest tightness and cough.   Cardiovascular:  Negative for chest pain.  Gastrointestinal:  Negative for abdominal distention, abdominal pain and blood in stool.  Endocrine: Negative for hot flashes.  Genitourinary:  Negative for difficulty urinating and frequency.   Musculoskeletal:  Negative for arthralgias.  Skin:  Negative for itching and rash.  Neurological:  Negative for extremity weakness.  Hematological:  Negative for adenopathy.  Psychiatric/Behavioral:  Negative for confusion.      PHYSICAL EXAMINATION: ECOG PERFORMANCE STATUS: 0 - Asymptomatic  Vitals:   11/21/23 1009  BP: (!) 151/66  Pulse: 63  Resp: 18  Temp: 98.1 F (36.7 C)  SpO2: 100%   Filed Weights   11/21/23 1009  Weight: 176 lb 3.2 oz (79.9 kg)     Physical Exam Constitutional:      General: She is not in acute distress.    Appearance: She is not diaphoretic.  HENT:     Head: Normocephalic.  Eyes:      General: No scleral icterus. Cardiovascular:     Rate and Rhythm: Normal rate.  Pulmonary:     Effort: Pulmonary effort is normal. No respiratory distress.  Abdominal:     General: There is no distension.  Musculoskeletal:        General: Normal range of motion.     Cervical back: Normal range of motion and neck supple.     Comments: Right upper extremity swelling and tenderness completely resolved.   Skin:    Findings: No rash.  Neurological:     Mental Status: She is alert and oriented to person, place, and time. Mental status is at baseline.     Cranial Nerves: No cranial nerve deficit.     Motor: No abnormal muscle tone.  Psychiatric:        Mood and Affect: Affect normal.      LABORATORY DATA:  I have reviewed the data as listed    Latest Ref Rng & Units 11/21/2023    9:54 AM 08/26/2023    9:58 AM 08/13/2023   12:55 PM  CBC  WBC 4.0 - 10.5 K/uL 5.9  6.8  6.7   Hemoglobin 12.0 - 15.0 g/dL 16.1  09.6  04.5   Hematocrit 36.0 - 46.0 % 40.5  39.7  42.7   Platelets 150 - 400 K/uL 250  221  241       Latest Ref Rng & Units 11/21/2023    9:54 AM 08/26/2023    9:58 AM  08/13/2023   12:55 PM  CMP  Glucose 70 - 99 mg/dL 89  010  96   BUN 8 - 23 mg/dL 21  10  12    Creatinine 0.44 - 1.00 mg/dL 2.72  5.36  6.44   Sodium 135 - 145 mmol/L 139  138  141   Potassium 3.5 - 5.1 mmol/L 3.3  3.4  3.1   Chloride 98 - 111 mmol/L 107  106  108   CO2 22 - 32 mmol/L 23  22  24    Calcium 8.9 - 10.3 mg/dL 03.4  9.8  74.2   Total Protein 6.5 - 8.1 g/dL 7.1  6.8    Total Bilirubin 0.0 - 1.2 mg/dL 0.7  0.6    Alkaline Phos 38 - 126 U/L 105  108    AST 15 - 41 U/L 20  23    ALT 0 - 44 U/L 18  19       RADIOGRAPHIC STUDIES: I have personally reviewed the radiological images as listed and agreed with the findings in the report. No results found.

## 2023-11-21 NOTE — Assessment & Plan Note (Addendum)
 S/p Eliquis 2.5mg  BID for 4 weeks.  Symptoms completely resolved.

## 2023-11-21 NOTE — Assessment & Plan Note (Signed)
 This is a chronic issue for her.  Recommend K rich diet. I encourage patient to discuss with PCP

## 2023-11-21 NOTE — Assessment & Plan Note (Addendum)
 09/05/23 DEXA showed osteopenia, major osteoporotic fracture risk is 3.2% in 10 years.  Recommend calcium 1200mg  and vitamin D supplementation. Repeat DEXA in 2027

## 2023-11-21 NOTE — Assessment & Plan Note (Addendum)
 Right breast DCIS s/p lumpectomy and SLNB. ER 95% positive, status postlumpectomy and adjuvant radiation.  On Arimidex 1 mg daily. She tolerates well.  Continue current regimen.  Recommend calcium and vitamin D supplementation.

## 2023-11-25 ENCOUNTER — Encounter (INDEPENDENT_AMBULATORY_CARE_PROVIDER_SITE_OTHER): Payer: Self-pay | Admitting: Vascular Surgery

## 2023-12-08 DIAGNOSIS — I712 Thoracic aortic aneurysm, without rupture, unspecified: Secondary | ICD-10-CM | POA: Insufficient documentation

## 2023-12-08 NOTE — Progress Notes (Unsigned)
 MRN : [id number]  Kim Ruiz is a 73 y.o. (02-Mar-1951) female who presents with chief complaint of check circulation.  History of Present Illness:   The patient presents to the office for evaluation of an ascending thoracic aortic aneurysm. The aneurysm was found incidentally by CT scan. Patient denies chest pain or unusual back pain, no other chest or abdominal complaints.  No history of an abrupt onset of a painful toe associated with blue discoloration.     No family history of TAA/AAA.   Patient denies amaurosis fugax or TIA symptoms.  There is no history of claudication or rest pain symptoms of the lower extremities.   The patient denies angina or shortness of breath.  CT scan dated 08/26/2023, shows an ascending TAA that measures 4.1 cm   No outpatient medications have been marked as taking for the 12/09/23 encounter (Appointment) with Gilda Crease, Latina Craver, MD.    Past Medical History:  Diagnosis Date   Arthritis    Dysrhythmia    Heart murmur    Hypertension    Personal history of radiation therapy     Past Surgical History:  Procedure Laterality Date   ABDOMINAL HYSTERECTOMY     BREAST BIOPSY Right 03/2013   BREAST BIOPSY Right 05/20/2023   MM RT BREAST BX W LOC DEV 1ST LESION IMAGE BX SPEC STEREO GUIDE 05/20/2023 [facility]   BREAST BIOPSY Right 05/30/2023   MM RT RADIO FREQUENCY TAG LOC MAMMO GUIDE 05/30/2023 [facility]   BREAST LUMPECTOMY Right 05/31/2023   BREAST LUMPECTOMY,RADIO FREQ LOCALIZER,AXILLARY SENTINEL LYMPH NODE BIOPSY Right 05/30/2023   savi placement   CARPAL TUNNEL RELEASE Right    COLONOSCOPY     COLONOSCOPY WITH PROPOFOL N/A 03/01/2015   Procedure: COLONOSCOPY WITH PROPOFOL;  Surgeon: Midge Minium, MD;  Location: [facility];  Service: Endoscopy;  Laterality: N/A;   KNEE ARTHROSCOPY Right    KNEE ARTHROSCOPY WITH MEDIAL MENISECTOMY Right 02/01/2015    Procedure: KNEE ARTHROSCOPY WITH MEDIAL MENISECTOMY;  Surgeon: Myra Rude, MD;  Location: [facility];  Service: Orthopedics;  Laterality: Right;   PART MASTECTOMY,RADIO FREQUENCY LOCALIZER,AXILLARY SENTINEL NODE BIOPSY Right 05/31/2023   Procedure: PARTIAL MASTECTOMY,RADIO FREQUENCY LOCALIZER,AXILLARY SENTINEL NODE BIOPSY;  Surgeon: Carolan Shiver, MD;  Location: [facility];  Service: General;  Laterality: Right;    Social History Social History   Tobacco Use   Smoking status: Former    Current packs/day: 0.00    Average packs/day: 0.3 packs/day for 13.0 years (3.3 ttl pk-yrs)    Types: Cigarettes    Start date: 8    Quit date: 1988    Years since quitting: 37.2  Vaping Use   Vaping status: Never Used  Substance Use Topics   Alcohol use: No   Drug use: No    Family History Family History  Problem Relation Age of Onset   Heart attack Mother    Breast cancer Sister 57   Multiple myeloma Brother    Breast cancer Other        40's    No Known Allergies   REVIEW OF SYSTEMS (Negative unless checked)  Constitutional: []   Weight loss  [] Fever  [] Chills Cardiac: [] Chest pain   [] Chest pressure   [] Palpitations   [] Shortness of breath when laying flat   [] Shortness of breath with exertion. Vascular:  [x] Pain in legs with walking   [] Pain in legs at rest  [] History of DVT   [] Phlebitis   [] Swelling in legs   [] Varicose veins   [] Non-healing ulcers Pulmonary:   [] Uses home oxygen   [] Productive cough   [] Hemoptysis   [] Wheeze  [] COPD   [] Asthma Neurologic:  [] Dizziness   [] Seizures   [] History of stroke   [] History of TIA  [] Aphasia   [] Vissual changes   [] Weakness or numbness in arm   [] Weakness or numbness in leg Musculoskeletal:   [] Joint swelling   [] Joint pain   [] Low back pain Hematologic:  [] Easy bruising  [] Easy bleeding   [] Hypercoagulable state   [] Anemic Gastrointestinal:  [] Diarrhea   [] Vomiting  [] Gastroesophageal reflux/heartburn   [] Difficulty  swallowing. Genitourinary:  [] Chronic kidney disease   [] Difficult urination  [] Frequent urination   [] Blood in urine Skin:  [] Rashes   [] Ulcers  Psychological:  [] History of anxiety   []  History of major depression.  Physical Examination  There were no vitals filed for this visit. There is no height or weight on file to calculate BMI. Gen: WD/WN, NAD Head: [place]/AT, No temporalis wasting.  Ear/Nose/Throat: Hearing grossly intact, nares w/o erythema or drainage Eyes: PER, EOMI, sclera nonicteric.  Neck: Supple, no masses.  No bruit or JVD.  Pulmonary:  Good air movement, no audible wheezing, no use of accessory muscles.  Cardiac: RRR, normal S1, S2, no Murmurs. Vascular: Popliteal pulses are normal Vessel Right Left  Radial Palpable Palpable  Gastrointestinal: soft, non-distended. No guarding/no peritoneal signs.  Musculoskeletal: M/S 5/5 throughout.  No visible deformity.  Neurologic: CN 2-12 intact. Pain and light touch intact in extremities.  Symmetrical.  Speech is fluent. Motor exam as listed above. Psychiatric: Judgment intact, Mood & affect appropriate for pt's clinical situation. Dermatologic: No rashes or ulcers noted.  No changes consistent with cellulitis.   CBC Lab Results  Component Value Date   WBC 5.9 11/21/2023   HGB 12.5 11/21/2023   HCT 40.5 11/21/2023   MCV 81.3 11/21/2023   PLT 250 11/21/2023    BMET    Component Value Date/Time   NA 139 11/21/2023 0954   K 3.3 (L) 11/21/2023 0954   CL 107 11/21/2023 0954   CO2 23 11/21/2023 0954   GLUCOSE 89 11/21/2023 0954   BUN 21 11/21/2023 0954   CREATININE 0.80 11/21/2023 0954   CALCIUM 10.3 11/21/2023 0954   GFRNONAA >60 11/21/2023 0954   CrCl cannot be calculated (Unknown ideal weight.).  COAG No results found for: "INR", "PROTIME"  Radiology No results found.   Assessment/Plan 1. Aneurysm of ascending aorta without rupture (HCC) (Primary) Recommend:  No surgery or intervention is indicated at  this time.  The patient has an asymptomatic thoracic aortic aneurysm that is less than 6.0 cm in maximal diameter.  I have discussed the natural history of thoracic aortic aneurysm and the small risk of rupture for aneurysm less than 6.5 cm in size.  However, as these small aneurysms tend to enlarge over time, continued surveillance with CT scan is mandatory.   I have also discussed optimizing medical management with hypertension and lipid control and the negative effect that any tobacco products have on aneurysmal disease.  The patient is also encouraged to exercise a minimum of 30 minutes 4 times  a week.   Should the patient develop new onset chest or back pain or signs of peripheral embolization they are instructed to seek medical attention immediately and to alert the physician providing care that they have an aneurysm in the chest.   I will also obtain a duplex ultrasound of the abdominal aorta given her thoracic aneurysm to ensure that there is not infrarenal component.  Popliteal pulses are normal in size no indication for popliteal artery aneurysm.  The patient voices their understanding.  The patient will return as ordered with a CT scan of the chest - VAS US AORTA/IVC/ILIACS; Future - CT CHEST WO CONTRAST; Future  2. Essential hypertension Continue antihypertensive medications as already ordered, these medications have been reviewed and there are no changes at this time.  3. Mixed hyperlipidemia Continue statin as ordered and reviewed, no changes at this time  4. Arthralgia of both lower legs Continue medications to treat the patient's degenerative disease as already ordered, these medications have been reviewed and there are no changes at this time.  Continued activity and therapy was stressed.    Levora Dredge, MD  12/08/2023 4:12 PM

## 2023-12-09 ENCOUNTER — Encounter (INDEPENDENT_AMBULATORY_CARE_PROVIDER_SITE_OTHER): Payer: Self-pay | Admitting: Vascular Surgery

## 2023-12-09 ENCOUNTER — Ambulatory Visit (INDEPENDENT_AMBULATORY_CARE_PROVIDER_SITE_OTHER): Payer: Self-pay | Admitting: Vascular Surgery

## 2023-12-09 VITALS — BP 135/82 | HR 74 | Resp 18 | Ht 67.0 in | Wt 176.0 lb

## 2023-12-09 DIAGNOSIS — M25562 Pain in left knee: Secondary | ICD-10-CM

## 2023-12-09 DIAGNOSIS — M25561 Pain in right knee: Secondary | ICD-10-CM

## 2023-12-09 DIAGNOSIS — I10 Essential (primary) hypertension: Secondary | ICD-10-CM

## 2023-12-09 DIAGNOSIS — E785 Hyperlipidemia, unspecified: Secondary | ICD-10-CM | POA: Insufficient documentation

## 2023-12-09 DIAGNOSIS — E782 Mixed hyperlipidemia: Secondary | ICD-10-CM

## 2023-12-09 DIAGNOSIS — M25569 Pain in unspecified knee: Secondary | ICD-10-CM | POA: Insufficient documentation

## 2023-12-09 DIAGNOSIS — I7121 Aneurysm of the ascending aorta, without rupture: Secondary | ICD-10-CM

## 2023-12-10 ENCOUNTER — Ambulatory Visit: Payer: No Typology Code available for payment source | Admitting: Cardiology

## 2024-01-03 ENCOUNTER — Telehealth (INDEPENDENT_AMBULATORY_CARE_PROVIDER_SITE_OTHER): Payer: Self-pay | Admitting: Vascular Surgery

## 2024-01-03 NOTE — Telephone Encounter (Signed)
 LVM for pt to call Radiology scheduling at [phone] and schedule CT ordered by Dr. Prescilla Brod. Once that is scheduled, I advised to call us  back at the office to get an appt scheduled with Dr. Prescilla Brod to get the results.

## 2024-01-15 ENCOUNTER — Telehealth (INDEPENDENT_AMBULATORY_CARE_PROVIDER_SITE_OTHER): Payer: Self-pay

## 2024-01-15 NOTE — Telephone Encounter (Signed)
 Nils Baseman called from [facility] called stating that Kim Ruiz is scheduled for a CT chest without contrast on 01/16/24. On [employer] website it is labeled as other place of service and need to changed to on campus.   Please advise- Per Wauneta Haddock

## 2024-01-16 ENCOUNTER — Ambulatory Visit
Admission: RE | Admit: 2024-01-16 | Discharge: 2024-01-16 | Disposition: A | Discharge status: Home / Self Care | Source: Ambulatory Visit | Attending: Vascular Surgery | Admitting: Vascular Surgery

## 2024-01-16 DIAGNOSIS — I7121 Aneurysm of the ascending aorta, without rupture: Secondary | ICD-10-CM | POA: Insufficient documentation

## 2024-01-21 ENCOUNTER — Encounter (INDEPENDENT_AMBULATORY_CARE_PROVIDER_SITE_OTHER): Payer: Self-pay

## 2024-02-02 DIAGNOSIS — T8859XA Other complications of anesthesia, initial encounter: Secondary | ICD-10-CM

## 2024-02-02 HISTORY — DX: Other complications of anesthesia, initial encounter: T88.59XA

## 2024-02-03 ENCOUNTER — Encounter: Payer: Self-pay | Admitting: *Deleted

## 2024-02-04 ENCOUNTER — Encounter: Payer: Self-pay | Admitting: Cardiology

## 2024-02-04 ENCOUNTER — Ambulatory Visit: Attending: Cardiology | Admitting: Cardiology

## 2024-02-04 VITALS — BP 158/84 | HR 61 | Ht 66.0 in | Wt 174.6 lb

## 2024-02-04 DIAGNOSIS — I10 Essential (primary) hypertension: Secondary | ICD-10-CM | POA: Diagnosis not present

## 2024-02-04 DIAGNOSIS — R011 Cardiac murmur, unspecified: Secondary | ICD-10-CM | POA: Diagnosis not present

## 2024-02-04 MED ORDER — LOSARTAN POTASSIUM 50 MG PO TABS: 50.0000 mg | ORAL_TABLET | Freq: Every day | ORAL | 3 refills | Status: AC

## 2024-02-04 NOTE — Patient Instructions (Signed)
 Medication Instructions:  -restart losartan 50 mg   *If you need a refill on your cardiac medications before your next appointment, please call your pharmacy*  Lab Work: No labs ordered today  If you have labs (blood work) drawn today and your tests are completely normal, you will receive your results only by: MyChart Message (if you have MyChart) OR A paper copy in the mail If you have any lab test that is abnormal or we need to change your treatment, we will call you to review the results.  Testing/Procedures: Your physician has requested that you have an echocardiogram. Echocardiography is a painless test that uses sound waves to create images of your heart. It provides your doctor with information about the size and shape of your heart and how well your heart's chambers and valves are working.   You may receive an ultrasound enhancing agent through an IV if needed to better visualize your heart during the echo. This procedure takes approximately one hour.  There are no restrictions for this procedure.  This will take place at [address] [facility] [address] [facility]) [address], [place] [address]  Please note: We ask at that you not bring children with you during ultrasound (echo/ vascular) testing. Due to room size and safety concerns, children are not allowed in the ultrasound rooms during exams. Our front office staff cannot provide observation of children in our lobby area while testing is being conducted. An adult accompanying a patient to their appointment will only be allowed in the ultrasound room at the discretion of the ultrasound technician under special circumstances. We apologize for any inconvenience.   Follow-Up: At [facility], you and your health needs are our priority.  As part of our continuing mission to provide you with exceptional heart care, our providers are all part of one team.  This team includes your primary Cardiologist (physician) and Advanced Practice  Providers or APPs (Physician Assistants and Nurse Practitioners) who all work together to provide you with the care you need, when you need it.  Your next appointment:   3 month(s)  Provider:   You may see Dr. Junnie Olives or one of the following Advanced Practice Providers on your designated Care Team:   Laneta Pintos, NP Gildardo Labrador, PA-C Varney Gentleman, PA-C Cadence [place], PA-C Ronald Cockayne, NP Morey Ar, NP    We recommend signing up for the patient portal called "MyChart".  Sign up information is provided on this After Visit Summary.  MyChart is used to connect with patients for Virtual Visits (Telemedicine).  Patients are able to view lab/test results, encounter notes, upcoming appointments, etc.  Non-urgent messages can be sent to your provider as well.   To learn more about what you can do with MyChart, go to [email or web address].

## 2024-02-04 NOTE — Progress Notes (Signed)
 Cardiology Office Note:    Date:  02/04/2024   ID:  Kim Ruiz, DOB 1951-05-30, MRN [id number]  PCP:  Macie Saxon, MD   [facility] Providers Cardiologist:  None     Referring MD: Evelena Hines, FNP   Chief Complaint  Patient presents with   New Patient (Initial Visit)    New patient establish care ,heart murmur, medication reviewed verbally with patient     History of Present Illness:    Kim Ruiz is a 73 y.o. female with a hx of HTN, breast cancer who presents due to systolic murmur and elevated BP.  Recently seen by [facility] cardiology for preop evaluation.  Left hip surgery is being considered.  She denies chest pain.  Does not check her BP at home frequently.  Currently takes amlodipine 10 mg daily for BP.  Endorses eating healthier, less processed foods.  Has significant left hip pain, also has left knee pain.  Has appointment with orthopedic surgery today.  Stress echo at [facility] cards 06/2023 EF >55%, no inducible ischemia, no significant valvular abnormalities  Past Medical History:  Diagnosis Date   Arthritis    Dysrhythmia    Heart murmur    Hypertension    Personal history of radiation therapy     Past Surgical History:  Procedure Laterality Date   ABDOMINAL HYSTERECTOMY     BREAST BIOPSY Right 03/2013   BREAST BIOPSY Right 05/20/2023   MM RT BREAST BX W LOC DEV 1ST LESION IMAGE BX SPEC STEREO GUIDE 05/20/2023 [facility]   BREAST BIOPSY Right 05/30/2023   MM RT RADIO FREQUENCY TAG LOC MAMMO GUIDE 05/30/2023 [facility]   BREAST LUMPECTOMY Right 05/31/2023   BREAST LUMPECTOMY,RADIO FREQ LOCALIZER,AXILLARY SENTINEL LYMPH NODE BIOPSY Right 05/30/2023   savi placement   CARPAL TUNNEL RELEASE Right    COLONOSCOPY     COLONOSCOPY WITH PROPOFOL  N/A 03/01/2015   Procedure: COLONOSCOPY WITH PROPOFOL ;  Surgeon: Marnee Sink, MD;  Location: [facility] ENDOSCOPY;  Service: Endoscopy;  Laterality: N/A;   KNEE ARTHROSCOPY Right    KNEE  ARTHROSCOPY WITH MEDIAL MENISECTOMY Right 02/01/2015   Procedure: KNEE ARTHROSCOPY WITH MEDIAL MENISECTOMY;  Surgeon: Daris Edman, MD;  Location: [facility] ORS;  Service: Orthopedics;  Laterality: Right;   PART MASTECTOMY,RADIO FREQUENCY LOCALIZER,AXILLARY SENTINEL NODE BIOPSY Right 05/31/2023   Procedure: PARTIAL MASTECTOMY,RADIO FREQUENCY LOCALIZER,AXILLARY SENTINEL NODE BIOPSY;  Surgeon: Eldred Grego, MD;  Location: [facility] ORS;  Service: General;  Laterality: Right;    Current Medications: Current Meds  Medication Sig   amLODipine (NORVASC) 10 MG tablet Take 10 mg by mouth daily.   anastrozole  (ARIMIDEX ) 1 MG tablet Take 1 tablet (1 mg total) by mouth daily.   atorvastatin (LIPITOR) 20 MG tablet Take 20 mg by mouth daily.   Calcium  Carb-Cholecalciferol  (CALCIUM  600+D) 600-20 MG-MCG TABS Take 1,200 mg by mouth daily.   cyclobenzaprine  (FLEXERIL ) 10 MG tablet Take 10 mg by mouth at bedtime as needed.   gabapentin (NEURONTIN) 100 MG capsule Take 100 mg by mouth as needed.   ibuprofen (ADVIL) 800 MG tablet Take 800 mg by mouth every 8 (eight) hours as needed.   lidocaine  (LIDODERM ) 5 % SMARTSIG:Topical   [DISCONTINUED] hydrochlorothiazide (HYDRODIURIL) 25 MG tablet Take 25 mg by mouth daily.   [DISCONTINUED] losartan (COZAAR) 50 MG tablet Take 50 mg by mouth daily.   [DISCONTINUED] Potassium Chloride ER 20 MEQ TBCR Take 1 tablet by mouth daily.     Allergies:   Patient has no known allergies.   Social  History   Socioeconomic History   Marital status: Single    Spouse name: Not on file   Number of children: Not on file   Years of education: Not on file   Highest education level: Not on file  Occupational History   Not on file  Tobacco Use   Smoking status: Former    Current packs/day: 0.00    Average packs/day: 0.3 packs/day for 13.0 years (3.3 ttl pk-yrs)    Types: Cigarettes    Start date: 5    Quit date: 31    Years since quitting: 37.4   Smokeless tobacco: Not  on file  Vaping Use   Vaping status: Never Used  Substance and Sexual Activity   Alcohol use: No   Drug use: No   Sexual activity: Yes    Birth control/protection: Post-menopausal  Other Topics Concern   Not on file  Social History Narrative   ** Merged History Encounter **  lives with granddaughter   Social Drivers of Health   Financial Resource Strain: Not on file  Food Insecurity: No Food Insecurity (05/24/2023)   Hunger Vital Sign    Worried About Running Out of Food in the Last Year: Never true    Ran Out of Food in the Last Year: Never true  Transportation Needs: No Transportation Needs (05/24/2023)   PRAPARE - Administrator, Civil Service (Medical): No    Lack of Transportation (Non-Medical): No  Physical Activity: Not on file  Stress: Not on file  Social Connections: Not on file     Family History: The patient's family history includes Breast cancer in an other family member; Breast cancer (age of onset: 42) in her sister; Heart attack in her mother; Multiple myeloma in her brother.  ROS:   Please see the history of present illness.     All other systems reviewed and are negative.  EKGs/Labs/Other Studies Reviewed:    The following studies were reviewed today:  EKG Interpretation Date/Time:  Tuesday February 04 2024 14:22:14 EDT Ventricular Rate:  61 PR Interval:  162 QRS Duration:  78 QT Interval:  424 QTC Calculation: 426 R Axis:   0  Text Interpretation: Normal sinus rhythm Possible Left atrial enlargement Minimal voltage criteria for LVH, may be normal variant ( R in aVL ) Confirmed by Constancia Delton ([id number]) on 02/04/2024 2:30:46 PM    Recent Labs: 11/21/2023: ALT 18; BUN 21; Creatinine 0.80; Hemoglobin 12.5; Platelet Count 250; Potassium 3.3; Sodium 139  Recent Lipid Panel No results found for: "CHOL", "TRIG", "HDL", "CHOLHDL", "VLDL", "LDLCALC", "LDLDIRECT"   Risk Assessment/Calculations:     Physical Exam:    VS:  BP (!) 158/84 (BP  Location: Left Arm, Patient Position: Sitting, Cuff Size: Normal)   Pulse 61   Ht 5\' 6"  (1.676 m)   Wt 174 lb 9.6 oz (79.2 kg)   SpO2 97%   BMI 28.18 kg/m     Wt Readings from Last 3 Encounters:  02/04/24 174 lb 9.6 oz (79.2 kg)  12/09/23 176 lb (79.8 kg)  11/21/23 176 lb 3.2 oz (79.9 kg)     GEN:  Well nourished, well developed in no acute distress HEENT: Normal NECK: No JVD; No carotid bruits CARDIAC: RRR, 2/6 systolic murmur RESPIRATORY:  Clear to auscultation without rales, wheezing or rhonchi  ABDOMEN: Soft, non-tender, non-distended MUSCULOSKELETAL:  No edema; No deformity  SKIN: Warm and dry NEUROLOGIC:  Alert and oriented x 3 PSYCHIATRIC:  Normal affect   ASSESSMENT:  1. Primary hypertension   2. Systolic murmur    PLAN:    In order of problems listed above:  Hypertension, BP elevated.  Start losartan 50 mg daily, continue Norvasc 10 mg daily. Systolic murmur, repeat echo to evaluate any significant valvular abnormalities.  Follow-up after echo.  Patient wants to follow-up with [place] [facility].      Medication Adjustments/Labs and Tests Ordered: Current medicines are reviewed at length with the patient today.  Concerns regarding medicines are outlined above.  Orders Placed This Encounter  Procedures   EKG 12-Lead   ECHOCARDIOGRAM COMPLETE   Meds ordered this encounter  Medications   losartan (COZAAR) 50 MG tablet    Sig: Take 1 tablet (50 mg total) by mouth daily.    Dispense:  60 tablet    Refill:  3    Patient Instructions  Medication Instructions:  -restart losartan 50 mg   *If you need a refill on your cardiac medications before your next appointment, please call your pharmacy*  Lab Work: No labs ordered today  If you have labs (blood work) drawn today and your tests are completely normal, you will receive your results only by: MyChart Message (if you have MyChart) OR A paper copy in the mail If you have any lab test that is  abnormal or we need to change your treatment, we will call you to review the results.  Testing/Procedures: Your physician has requested that you have an echocardiogram. Echocardiography is a painless test that uses sound waves to create images of your heart. It provides your doctor with information about the size and shape of your heart and how well your heart's chambers and valves are working.   You may receive an ultrasound enhancing agent through an IV if needed to better visualize your heart during the echo. This procedure takes approximately one hour.  There are no restrictions for this procedure.  This will take place at [address] [facility] [address] [facility]) [address], [place] [address]  Please note: We ask at that you not bring children with you during ultrasound (echo/ vascular) testing. Due to room size and safety concerns, children are not allowed in the ultrasound rooms during exams. Our front office staff cannot provide observation of children in our lobby area while testing is being conducted. An adult accompanying a patient to their appointment will only be allowed in the ultrasound room at the discretion of the ultrasound technician under special circumstances. We apologize for any inconvenience.   Follow-Up: At [facility], you and your health needs are our priority.  As part of our continuing mission to provide you with exceptional heart care, our providers are all part of one team.  This team includes your primary Cardiologist (physician) and Advanced Practice Providers or APPs (Physician Assistants and Nurse Practitioners) who all work together to provide you with the care you need, when you need it.  Your next appointment:   3 month(s)  Provider:   You may see Dr. Junnie Olives or one of the following Advanced Practice Providers on your designated Care Team:   Laneta Pintos, NP Gildardo Labrador, PA-C Varney Gentleman, PA-C Cadence [place], PA-C Ronald Cockayne, NP Morey Ar, NP    We recommend signing up for the patient portal called "MyChart".  Sign up information is provided on this After Visit Summary.  MyChart is used to connect with patients for Virtual Visits (Telemedicine).  Patients are able to view lab/test results, encounter notes, upcoming appointments, etc.  Non-urgent messages can be sent to your provider as well.   To learn more about what you can do with MyChart, go to [email or web address].         Signed, Constancia Delton, MD  02/04/2024 3:31 PM     HeartCare

## 2024-02-05 NOTE — Progress Notes (Signed)
 MRN : [id number]  Kim Ruiz is a 73 y.o. (27-Jun-1951) female who presents with chief complaint of check circulation.  History of Present Illness:   The patient presents to the office for evaluation of an ascending thoracic aortic aneurysm. The aneurysm was found incidentally by CT scan. Patient denies chest pain or unusual back pain, no other chest or abdominal complaints.  No history of an abrupt onset of a painful toe associated with blue discoloration.      No family history of TAA/AAA.    Patient denies amaurosis fugax or TIA symptoms.  There is no history of claudication or rest pain symptoms of the lower extremities.   The patient denies angina or shortness of breath.   CT scan dated 01/16/2024 is reviewed by me and shows an ascending TAA that measures 4.2 cm, the descending component is 3.0 cm  Duplex ultrasound of the abdominal aorta and iliac systems obtained today demonstrates the aorta measures 2.7 cm enlarged.  Stable when compared to previous study.  The left common iliac artery measures 16 mm in diameter and the right common iliac artery measures 15 mm in diameter again enlarged.  No outpatient medications have been marked as taking for the 02/06/24 encounter (Appointment) with Prescilla Brod, Ninette Basque, MD.    Past Medical History:  Diagnosis Date   Arthritis    Dysrhythmia    Heart murmur    Hypertension    Personal history of radiation therapy     Past Surgical History:  Procedure Laterality Date   ABDOMINAL HYSTERECTOMY     BREAST BIOPSY Right 03/2013   BREAST BIOPSY Right 05/20/2023   MM RT BREAST BX W LOC DEV 1ST LESION IMAGE BX SPEC STEREO GUIDE 05/20/2023 [facility]   BREAST BIOPSY Right 05/30/2023   MM RT RADIO FREQUENCY TAG LOC MAMMO GUIDE 05/30/2023 [facility]   BREAST LUMPECTOMY Right 05/31/2023   BREAST LUMPECTOMY,RADIO FREQ LOCALIZER,AXILLARY SENTINEL LYMPH NODE BIOPSY  Right 05/30/2023   savi placement   CARPAL TUNNEL RELEASE Right    COLONOSCOPY     COLONOSCOPY WITH PROPOFOL  N/A 03/01/2015   Procedure: COLONOSCOPY WITH PROPOFOL ;  Surgeon: Marnee Sink, MD;  Location: [facility];  Service: Endoscopy;  Laterality: N/A;   KNEE ARTHROSCOPY Right    KNEE ARTHROSCOPY WITH MEDIAL MENISECTOMY Right 02/01/2015   Procedure: KNEE ARTHROSCOPY WITH MEDIAL MENISECTOMY;  Surgeon: Daris Edman, MD;  Location: [facility];  Service: Orthopedics;  Laterality: Right;   PART MASTECTOMY,RADIO FREQUENCY LOCALIZER,AXILLARY SENTINEL NODE BIOPSY Right 05/31/2023   Procedure: PARTIAL MASTECTOMY,RADIO FREQUENCY LOCALIZER,AXILLARY SENTINEL NODE BIOPSY;  Surgeon: Eldred Grego, MD;  Location: [facility];  Service: General;  Laterality: Right;    Social History Social History   Tobacco Use   Smoking status: Former    Current packs/day: 0.00    Average packs/day: 0.3 packs/day for 13.0 years (3.3 ttl pk-yrs)    Types: Cigarettes    Start date: 57    Quit date: 1988    Years since quitting: 37.4  Vaping Use   Vaping status: Never Used  Substance Use Topics   Alcohol use: No  Drug use: No    Family History Family History  Problem Relation Age of Onset   Heart attack Mother    Breast cancer Sister 24   Multiple myeloma Brother    Breast cancer Other        40's    No Known Allergies   REVIEW OF SYSTEMS (Negative unless checked)  Constitutional: [] Weight loss  [] Fever  [] Chills Cardiac: [] Chest pain   [] Chest pressure   [] Palpitations   [] Shortness of breath when laying flat   [] Shortness of breath with exertion. Vascular:  [x] Pain in legs with walking   [] Pain in legs at rest  [] History of DVT   [] Phlebitis   [] Swelling in legs   [] Varicose veins   [] Non-healing ulcers Pulmonary:   [] Uses home oxygen   [] Productive cough   [] Hemoptysis   [] Wheeze  [] COPD   [] Asthma Neurologic:  [] Dizziness   [] Seizures   [] History of stroke   [] History of TIA   [] Aphasia   [] Vissual changes   [] Weakness or numbness in arm   [] Weakness or numbness in leg Musculoskeletal:   [] Joint swelling   [x] Joint pain   [] Low back pain Hematologic:  [] Easy bruising  [] Easy bleeding   [] Hypercoagulable state   [] Anemic Gastrointestinal:  [] Diarrhea   [] Vomiting  [] Gastroesophageal reflux/heartburn   [] Difficulty swallowing. Genitourinary:  [] Chronic kidney disease   [] Difficult urination  [] Frequent urination   [] Blood in urine Skin:  [] Rashes   [] Ulcers  Psychological:  [] History of anxiety   []  History of major depression.  Physical Examination  There were no vitals filed for this visit. There is no height or weight on file to calculate BMI. Gen: WD/WN, NAD Head: [place]/AT, No temporalis wasting.  Ear/Nose/Throat: Hearing grossly intact, nares w/o erythema or drainage Eyes: PER, EOMI, sclera nonicteric.  Neck: Supple, no masses.  No bruit or JVD.  Pulmonary:  Good air movement, no audible wheezing, no use of accessory muscles.  Cardiac: RRR, normal S1, S2, no Murmurs. Vascular:  mild trophic changes, no open wounds Vessel Right Left  Radial Palpable Palpable  Gastrointestinal: soft, non-distended. No guarding/no peritoneal signs.  Musculoskeletal: M/S 5/5 throughout.  No visible deformity.  Neurologic: CN 2-12 intact. Pain and light touch intact in extremities.  Symmetrical.  Speech is fluent. Motor exam as listed above. Psychiatric: Judgment intact, Mood & affect appropriate for pt's clinical situation. Dermatologic: No rashes or ulcers noted.  No changes consistent with cellulitis.   CBC Lab Results  Component Value Date   WBC 5.9 11/21/2023   HGB 12.5 11/21/2023   HCT 40.5 11/21/2023   MCV 81.3 11/21/2023   PLT 250 11/21/2023    BMET    Component Value Date/Time   NA 139 11/21/2023 0954   K 3.3 (L) 11/21/2023 0954   CL 107 11/21/2023 0954   CO2 23 11/21/2023 0954   GLUCOSE 89 11/21/2023 0954   BUN 21 11/21/2023 0954   CREATININE 0.80  11/21/2023 0954   CALCIUM  10.3 11/21/2023 0954   GFRNONAA >60 11/21/2023 0954   CrCl cannot be calculated (Patient's most recent lab result is older than the maximum 21 days allowed.).  COAG No results found for: "INR", "PROTIME"  Radiology CT CHEST WO CONTRAST Result Date: 01/29/2024 CLINICAL DATA:  Ascending aortic aneurysm follow-up, found incidentally on another CT. No referable symptoms. EXAM: CT CHEST WITHOUT CONTRAST TECHNIQUE: Multidetector CT imaging of the chest was performed following the standard protocol without IV contrast. RADIATION DOSE REDUCTION: This exam was performed according to  the departmental dose-optimization program which includes automated exposure control, adjustment of the mA and/or kV according to patient size and/or use of iterative reconstruction technique. COMPARISON:  CTA chest 08/26/2023. FINDINGS: Cardiovascular: The pulmonary arteries and veins are normal in caliber. The heart is slightly enlarged. There is no pericardial effusion. There are left main and three-vessel coronary artery calcifications, greatest in the LAD and circumflex arteries. Mild scattered calcific plaque noted aorta and great vessels, aortic tortuosity. Measurements are as follows: Valve: 2.3 cm on 5:55; Sinuses: 3.5 cm on 5:57; Sinotubular junction: 3.1 cm on 5:57; Mid ascending aorta: 4.0 cm on 5:59, 4.2 cm AP on 6:93. Previously 4.1 x 4.1 cm; Mid arch: 3.5 cm on 5:73; Isthmus: 3.1 cm on 6:109; Proximal descending segment: 3.0 cm on 6:110; Distal descending segment: 2.5 cm just above the hiatus on 6:111. Mediastinum/Nodes: No enlarged mediastinal or axillary lymph nodes. The the hila are not well evaluated without contrast but no new contour deforming abnormality is seen. Thyroid gland, trachea, and esophagus demonstrate no significant findings. Lungs/Pleura: There are symmetric biapical scarring changes. Stable 4 mm and 3 mm right middle lobe nodules on 4:62 and 63, respectively. There are no new  or further nodules and no consolidation, effusion or pneumothorax. Upper Abdomen: No acute upper abdominal findings. Abdominal aortic atherosclerosis. There is PACS T a ptosis. Musculoskeletal: Mild thoracic kyphosis and degenerative change. Osteopenia. No acute or other significant osseous findings. A surgical clip again noted in the medial right breast, asymmetric overlying skin thickening. Postsurgical change right axilla. There is a thick-walled fluid collection in outer right breast almost certainly postsurgical, today is 3.5 x 2.8 cm, was previously 3.9 x 3.0 cm. This is probably a contracting seroma or hematoma. IMPRESSION: 1. Stable 4.2 cm mid ascending aortic aneurysm. Recommend annual imaging follow-up by CTA or MRA. This recommendation follows 2010 ACCF/AHA/AATS/ACR/ASA/SCA/SCAI/SIR/STS/ SVM Guidelines for the Diagnosis and Management of Patients with Thoracic Aortic Disease. Circulation. 2010; 121: W098-J191. Aortic aneurysm NOS (ICD10-I71.9). 2. Aortic and coronary artery atherosclerosis. 3. Stable 4 mm and 3 mm right middle lobe nodules. The usual Fleischner criteria do not apply to cancer patients. Attention on follow-up for the aortic aneurysm recommended. 4. Postsurgical changes in the right breast and axilla with a thick-walled fluid collection in the outer right breast, probably a contracting seroma or hematoma, slightly smaller than previously. 5. Osteopenia, thoracic kyphosis and degenerative change. Aortic Atherosclerosis (ICD10-I70.0). Electronically Signed   By: Denman Fischer M.D.   On: 01/29/2024 23:47     Assessment/Plan 1. Aneurysm of ascending aorta without rupture (HCC) (Primary) Recommend:  No surgery or intervention is indicated at this time.  The patient has an asymptomatic thoracic aortic aneurysm that is less than 6.0 cm in maximal diameter.  I have discussed the natural history of thoracic aortic aneurysm and the small risk of rupture for aneurysm less than 6.5 cm in  size.  However, as these small aneurysms tend to enlarge over time, continued surveillance with CT scan is mandatory.   I have also discussed optimizing medical management with hypertension and lipid control and the negative effect that any tobacco products have on aneurysmal disease.  The patient is also encouraged to exercise a minimum of 30 minutes 4 times a week.   Should the patient develop new onset chest or back pain or signs of peripheral embolization they are instructed to seek medical attention immediately and to alert the physician providing care that they have an aneurysm in the chest.   The  patient voices their understanding.  The patient will return as ordered with a CT scan of the chest  2. Infrarenal abdominal aortic aneurysm (AAA) without rupture (HCC) Recommend: No surgery or intervention is indicated at this time.  The patient has an asymptomatic abdominal aortic aneurysm that is less than 4 cm in maximal diameter.    I have reviewed the natural history of abdominal aortic aneurysm and the small risk of rupture for aneurysm less than 5 cm in size.  However, as these small aneurysms tend to enlarge over time, continued surveillance with ultrasound or CT scan is mandatory.   I have also discussed optimizing medical management with hypertension and lipid control and the negative effect that any tobacco products have on aneurysmal disease.  The patient is also encouraged to exercise a minimum of 30 minutes 4 times a week.   Should the patient develop new onset abdominal or back pain or signs of peripheral embolization they are instructed to seek medical attention immediately and to alert the physician providing care that they have an aneurysm.   The patient voices their understanding.  The patient will return in 12 months with an aortic duplex.  3. Superficial venous thrombosis of arm, right The patient developed superficial thrombophlebitis of the upper extremity during  treatment for her breast cancer.  This would not indicate a hypercoagulable situation that would support placement of an IVC filter prior to nonvascular surgery.  We will continue to observe.  No IVC filter at this time.  4. Essential hypertension Continue antihypertensive medications as already ordered, these medications have been reviewed and there are no changes at this time.  5. Mixed hyperlipidemia Continue statin as ordered and reviewed, no changes at this time    Devon Fogo, MD  02/05/2024 10:30 AM

## 2024-02-06 ENCOUNTER — Ambulatory Visit (INDEPENDENT_AMBULATORY_CARE_PROVIDER_SITE_OTHER): Payer: Self-pay | Admitting: Vascular Surgery

## 2024-02-06 ENCOUNTER — Encounter (INDEPENDENT_AMBULATORY_CARE_PROVIDER_SITE_OTHER): Payer: Self-pay | Admitting: Vascular Surgery

## 2024-02-06 ENCOUNTER — Ambulatory Visit (INDEPENDENT_AMBULATORY_CARE_PROVIDER_SITE_OTHER): Payer: Self-pay

## 2024-02-06 VITALS — BP 168/80 | HR 50 | Resp 18

## 2024-02-06 DIAGNOSIS — E782 Mixed hyperlipidemia: Secondary | ICD-10-CM

## 2024-02-06 DIAGNOSIS — I10 Essential (primary) hypertension: Secondary | ICD-10-CM

## 2024-02-06 DIAGNOSIS — I82611 Acute embolism and thrombosis of superficial veins of right upper extremity: Secondary | ICD-10-CM | POA: Diagnosis not present

## 2024-02-06 DIAGNOSIS — I7143 Infrarenal abdominal aortic aneurysm, without rupture: Secondary | ICD-10-CM | POA: Diagnosis not present

## 2024-02-06 DIAGNOSIS — I7121 Aneurysm of the ascending aorta, without rupture: Secondary | ICD-10-CM | POA: Diagnosis not present

## 2024-02-09 DIAGNOSIS — I714 Abdominal aortic aneurysm, without rupture, unspecified: Secondary | ICD-10-CM | POA: Insufficient documentation

## 2024-02-11 ENCOUNTER — Other Ambulatory Visit: Payer: Self-pay | Admitting: Orthopedic Surgery

## 2024-02-18 ENCOUNTER — Encounter
Admission: RE | Admit: 2024-02-18 | Discharge: 2024-02-18 | Disposition: A | Discharge status: Home / Self Care | Source: Ambulatory Visit | Attending: Orthopedic Surgery | Admitting: Orthopedic Surgery

## 2024-02-18 ENCOUNTER — Other Ambulatory Visit: Payer: Self-pay

## 2024-02-18 ENCOUNTER — Encounter: Payer: Self-pay | Admitting: Orthopedic Surgery

## 2024-02-18 VITALS — BP 169/83 | Temp 98.1°F | Resp 14 | Ht 67.0 in | Wt 173.0 lb

## 2024-02-18 DIAGNOSIS — Z79811 Long term (current) use of aromatase inhibitors: Secondary | ICD-10-CM | POA: Diagnosis not present

## 2024-02-18 DIAGNOSIS — Z01818 Encounter for other preprocedural examination: Secondary | ICD-10-CM | POA: Diagnosis present

## 2024-02-18 DIAGNOSIS — M8580 Other specified disorders of bone density and structure, unspecified site: Secondary | ICD-10-CM | POA: Insufficient documentation

## 2024-02-18 DIAGNOSIS — Z01812 Encounter for preprocedural laboratory examination: Secondary | ICD-10-CM | POA: Insufficient documentation

## 2024-02-18 HISTORY — DX: Other specified disorders of bone density and structure, unspecified site: M85.80

## 2024-02-18 HISTORY — DX: Mixed hyperlipidemia: E78.2

## 2024-02-18 HISTORY — DX: Bilateral primary osteoarthritis of hip: M16.0

## 2024-02-18 HISTORY — DX: Essential (primary) hypertension: I10

## 2024-02-18 HISTORY — DX: Aneurysm of the ascending aorta, without rupture: I71.21

## 2024-02-18 HISTORY — DX: Acute embolism and thrombosis of superficial veins of right upper extremity: I82.611

## 2024-02-18 LAB — URINALYSIS, ROUTINE W REFLEX MICROSCOPIC
Bilirubin Urine: NEGATIVE
Glucose, UA: NEGATIVE mg/dL
Ketones, ur: NEGATIVE mg/dL
Nitrite: NEGATIVE
Protein, ur: NEGATIVE mg/dL
Specific Gravity, Urine: 1.014 (ref 1.005–1.030)
pH: 6 (ref 5.0–8.0)

## 2024-02-18 LAB — COMPREHENSIVE METABOLIC PANEL WITH GFR
ALT: 20 U/L (ref 0–44)
AST: 20 U/L (ref 15–41)
Albumin: 4.2 g/dL (ref 3.5–5.0)
Alkaline Phosphatase: 107 U/L (ref 38–126)
Anion gap: 7 (ref 5–15)
BUN: 14 mg/dL (ref 8–23)
CO2: 27 mmol/L (ref 22–32)
Calcium: 11.1 mg/dL — ABNORMAL HIGH (ref 8.9–10.3)
Chloride: 109 mmol/L (ref 98–111)
Creatinine, Ser: 0.84 mg/dL (ref 0.44–1.00)
GFR, Estimated: >60 mL/min (ref >60)
Glucose, Bld: 87 mg/dL (ref 70–99)
Potassium: 3.7 mmol/L (ref 3.5–5.1)
Sodium: 143 mmol/L (ref 135–145)
Total Bilirubin: 0.8 mg/dL (ref 0.0–1.2)
Total Protein: 7.4 g/dL (ref 6.5–8.1)

## 2024-02-18 LAB — CBC WITH DIFFERENTIAL/PLATELET
Abs Immature Granulocytes: 0.03 10*3/uL (ref 0.00–0.07)
Basophils Absolute: 0.1 10*3/uL (ref 0.0–0.1)
Basophils Relative: 1 %
Eosinophils Absolute: 0.1 10*3/uL (ref 0.0–0.5)
Eosinophils Relative: 1 %
HCT: 41.3 % (ref 36.0–46.0)
Hemoglobin: 13.1 g/dL (ref 12.0–15.0)
Immature Granulocytes: 0 %
Lymphocytes Relative: 32 %
Lymphs Abs: 2.5 10*3/uL (ref 0.7–4.0)
MCH: 25.3 pg — ABNORMAL LOW (ref 26.0–34.0)
MCHC: 31.7 g/dL (ref 30.0–36.0)
MCV: 79.9 fL — ABNORMAL LOW (ref 80.0–100.0)
Monocytes Absolute: 0.5 10*3/uL (ref 0.1–1.0)
Monocytes Relative: 7 %
Neutro Abs: 4.7 10*3/uL (ref 1.7–7.7)
Neutrophils Relative %: 59 %
Platelets: 218 10*3/uL (ref 150–400)
RBC: 5.17 MIL/uL — ABNORMAL HIGH (ref 3.87–5.11)
RDW: 13.9 % (ref 11.5–15.5)
WBC: 8 10*3/uL (ref 4.0–10.5)
nRBC: 0 % (ref 0.0–0.2)

## 2024-02-18 LAB — SURGICAL PCR SCREEN
MRSA, PCR: NEGATIVE
Staphylococcus aureus: NEGATIVE

## 2024-02-18 NOTE — Progress Notes (Addendum)
  [facility] Perioperative Services: Pre-Admission/Anesthesia Testing  Abnormal Lab Notification   Date: 02/18/24  Name: Kim Ruiz MRN:   [id number]  Re: Abnormal labs noted during PAT appointment   Notified:    Provider Name Provider Role Notification Mode  Venus Ginsberg, MD Orthopedics (Surgeon) Routed and/or faxed via Jerre Moots, MD Medical Oncology Routed and/or faxed via [facility]   ABNORMAL LAB VALUE(S):   Lab Results  Component Value Date   CALCIUM  11.1 (H) 02/18/2024   Clinical Information and Notes:  Kim Ruiz is scheduled for a ARTHROPLASTY, HIP, TOTAL, ANTERIOR APPROACH (Left: Hip) on 02/20/2024.  In review of her preoperative labs, patient noted to have an elevated calcium  level of 11.1 mg/dL.    Patient has a history of osteopenia ten-year fracture probability by FRAX of 3.2% for major osteoporotic fracture.   She is on long term aromatase inhibitor (AI) therapy using anastrozole . These medications are known to be associated with bone thinning/loss. I do not see where she is on an oral bisphosphonate, injectable bisphosphonate (zoledronic acid), or injectable RANKL inhibitor (denosumab).   During her last visit with medical oncology, it was recommended that patient start on oral Ca2+ 1200 mg + vitamin D 800 IU supplementation daily. I do not see this on her medication list that was completed today. I have reached out to the patient to confirm whether or not she is taking the supplement as recommended.   Impression and Plan:  Sending result to primary attending surgeon Clyda Dark, MD) and medical oncologist Wilhelmenia Harada, MD) to make them aware of elevated preoperative calcium . Assume etiology of hypercalcemia is related to supplementation. If this is the case, risk vs benefits of continued supplementation must be considered. Long term AI therapy stands to exacerbate her known osteopenia. At the same time, the hypercalcemia stands to increase  patient's risk of incident arrhythmia (AF/AFL, VA, bradyarrhythmias), especially in the perioperative setting.   Could potentially look at dose reduction, however I wanted to solicit input from her speciality care providers prior to making any changes to her current long term therapeutic regimen.   ADDENDUM 02/19/2024 at 1615 PM: Confirmed with patient that she is indeed taking the aforementioned calcium  + vitamin D supplements. I have been in communication with Dr. Timmy Forbes, MD (medical oncology) regarding this patient's mild acute hypercalcemia. MD agrees that this is likely a supplement related derangement in the setting of acute elevation. Per MD recommendations, patient to be advised to DISCONTINUE calcium  supplement at this time. Will discuss alternative means of treatment at next visit.   Patient was contacted with the updated directives from oncologist. No further needs from the PAT department at this time.   Kim Caroline, MSN, APRN, FNP-C, CEN [facility]  Perioperative Services Nurse Practitioner Phone: [phone] Fax: [phone] 02/18/24 4:56 PM

## 2024-02-18 NOTE — Pre-Procedure Instructions (Signed)
 Cardiology clearance. Copy and pasted from care everywhere note 01/23/2024:  Assessment & Plan Severe hip pain due to osteoarthritis Severe hip pain due to osteoarthritis, significantly affecting mobility. She is scheduled for hip surgery. Recent stress test from October was normal, indicating cardiac health is sufficient for surgery. - Proceed with hip surgery as planned. Ensure all pre-operative requirements are up to date.  Sabina Custovic, DO Electronically signed by Custovic, Sabina, DO at 01/23/2024 3:08 PM EDT

## 2024-02-18 NOTE — Patient Instructions (Addendum)
 Your procedure is scheduled on: Thursday, June 19 Report to the Registration Desk on the 1st floor of the [facility] To find out your arrival time, please call [phone] between 1PM - 3PM on: Wednesday, June 18 If your arrival time is 6:00 am, do not arrive before that time as the [facility] entrance doors do not open until 6:00 am.  REMEMBER: Instructions that are not followed completely may result in serious medical risk, up to and including death; or upon the discretion of your surgeon and anesthesiologist your surgery may need to be rescheduled.  Do not eat food after midnight the night before surgery.  No gum chewing or hard candies.  You may however, drink CLEAR liquids up to 2 hours before you are scheduled to arrive for your surgery. Do not drink anything within 2 hours of your scheduled arrival time.  Clear liquids include: - water   - apple juice without pulp - gatorade (not RED colors) - black coffee or tea (Do NOT add milk or creamers to the coffee or tea) Do NOT drink anything that is not on this list.  In addition, your doctor has ordered for you to drink the provided:  Ensure Pre-Surgery Clear Carbohydrate Drink  Drinking this carbohydrate drink up to two hours before surgery helps to reduce insulin resistance and improve patient outcomes. Please complete drinking 2 hours before scheduled arrival time.  One week prior to surgery: Stop Anti-inflammatories (NSAIDS) such as Advil, Aleve , Ibuprofen, Motrin, Naproxen , Naprosyn  and Aspirin based products such as Excedrin, Goody's Powder, BC Powder. Stop ANY OVER THE COUNTER supplements until after surgery. Stop turmeric, geritol multiple vitamins.  You may however, continue to take Tylenol  if needed for pain up until the day of surgery.  Continue taking all of your other prescription medications up until the day of surgery.  ON THE DAY OF SURGERY ONLY TAKE THESE MEDICATIONS WITH SIPS OF  WATER :  Amlodipine Anastrozole  (Arimidex )  No Alcohol for 24 hours before or after surgery.  No Smoking including e-cigarettes for 24 hours before surgery.  No chewable tobacco products for at least 6 hours before surgery.  No nicotine patches on the day of surgery.  Do not use any recreational drugs for at least a week (preferably 2 weeks) before your surgery.  Please be advised that the combination of cocaine and anesthesia may have negative outcomes, up to and including death. If you test positive for cocaine, your surgery will be cancelled.  On the morning of surgery brush your teeth with toothpaste and water , you may rinse your mouth with mouthwash if you wish. Do not swallow any toothpaste or mouthwash.  Use CHG Soap as directed on instruction sheet.  Do not wear jewelry, make-up, hairpins, clips or nail polish.  For welded (permanent) jewelry: bracelets, anklets, waist bands, etc.  Please have this removed prior to surgery.  If it is not removed, there is a chance that hospital personnel will need to cut it off on the day of surgery.  Do not wear lotions, powders, or perfumes.   Do not shave body hair from the neck down 48 hours before surgery.  Contact lenses, hearing aids and dentures may not be worn into surgery.  Do not bring valuables to the hospital. [facility] is not responsible for any missing/lost belongings or valuables.   Notify your doctor if there is any change in your medical condition (cold, fever, infection).  Wear comfortable clothing (specific to your surgery type) to the hospital.  After surgery, you can help prevent lung complications by doing breathing exercises.  Take deep breaths and cough every 1-2 hours. Your doctor may order a device called an Incentive Spirometer to help you take deep breaths.  If you are being admitted to the hospital overnight, leave your suitcase in the car. After surgery it may be brought to your room.  In case of  increased patient census, it may be necessary for you, the patient, to continue your postoperative care in the Same Day Surgery department.  If you are being discharged the day of surgery, you will not be allowed to drive home. You will need a responsible individual to drive you home and stay with you for 24 hours after surgery.   If you are taking public transportation, you will need to have a responsible individual with you.  Please call the Pre-admissions Testing Dept. at [phone] if you have any questions about these instructions.  Surgery Visitation Policy:  Patients having surgery or a procedure may have two visitors.  Children under the age of 15 must have an adult with them who is not the patient.  Inpatient Visitation:    Visiting hours are 7 a.m. to 8 p.m. Up to four visitors are allowed at one time in a patient room. The visitors may rotate out with other people during the day.  One visitor age 62 or older may stay with the patient overnight and must be in the room by 8 p.m.    Pre-operative 5 CHG Bath Instructions   You can play a key role in reducing the risk of infection after surgery. Your skin needs to be as free of germs as possible. You can reduce the number of germs on your skin by washing with CHG (chlorhexidine  gluconate) soap before surgery. CHG is an antiseptic soap that kills germs and continues to kill germs even after washing.   DO NOT use if you have an allergy to chlorhexidine /CHG or antibacterial soaps. If your skin becomes reddened or irritated, stop using the CHG and notify one of our RNs at [phone].   Please shower with the CHG soap starting 4 days before surgery using the following schedule:     Please keep in mind the following:  DO NOT shave, including legs and underarms, starting the day of your first shower.   You may shave your face at any point before/day of surgery.  Place clean sheets on your bed the day you start using CHG  soap. Use a clean washcloth (not used since being washed) for each shower. DO NOT sleep with pets once you start using the CHG.   CHG Shower Instructions:  If you choose to wash your hair and private area, wash first with your normal shampoo/soap.  After you use shampoo/soap, rinse your hair and body thoroughly to remove shampoo/soap residue.  Turn the water  OFF and apply about 3 tablespoons (45 ml) of CHG soap to a CLEAN washcloth.  Apply CHG soap ONLY FROM YOUR NECK DOWN TO YOUR TOES (washing for 3-5 minutes)  DO NOT use CHG soap on face, private areas, open wounds, or sores.  Pay special attention to the area where your surgery is being performed.  If you are having back surgery, having someone wash your back for you may be helpful. Wait 2 minutes after CHG soap is applied, then you may rinse off the CHG soap.  Pat dry with a clean towel  Put on clean clothes/pajamas   If you  choose to wear lotion, please use ONLY the CHG-compatible lotions on the back of this paper.     Additional instructions for the day of surgery: DO NOT APPLY any lotions, deodorants, cologne, or perfumes.   Put on clean/comfortable clothes.  Brush your teeth.  Ask your nurse before applying any prescription medications to the skin.      CHG Compatible Lotions   Aveeno Moisturizing lotion  Cetaphil Moisturizing Cream  Cetaphil Moisturizing Lotion  Clairol Herbal Essence Moisturizing Lotion, Dry Skin  Clairol Herbal Essence Moisturizing Lotion, Extra Dry Skin  Clairol Herbal Essence Moisturizing Lotion, Normal Skin  Curel Age Defying Therapeutic Moisturizing Lotion with Alpha Hydroxy  Curel Extreme Care Body Lotion  Curel Soothing Hands Moisturizing Hand Lotion  Curel Therapeutic Moisturizing Cream, Fragrance-Free  Curel Therapeutic Moisturizing Lotion, Fragrance-Free  Curel Therapeutic Moisturizing Lotion, Original Formula  Eucerin Daily Replenishing Lotion  Eucerin Dry Skin Therapy Plus Alpha Hydroxy  Crme  Eucerin Dry Skin Therapy Plus Alpha Hydroxy Lotion  Eucerin Original Crme  Eucerin Original Lotion  Eucerin Plus Crme Eucerin Plus Lotion  Eucerin TriLipid Replenishing Lotion  Keri Anti-Bacterial Hand Lotion  Keri Deep Conditioning Original Lotion Dry Skin Formula Softly Scented  Keri Deep Conditioning Original Lotion, Fragrance Free Sensitive Skin Formula  Keri Lotion Fast Absorbing Fragrance Free Sensitive Skin Formula  Keri Lotion Fast Absorbing Softly Scented Dry Skin Formula  Keri Original Lotion  Keri Skin Renewal Lotion Keri Silky Smooth Lotion  Keri Silky Smooth Sensitive Skin Lotion  Nivea Body Creamy Conditioning Oil  Nivea Body Extra Enriched Lotion  Nivea Body Original Lotion  Nivea Body Sheer Moisturizing Lotion Nivea Crme  Nivea Skin Firming Lotion  NutraDerm 30 Skin Lotion  NutraDerm Skin Lotion  NutraDerm Therapeutic Skin Cream  NutraDerm Therapeutic Skin Lotion  ProShield Protective Hand Cream  Provon moisturizing lotion       Preoperative Educational Videos for Total Hip, Knee and Shoulder Replacements  To better prepare for surgery, please view our videos that explain the physical activity and discharge planning required to have the best surgical recovery at [facility].  [email or web address]  Questions? Call [phone] or email [email or web address]

## 2024-02-19 MED ORDER — TRANEXAMIC ACID-NACL 1000-0.7 MG/100ML-% IV SOLN: 1000.0000 mg | INTRAVENOUS | Status: DC

## 2024-02-19 MED ORDER — CEFAZOLIN SODIUM-DEXTROSE 2-4 GM/100ML-% IV SOLN: 2.0000 g | INTRAVENOUS | Status: DC

## 2024-02-19 MED ORDER — LACTATED RINGERS IV SOLN: INTRAVENOUS | Status: DC

## 2024-02-19 MED ORDER — CHLORHEXIDINE GLUCONATE 0.12 % MT SOLN
15.0000 mL | Freq: Once | OROMUCOSAL | Status: AC
  Administered 2024-02-20: 15 mL via OROMUCOSAL

## 2024-02-19 MED ORDER — DEXAMETHASONE SODIUM PHOSPHATE 10 MG/ML IJ SOLN: 8.0000 mg | Freq: Once | INTRAMUSCULAR | Status: DC

## 2024-02-19 MED ORDER — ORAL CARE MOUTH RINSE
15.0000 mL | Freq: Once | OROMUCOSAL | Status: AC
Start: 2024-02-19 — End: 2024-02-20

## 2024-02-20 ENCOUNTER — Other Ambulatory Visit: Payer: Self-pay

## 2024-02-20 ENCOUNTER — Encounter: Payer: Self-pay | Admitting: Orthopedic Surgery

## 2024-02-20 ENCOUNTER — Other Ambulatory Visit: Payer: Self-pay | Admitting: Orthopedic Surgery

## 2024-02-20 ENCOUNTER — Ambulatory Visit
Admission: RE | Admit: 2024-02-20 | Discharge: 2024-02-20 | Disposition: A | Discharge status: Home / Self Care | Source: Ambulatory Visit | Attending: Orthopedic Surgery | Admitting: Orthopedic Surgery

## 2024-02-20 ENCOUNTER — Encounter: Payer: Self-pay | Admitting: Urgent Care

## 2024-02-20 ENCOUNTER — Encounter
Admission: RE | Disposition: A | Discharge status: Home / Self Care | Payer: Self-pay | Source: Ambulatory Visit | Attending: Orthopedic Surgery

## 2024-02-20 DIAGNOSIS — M1612 Unilateral primary osteoarthritis, left hip: Secondary | ICD-10-CM | POA: Diagnosis present

## 2024-02-20 DIAGNOSIS — Z539 Procedure and treatment not carried out, unspecified reason: Secondary | ICD-10-CM | POA: Diagnosis not present

## 2024-02-20 SURGERY — ARTHROPLASTY, HIP, TOTAL, ANTERIOR APPROACH
Anesthesia: Choice | Site: Hip | Laterality: Left

## 2024-02-20 MED ORDER — TRANEXAMIC ACID-NACL 1000-0.7 MG/100ML-% IV SOLN
INTRAVENOUS | Status: AC
  Filled 2024-02-20: qty 100

## 2024-02-20 MED ORDER — PROPOFOL 1000 MG/100ML IV EMUL
INTRAVENOUS | Status: AC
  Filled 2024-02-20: qty 100

## 2024-02-20 MED ORDER — MIDAZOLAM HCL 2 MG/2ML IJ SOLN
INTRAMUSCULAR | Status: AC
Start: 2024-02-20 — End: 2024-02-20
  Filled 2024-02-20: qty 2

## 2024-02-20 MED ORDER — CEFAZOLIN SODIUM-DEXTROSE 2-4 GM/100ML-% IV SOLN
INTRAVENOUS | Status: AC
  Filled 2024-02-20: qty 100

## 2024-02-20 MED ORDER — PROPOFOL 10 MG/ML IV BOLUS
INTRAVENOUS | Status: AC
  Filled 2024-02-20: qty 20

## 2024-02-20 MED ORDER — CHLORHEXIDINE GLUCONATE 0.12 % MT SOLN
OROMUCOSAL | Status: AC
  Filled 2024-02-20: qty 15

## 2024-02-20 MED ORDER — FENTANYL CITRATE (PF) 100 MCG/2ML IJ SOLN
INTRAMUSCULAR | Status: AC
  Filled 2024-02-20: qty 2

## 2024-02-20 NOTE — Anesthesia Preprocedure Evaluation (Signed)
 Anesthesia Evaluation  Patient identified by MRN, date of birth, ID band Patient awake    Reviewed: Allergy & Precautions, NPO status , Patient's Chart, lab work & pertinent test results  History of Anesthesia Complications Negative for: history of anesthetic complications  Airway Mallampati: II  TM Distance: >3 FB Neck ROM: full    Dental no notable dental hx.    Pulmonary neg pulmonary ROS, former smoker   Pulmonary exam normal        Cardiovascular hypertension, On Medications Normal cardiovascular exam     Neuro/Psych negative neurological ROS  negative psych ROS   GI/Hepatic negative GI ROS, Neg liver ROS,,,  Endo/Other  negative endocrine ROS    Renal/GU negative Renal ROS  negative genitourinary   Musculoskeletal  (+) Arthritis ,    Abdominal   Peds  Hematology negative hematology ROS (+)   Anesthesia Other Findings Past Medical History: No date: Arthritis 05/2023: Ductal carcinoma in situ (DCIS) of right breast No date: Essential hypertension No date: Heart murmur No date: Mixed hyperlipidemia No date: Osteoarthritis of hips, bilateral No date: Osteopenia No date: Personal history of radiation therapy No date: Superficial venous thrombosis of arm, right No date: Thoracic ascending aortic aneurysm [facility])  Past Surgical History: No date: ABDOMINAL HYSTERECTOMY 03/2013: BREAST BIOPSY; Right 05/20/2023: BREAST BIOPSY; Right     Comment:  MM RT BREAST BX W LOC DEV 1ST LESION IMAGE BX SPEC               STEREO GUIDE 05/20/2023 [facility] 05/30/2023: BREAST BIOPSY; Right     Comment:  MM RT RADIO FREQUENCY TAG LOC MAMMO GUIDE 05/30/2023               [facility] 05/31/2023: BREAST LUMPECTOMY; Right 05/30/2023: BREAST LUMPECTOMY,RADIO FREQ LOCALIZER,AXILLARY SENTINEL  LYMPH NODE BIOPSY; Right     Comment:  savi placement No date: CARPAL TUNNEL RELEASE; Right No date:  COLONOSCOPY 03/01/2015: COLONOSCOPY WITH PROPOFOL ; N/A     Comment:  Procedure: COLONOSCOPY WITH PROPOFOL ;  Surgeon: Marnee Sink, MD;  Location: [facility];  Service: Endoscopy;              Laterality: N/A; No date: KNEE ARTHROSCOPY; Right 02/01/2015: KNEE ARTHROSCOPY WITH MEDIAL MENISECTOMY; Right     Comment:  Procedure: KNEE ARTHROSCOPY WITH MEDIAL MENISECTOMY;                Surgeon: Daris Edman, MD;  Location: [facility] ORS;                Service: Orthopedics;  Laterality: Right; 05/31/2023: PART MASTECTOMY,RADIO FREQUENCY LOCALIZER,AXILLARY  SENTINEL NODE BIOPSY; Right     Comment:  Procedure: PARTIAL MASTECTOMY,RADIO FREQUENCY               LOCALIZER,AXILLARY SENTINEL NODE BIOPSY;  Surgeon:               Eldred Grego, MD;  Location: [facility] ORS;  Service:              General;  Laterality: Right;     Reproductive/Obstetrics negative OB ROS                             Anesthesia Physical Anesthesia Plan  ASA: 2  Anesthesia Plan: Spinal   Post-op Pain Management: Regional block*, Toradol IV (intra-op)* and Ofirmev  IV (intra-op)*   Induction: Intravenous  PONV  Risk Score and Plan: 2 and Propofol  infusion, TIVA and Treatment may vary due to age or medical condition  Airway Management Planned: Natural Airway and Nasal Cannula  Additional Equipment:   Intra-op Plan:   Post-operative Plan:   Informed Consent: I have reviewed the patients History and Physical, chart, labs and discussed the procedure including the risks, benefits and alternatives for the proposed anesthesia with the patient or authorized representative who has indicated his/her understanding and acceptance.     Dental Advisory Given  Plan Discussed with: Anesthesiologist, CRNA and Surgeon  Anesthesia Plan Comments: (Patient reports no bleeding problems and no anticoagulant use.  Plan for spinal with backup GA  Patient consented for risks of  anesthesia including but not limited to:  - adverse reactions to medications - damage to eyes, teeth, lips or other oral mucosa - nerve damage due to positioning  - risk of bleeding, infection and or nerve damage from spinal that could lead to paralysis - risk of headache or failed spinal - damage to teeth, lips or other oral mucosa - sore throat or hoarseness - damage to heart, brain, nerves, lungs, other parts of body or loss of life  Patient voiced understanding and assent.)       Anesthesia Quick Evaluation

## 2024-02-21 ENCOUNTER — Telehealth: Payer: Self-pay

## 2024-02-21 ENCOUNTER — Ambulatory Visit

## 2024-02-21 ENCOUNTER — Ambulatory Visit: Payer: No Typology Code available for payment source | Admitting: Radiation Oncology

## 2024-02-21 NOTE — Telephone Encounter (Signed)
   Pre-operative Risk Assessment    Patient Name: Cleva Camero  DOB: 1951/01/27 MRN: [id number]   Date of last office visit: 02/04/24 Constancia Delton, MD Date of next office visit: NONE   Request for Surgical Clearance    Procedure:  LEFT THA  Date of Surgery:  Clearance 03/02/24                                Surgeon:  DR Clyda Dark Surgeon's Group or Practice Name:  [facility] Phone number:  [phone] Fax number:  [phone]   Type of Clearance Requested:   - Medical    Type of Anesthesia:  Not Indicated   Additional requests/questions:    Signed, Collin Deal   02/21/2024, 3:41 PM

## 2024-03-02 ENCOUNTER — Ambulatory Visit: Admit: 2024-03-02 | Admitting: Orthopedic Surgery

## 2024-03-02 SURGERY — ARTHROPLASTY, HIP, TOTAL, ANTERIOR APPROACH
Anesthesia: Choice | Site: Hip | Laterality: Left

## 2024-03-03 ENCOUNTER — Ambulatory Visit: Attending: Cardiology

## 2024-03-03 DIAGNOSIS — I10 Essential (primary) hypertension: Secondary | ICD-10-CM | POA: Diagnosis not present

## 2024-03-03 LAB — ECHOCARDIOGRAM COMPLETE
AR max vel: 2.02 cm2
AV Area VTI: 2.21 cm2
AV Area mean vel: 2.11 cm2
AV Mean grad: 4 mmHg
AV Peak grad: 8.1 mmHg
Ao pk vel: 1.42 m/s
Area-P 1/2: 3.03 cm2
Calc EF: 60.5 %
P 1/2 time: 463 ms
S' Lateral: 2.7 cm
Single Plane A2C EF: 59.9 %
Single Plane A4C EF: 59.4 %

## 2024-03-04 NOTE — Telephone Encounter (Signed)
 2nd request received with updated procedure date of TBD.  Pt was waiting on Echo 03/03/24. Echo was completed 03/03/24.  Will route to preop pool for Preop APP to review.

## 2024-03-05 ENCOUNTER — Other Ambulatory Visit: Payer: Self-pay | Admitting: General Surgery

## 2024-03-05 DIAGNOSIS — Z853 Personal history of malignant neoplasm of breast: Secondary | ICD-10-CM

## 2024-03-09 ENCOUNTER — Ambulatory Visit: Payer: Self-pay | Admitting: Cardiology

## 2024-03-11 NOTE — Telephone Encounter (Signed)
 Will up date the surgeons office.

## 2024-03-11 NOTE — Telephone Encounter (Signed)
Patient calling for update. Please advise  

## 2024-03-11 NOTE — Telephone Encounter (Signed)
   Patient Name: Kim Ruiz  DOB: [birth year] MRN: [id number]  Primary Cardiologist: None  Chart reviewed as part of pre-operative protocol coverage. Given past medical history and time since last visit, based on ACC/AHA guidelines, Kim Ruiz is at acceptable risk for the planned procedure without further cardiovascular testing.   Echocardiogram was read by Dr. Darliss 03/09/2024  Dr. Darliss   Echo shows normal systolic function, EF 60 to 65%.  Mild to moderate aortic valve regurgitation.  Overall no significant structural abnormalities.  The patient was advised that if she develops new symptoms prior to surgery to contact our office to arrange for a follow-up visit, and she verbalized understanding.  I will route this recommendation to the requesting party via Epic fax function and remove from pre-op pool.  Please call with questions.  Lamarr Satterfield, NP 03/11/2024, 3:04 PM

## 2024-03-11 NOTE — Telephone Encounter (Signed)
 Will route to Preop APP for review

## 2024-03-11 NOTE — Telephone Encounter (Signed)
 Pt had Echo done on 03/09/24 and Ortho is calling to see if she is cleared. Please advise

## 2024-03-12 ENCOUNTER — Other Ambulatory Visit: Payer: Self-pay | Admitting: Orthopedic Surgery

## 2024-03-12 ENCOUNTER — Ambulatory Visit: Payer: Self-pay | Admitting: Radiation Oncology

## 2024-03-13 NOTE — Progress Notes (Signed)
  Perioperative Services Pre-Admission/Anesthesia Testing    Date: 03/13/24  Name: Kim Ruiz DOB: 02-28-1951 MRN:   [id number]  Re: Clearance for surgery  Planned Surgical Procedure(s):     Case: [id number] Date/Time: 03/16/24 1002   Procedure: ARTHROPLASTY, HIP, TOTAL, ANTERIOR APPROACH (Left: Hip)   Anesthesia type: Choice   Diagnosis: Primary osteoarthritis of left hip [M16.12]   Pre-op diagnosis: Primary osteoarthritis of left hip M16.12   Location: [facility] OR ROOM 02 / [facility]   Surgeons: Lorelle Hussar, MD        Clinical Notes:  Patient is scheduled for the above procedure on 03/16/2024 with Dr. Hussar Lorelle, MD.  Patient previously scheduled for surgery, however was cancelled on the day of surgery due to an incomplete work-up (pending TTE). Patient has had TTE at this point to assess new systolic murmur on exam. Results reviewed. Patient with norm biventricular systolic function and an EF of 60-65%. There was G1DD noted. There was mild to moderate aortic valve regurgitation. Transvalvular gradients all normal with no evidence of stenosis.   PAT department learned this afternoon that patient had been rescheduled for surgery. We were not informed by the surgeon's office, thus there is no time for formal preoperative review. Patient will need to be seen on the day of her procedure by anesthesia for assessment.   With regards to cardiology clearance for this procedure.  Following review of echocardiogram, cardiology has effectively cleared patient to proceed. Per cardiology, based ACC/AHA guidelines, the patient's past medical history, and the amount of time since her last clinic visit, this patient would be at an overall ACCEPTABLE risk for the planned procedure without further cardiovascular testing or intervention at this time.   No further needs from the PAT department identified at this time.  Dorise Pereyra, MSN, APRN, FNP-C, CEN [facility]  Perioperative Services Nurse Practitioner Phone: [phone] Fax: [phone] 03/13/24 3:20 PM  NOTE: This note has been prepared using Dragon dictation software. Despite my best ability to proofread, there is always the potential that unintentional transcriptional errors may still occur from this process.

## 2024-03-16 ENCOUNTER — Ambulatory Visit: Payer: Self-pay | Admitting: Urgent Care

## 2024-03-16 ENCOUNTER — Other Ambulatory Visit: Payer: Self-pay

## 2024-03-16 ENCOUNTER — Ambulatory Visit

## 2024-03-16 ENCOUNTER — Encounter: Payer: Self-pay | Admitting: Orthopedic Surgery

## 2024-03-16 ENCOUNTER — Encounter
Admission: RE | Disposition: A | Discharge status: Home Health | Payer: Self-pay | Source: Ambulatory Visit | Attending: Orthopedic Surgery

## 2024-03-16 ENCOUNTER — Ambulatory Visit
Admission: RE | Admit: 2024-03-16 | Discharge: 2024-03-17 | Disposition: A | Discharge status: Home Health | Source: Ambulatory Visit | Attending: Orthopedic Surgery | Admitting: Orthopedic Surgery

## 2024-03-16 DIAGNOSIS — Z7901 Long term (current) use of anticoagulants: Secondary | ICD-10-CM | POA: Insufficient documentation

## 2024-03-16 DIAGNOSIS — Z853 Personal history of malignant neoplasm of breast: Secondary | ICD-10-CM | POA: Diagnosis not present

## 2024-03-16 DIAGNOSIS — I351 Nonrheumatic aortic (valve) insufficiency: Secondary | ICD-10-CM | POA: Diagnosis not present

## 2024-03-16 DIAGNOSIS — I7121 Aneurysm of the ascending aorta, without rupture: Secondary | ICD-10-CM | POA: Insufficient documentation

## 2024-03-16 DIAGNOSIS — M1612 Unilateral primary osteoarthritis, left hip: Secondary | ICD-10-CM | POA: Diagnosis present

## 2024-03-16 DIAGNOSIS — I10 Essential (primary) hypertension: Secondary | ICD-10-CM | POA: Insufficient documentation

## 2024-03-16 DIAGNOSIS — Z419 Encounter for procedure for purposes other than remedying health state, unspecified: Secondary | ICD-10-CM

## 2024-03-16 DIAGNOSIS — Z86718 Personal history of other venous thrombosis and embolism: Secondary | ICD-10-CM | POA: Insufficient documentation

## 2024-03-16 DIAGNOSIS — Z96642 Presence of left artificial hip joint: Secondary | ICD-10-CM | POA: Diagnosis present

## 2024-03-16 HISTORY — PX: TOTAL HIP ARTHROPLASTY: SHX124

## 2024-03-16 SURGERY — ARTHROPLASTY, HIP, TOTAL, ANTERIOR APPROACH
Anesthesia: Spinal | Site: Hip | Laterality: Left

## 2024-03-16 MED ORDER — BUPIVACAINE HCL (PF) 0.5 % IJ SOLN
INTRAMUSCULAR | Status: DC | PRN
  Administered 2024-03-16: 2.5 mL

## 2024-03-16 MED ORDER — ACETAMINOPHEN 10 MG/ML IV SOLN
INTRAVENOUS | Status: AC
  Filled 2024-03-16: qty 100

## 2024-03-16 MED ORDER — LOSARTAN POTASSIUM 50 MG PO TABS
50.0000 mg | ORAL_TABLET | Freq: Every day | ORAL | Status: DC
  Administered 2024-03-16 – 2024-03-17 (×2): 50 mg via ORAL
  Filled 2024-03-16 (×2): qty 1

## 2024-03-16 MED ORDER — MIDAZOLAM HCL 2 MG/2ML IJ SOLN
INTRAMUSCULAR | Status: AC
  Filled 2024-03-16: qty 2

## 2024-03-16 MED ORDER — SODIUM CHLORIDE (PF) 0.9 % IJ SOLN
INTRAMUSCULAR | Status: DC | PRN
  Administered 2024-03-16: 50 mL via INTRAMUSCULAR

## 2024-03-16 MED ORDER — PROPOFOL 1000 MG/100ML IV EMUL
INTRAVENOUS | Status: AC
  Filled 2024-03-16: qty 100

## 2024-03-16 MED ORDER — APIXABAN 2.5 MG PO TABS: 2.5000 mg | ORAL_TABLET | Freq: Two times a day (BID) | ORAL | 0 refills | Status: DC

## 2024-03-16 MED ORDER — AMLODIPINE BESYLATE 10 MG PO TABS
10.0000 mg | ORAL_TABLET | Freq: Every day | ORAL | Status: DC
  Administered 2024-03-16 – 2024-03-17 (×2): 10 mg via ORAL
  Filled 2024-03-16 (×2): qty 1

## 2024-03-16 MED ORDER — SODIUM CHLORIDE 0.9 % IV SOLN: INTRAVENOUS | Status: DC

## 2024-03-16 MED ORDER — OXYCODONE HCL 5 MG PO TABS: 2.5000 mg | ORAL_TABLET | Freq: Three times a day (TID) | ORAL | 0 refills | Status: DC | PRN

## 2024-03-16 MED ORDER — KETOROLAC TROMETHAMINE 15 MG/ML IJ SOLN
7.5000 mg | Freq: Four times a day (QID) | INTRAMUSCULAR | Status: DC
  Administered 2024-03-16 – 2024-03-17 (×3): 7.5 mg via INTRAVENOUS
  Filled 2024-03-16 (×3): qty 1

## 2024-03-16 MED ORDER — BUPIVACAINE LIPOSOME 1.3 % IJ SUSP
INTRAMUSCULAR | Status: AC
  Filled 2024-03-16: qty 20

## 2024-03-16 MED ORDER — SODIUM CHLORIDE (PF) 0.9 % IJ SOLN
INTRAMUSCULAR | Status: AC
  Filled 2024-03-16: qty 10

## 2024-03-16 MED ORDER — TRAMADOL HCL 50 MG PO TABS: 50.0000 mg | ORAL_TABLET | Freq: Four times a day (QID) | ORAL | Status: DC | PRN

## 2024-03-16 MED ORDER — HYDROCODONE-ACETAMINOPHEN 5-325 MG PO TABS: 1.0000 | ORAL_TABLET | ORAL | Status: DC | PRN

## 2024-03-16 MED ORDER — APIXABAN 2.5 MG PO TABS
2.5000 mg | ORAL_TABLET | Freq: Two times a day (BID) | ORAL | Status: DC
  Administered 2024-03-17: 2.5 mg via ORAL
  Filled 2024-03-16: qty 1

## 2024-03-16 MED ORDER — ANASTROZOLE 1 MG PO TABS
1.0000 mg | ORAL_TABLET | Freq: Every day | ORAL | Status: DC
  Administered 2024-03-17: 1 mg via ORAL
  Filled 2024-03-16: qty 1

## 2024-03-16 MED ORDER — ACETAMINOPHEN 325 MG PO TABS: 325.0000 mg | ORAL_TABLET | Freq: Four times a day (QID) | ORAL | Status: DC | PRN

## 2024-03-16 MED ORDER — ORAL CARE MOUTH RINSE: 15.0000 mL | Freq: Once | OROMUCOSAL | Status: AC

## 2024-03-16 MED ORDER — PROPOFOL 500 MG/50ML IV EMUL
INTRAVENOUS | Status: DC | PRN
  Administered 2024-03-16: 40 mg via INTRAVENOUS
  Administered 2024-03-16: 50 ug/kg/min via INTRAVENOUS
  Administered 2024-03-16 (×2): 20 mg via INTRAVENOUS

## 2024-03-16 MED ORDER — GLYCOPYRROLATE 0.2 MG/ML IJ SOLN
INTRAMUSCULAR | Status: DC | PRN
  Administered 2024-03-16: .2 mg via INTRAVENOUS

## 2024-03-16 MED ORDER — BUPIVACAINE HCL (PF) 0.5 % IJ SOLN
INTRAMUSCULAR | Status: AC
  Filled 2024-03-16: qty 10

## 2024-03-16 MED ORDER — CHLORHEXIDINE GLUCONATE 0.12 % MT SOLN
OROMUCOSAL | Status: AC
  Filled 2024-03-16: qty 15

## 2024-03-16 MED ORDER — ONDANSETRON HCL 4 MG/2ML IJ SOLN: 4.0000 mg | Freq: Once | INTRAMUSCULAR | Status: DC | PRN

## 2024-03-16 MED ORDER — MORPHINE SULFATE (PF) 4 MG/ML IV SOLN: 0.5000 mg | INTRAVENOUS | Status: DC | PRN

## 2024-03-16 MED ORDER — CEFAZOLIN SODIUM-DEXTROSE 2-4 GM/100ML-% IV SOLN
INTRAVENOUS | Status: AC
  Filled 2024-03-16: qty 100

## 2024-03-16 MED ORDER — CEFAZOLIN SODIUM-DEXTROSE 2-4 GM/100ML-% IV SOLN
2.0000 g | INTRAVENOUS | Status: AC
  Administered 2024-03-16: 2 g via INTRAVENOUS

## 2024-03-16 MED ORDER — GABAPENTIN 100 MG PO CAPS: 100.0000 mg | ORAL_CAPSULE | Freq: Every day | ORAL | Status: DC | PRN

## 2024-03-16 MED ORDER — OXYCODONE HCL 5 MG PO TABS: 5.0000 mg | ORAL_TABLET | Freq: Once | ORAL | Status: DC | PRN

## 2024-03-16 MED ORDER — ACETAMINOPHEN 10 MG/ML IV SOLN: 1000.0000 mg | Freq: Once | INTRAVENOUS | Status: DC | PRN

## 2024-03-16 MED ORDER — ONDANSETRON HCL 4 MG PO TABS: 4.0000 mg | ORAL_TABLET | Freq: Four times a day (QID) | ORAL | Status: DC | PRN

## 2024-03-16 MED ORDER — ACETAMINOPHEN 500 MG PO TABS
1000.0000 mg | ORAL_TABLET | Freq: Three times a day (TID) | ORAL | Status: DC
  Administered 2024-03-16 – 2024-03-17 (×2): 1000 mg via ORAL
  Filled 2024-03-16 (×2): qty 2

## 2024-03-16 MED ORDER — ATORVASTATIN CALCIUM 10 MG PO TABS
20.0000 mg | ORAL_TABLET | Freq: Every day | ORAL | Status: DC
  Administered 2024-03-16: 20 mg via ORAL

## 2024-03-16 MED ORDER — MENTHOL 3 MG MT LOZG: 1.0000 | LOZENGE | OROMUCOSAL | Status: DC | PRN

## 2024-03-16 MED ORDER — MIDAZOLAM HCL 5 MG/5ML IJ SOLN
INTRAMUSCULAR | Status: DC | PRN
Start: 2024-03-16 — End: 2024-03-16
  Administered 2024-03-16 (×2): 1 mg via INTRAVENOUS

## 2024-03-16 MED ORDER — PANTOPRAZOLE SODIUM 40 MG PO TBEC
40.0000 mg | DELAYED_RELEASE_TABLET | Freq: Every day | ORAL | Status: DC
  Administered 2024-03-16 – 2024-03-17 (×2): 40 mg via ORAL
  Filled 2024-03-16 (×2): qty 1

## 2024-03-16 MED ORDER — ONDANSETRON HCL 4 MG/2ML IJ SOLN
INTRAMUSCULAR | Status: AC
  Filled 2024-03-16: qty 2

## 2024-03-16 MED ORDER — LACTATED RINGERS IV SOLN: INTRAVENOUS | Status: DC

## 2024-03-16 MED ORDER — ACETAMINOPHEN 500 MG PO TABS: 1000.0000 mg | ORAL_TABLET | Freq: Three times a day (TID) | ORAL | 0 refills | Status: DC

## 2024-03-16 MED ORDER — PHENYLEPHRINE HCL-NACL 20-0.9 MG/250ML-% IV SOLN
INTRAVENOUS | Status: AC
  Filled 2024-03-16: qty 250

## 2024-03-16 MED ORDER — ONDANSETRON HCL 4 MG PO TABS: 4.0000 mg | ORAL_TABLET | Freq: Four times a day (QID) | ORAL | 0 refills | Status: DC | PRN

## 2024-03-16 MED ORDER — TRANEXAMIC ACID-NACL 1000-0.7 MG/100ML-% IV SOLN
INTRAVENOUS | Status: AC
  Filled 2024-03-16: qty 100

## 2024-03-16 MED ORDER — TRANEXAMIC ACID-NACL 1000-0.7 MG/100ML-% IV SOLN
1000.0000 mg | INTRAVENOUS | Status: AC
  Administered 2024-03-16 (×2): 1000 mg via INTRAVENOUS

## 2024-03-16 MED ORDER — BUPIVACAINE-EPINEPHRINE (PF) 0.25% -1:200000 IJ SOLN
INTRAMUSCULAR | Status: AC
  Filled 2024-03-16: qty 30

## 2024-03-16 MED ORDER — SODIUM CHLORIDE 0.9 % IR SOLN
Status: DC | PRN
  Administered 2024-03-16: 100 mL

## 2024-03-16 MED ORDER — CELECOXIB 100 MG PO CAPS: 100.0000 mg | ORAL_CAPSULE | Freq: Two times a day (BID) | ORAL | 0 refills | Status: AC

## 2024-03-16 MED ORDER — ACETAMINOPHEN 10 MG/ML IV SOLN
INTRAVENOUS | Status: DC | PRN
  Administered 2024-03-16: 1000 mg via INTRAVENOUS

## 2024-03-16 MED ORDER — DOCUSATE SODIUM 100 MG PO CAPS: 100.0000 mg | ORAL_CAPSULE | Freq: Two times a day (BID) | ORAL | 0 refills | Status: DC

## 2024-03-16 MED ORDER — DEXAMETHASONE SODIUM PHOSPHATE 10 MG/ML IJ SOLN
INTRAMUSCULAR | Status: AC
  Filled 2024-03-16: qty 1

## 2024-03-16 MED ORDER — METOCLOPRAMIDE HCL 5 MG/ML IJ SOLN: 5.0000 mg | Freq: Three times a day (TID) | INTRAMUSCULAR | Status: DC | PRN

## 2024-03-16 MED ORDER — SURGIPHOR WOUND IRRIGATION SYSTEM - OPTIME
TOPICAL | Status: DC | PRN
  Administered 2024-03-16: 450 mL

## 2024-03-16 MED ORDER — FENTANYL CITRATE (PF) 100 MCG/2ML IJ SOLN: 25.0000 ug | INTRAMUSCULAR | Status: DC | PRN

## 2024-03-16 MED ORDER — PHENOL 1.4 % MT LIQD: 1.0000 | OROMUCOSAL | Status: DC | PRN

## 2024-03-16 MED ORDER — ONDANSETRON HCL 4 MG/2ML IJ SOLN: 4.0000 mg | Freq: Four times a day (QID) | INTRAMUSCULAR | Status: DC | PRN

## 2024-03-16 MED ORDER — ORAL CARE MOUTH RINSE: 15.0000 mL | OROMUCOSAL | Status: DC | PRN

## 2024-03-16 MED ORDER — METOCLOPRAMIDE HCL 5 MG PO TABS: 5.0000 mg | ORAL_TABLET | Freq: Three times a day (TID) | ORAL | Status: DC | PRN

## 2024-03-16 MED ORDER — OXYCODONE HCL 5 MG/5ML PO SOLN: 5.0000 mg | Freq: Once | ORAL | Status: DC | PRN

## 2024-03-16 MED ORDER — CEFAZOLIN SODIUM-DEXTROSE 2-4 GM/100ML-% IV SOLN
2.0000 g | Freq: Four times a day (QID) | INTRAVENOUS | Status: AC
  Administered 2024-03-16 (×2): 2 g via INTRAVENOUS
  Filled 2024-03-16 (×2): qty 100

## 2024-03-16 MED ORDER — DEXAMETHASONE SODIUM PHOSPHATE 10 MG/ML IJ SOLN
8.0000 mg | Freq: Once | INTRAMUSCULAR | Status: AC
  Administered 2024-03-16: 10 mg via INTRAVENOUS

## 2024-03-16 MED ORDER — DOCUSATE SODIUM 100 MG PO CAPS
100.0000 mg | ORAL_CAPSULE | Freq: Two times a day (BID) | ORAL | Status: DC
  Administered 2024-03-16 – 2024-03-17 (×3): 100 mg via ORAL
  Filled 2024-03-16 (×3): qty 1

## 2024-03-16 MED ORDER — CHLORHEXIDINE GLUCONATE 0.12 % MT SOLN
15.0000 mL | Freq: Once | OROMUCOSAL | Status: AC
  Administered 2024-03-16: 15 mL via OROMUCOSAL

## 2024-03-16 MED ORDER — TRAMADOL HCL 50 MG PO TABS: 50.0000 mg | ORAL_TABLET | Freq: Four times a day (QID) | ORAL | 0 refills | Status: DC | PRN

## 2024-03-16 MED ORDER — PHENYLEPHRINE HCL-NACL 20-0.9 MG/250ML-% IV SOLN
INTRAVENOUS | Status: DC | PRN
Start: 2024-03-16 — End: 2024-03-16
  Administered 2024-03-16: 20 ug/min via INTRAVENOUS

## 2024-03-16 MED ORDER — ATORVASTATIN CALCIUM 20 MG PO TABS
ORAL_TABLET | ORAL | Status: AC
Start: 2024-03-16 — End: 2024-03-16
  Filled 2024-03-16: qty 1

## 2024-03-16 MED ORDER — ONDANSETRON HCL 4 MG/2ML IJ SOLN
INTRAMUSCULAR | Status: DC | PRN
  Administered 2024-03-16: 4 mg via INTRAVENOUS

## 2024-03-16 SURGICAL SUPPLY — 58 items
BLADE CLIPPER SURG (BLADE) IMPLANT
BLADE SAGITTAL AGGR TOOTH XLG (BLADE) ×1 IMPLANT
BNDG COHESIVE 6X5 TAN ST LF (GAUZE/BANDAGES/DRESSINGS) ×2 IMPLANT
BRUSH SCRUB EZ PLAIN DRY (MISCELLANEOUS) ×1 IMPLANT
CHLORAPREP W/TINT 26 (MISCELLANEOUS) ×1 IMPLANT
DERMABOND ADVANCED .7 DNX12 (GAUZE/BANDAGES/DRESSINGS) ×1 IMPLANT
DRAPE C-ARM XRAY 36X54 (DRAPES) ×1 IMPLANT
DRAPE SHEET LG 3/4 BI-LAMINATE (DRAPES) ×2 IMPLANT
DRAPE TABLE BACK 80X90 (DRAPES) ×1 IMPLANT
DRSG MEPILEX SACRM 8.7X9.8 (GAUZE/BANDAGES/DRESSINGS) ×1 IMPLANT
DRSG OPSITE POSTOP 4X6 (GAUZE/BANDAGES/DRESSINGS) IMPLANT
DRSG OPSITE POSTOP 4X8 (GAUZE/BANDAGES/DRESSINGS) ×1 IMPLANT
ELECTRODE BLDE 4.0 EZ CLN MEGD (MISCELLANEOUS) ×1 IMPLANT
ELECTRODE REM PT RTRN 9FT ADLT (ELECTROSURGICAL) ×1 IMPLANT
GLOVE BIO SURGEON STRL SZ8 (GLOVE) ×1 IMPLANT
GLOVE BIOGEL PI IND STRL 8 (GLOVE) ×1 IMPLANT
GLOVE PI ORTHO PRO STRL 7.5 (GLOVE) ×2 IMPLANT
GLOVE PI ORTHO PRO STRL SZ8 (GLOVE) ×2 IMPLANT
GLOVE SURG SYN 7.5 PF PI (GLOVE) ×1 IMPLANT
GOWN SRG XL LVL 3 NONREINFORCE (GOWNS) ×1 IMPLANT
GOWN STRL REUS W/ TWL LRG LVL3 (GOWN DISPOSABLE) ×1 IMPLANT
GOWN STRL REUS W/ TWL XL LVL3 (GOWN DISPOSABLE) ×1 IMPLANT
HEAD CERAMIC FEMORAL 36MM (Head) IMPLANT
HOOD PEEL AWAY T7 (MISCELLANEOUS) ×2 IMPLANT
INSERT TRIDENT POLY 36 0DEG (Insert) IMPLANT
IV NS 100ML SINGLE PACK (IV SOLUTION) ×1 IMPLANT
KIT PATIENT CARE HANA TABLE (KITS) ×1 IMPLANT
KIT TURNOVER CYSTO (KITS) ×1 IMPLANT
LIGHT WAVEGUIDE WIDE FLAT (MISCELLANEOUS) ×1 IMPLANT
MANIFOLD NEPTUNE II (INSTRUMENTS) ×1 IMPLANT
MARKER SKIN DUAL TIP RULER LAB (MISCELLANEOUS) ×1 IMPLANT
MAT ABSORB FLUID 56X50 GRAY (MISCELLANEOUS) ×1 IMPLANT
NDL SPNL 20GX3.5 QUINCKE YW (NEEDLE) ×1 IMPLANT
NEEDLE SPNL 20GX3.5 QUINCKE YW (NEEDLE) ×1 IMPLANT
NS IRRIG 500ML POUR BTL (IV SOLUTION) ×1 IMPLANT
PACK HIP COMPR (MISCELLANEOUS) ×1 IMPLANT
PAD ARMBOARD POSITIONER FOAM (MISCELLANEOUS) ×1 IMPLANT
PENCIL SMOKE EVACUATOR (MISCELLANEOUS) ×1 IMPLANT
SCREW HEX LP 6.5X20 (Screw) IMPLANT
SCREW HEX LP 6.5X30 (Screw) IMPLANT
SHELL ACETABUL CLUSTER SZ 54 (Shell) IMPLANT
SLEEVE SCD COMPRESS KNEE MED (STOCKING) ×1 IMPLANT
SOLUTION IRRIG SURGIPHOR (IV SOLUTION) ×1 IMPLANT
STEM STD OFFSET SZ5 36 (Stem) IMPLANT
SURGIFLO W/THROMBIN 8M KIT (HEMOSTASIS) IMPLANT
SUT BONE WAX W31G (SUTURE) ×1 IMPLANT
SUT ETHIBOND 2 V 37 (SUTURE) ×1 IMPLANT
SUT SILK 0 30XBRD TIE 6 (SUTURE) ×1 IMPLANT
SUT STRATAFIX 14 PDO 36 VLT (SUTURE) ×1 IMPLANT
SUT VIC AB 0 CT1 36 (SUTURE) ×1 IMPLANT
SUT VIC AB 2-0 CT2 27 (SUTURE) ×1 IMPLANT
SUTURE STRATA SPIR 4-0 18 (SUTURE) ×1 IMPLANT
SYR 20ML LL LF (SYRINGE) ×2 IMPLANT
TAPE MICROFOAM 4IN (TAPE) IMPLANT
TOWEL OR 17X26 4PK STRL BLUE (TOWEL DISPOSABLE) IMPLANT
TRAP FLUID SMOKE EVACUATOR (MISCELLANEOUS) ×1 IMPLANT
WAND WEREWOLF FASTSEAL 6.0 (MISCELLANEOUS) ×1 IMPLANT
WATER STERILE IRR 1000ML POUR (IV SOLUTION) ×1 IMPLANT

## 2024-03-16 NOTE — Transfer of Care (Signed)
 Immediate Anesthesia Transfer of Care Note  Patient: Kim Ruiz  Procedure(s) Performed: ARTHROPLASTY, HIP, TOTAL, ANTERIOR APPROACH (Left: Hip)  Patient Location: PACU  Anesthesia Type:Spinal  Level of Consciousness: drowsy  Airway & Oxygen Therapy: Patient Spontanous Breathing and Patient connected to face mask oxygen  Post-op Assessment: Report given to RN and Post -op Vital signs reviewed and stable  Post vital signs: Reviewed and stable  Last Vitals:  Vitals Value Taken Time  BP 157/64 03/16/24 12:30  Temp 36.3 C 03/16/24 12:30  Pulse 91 03/16/24 12:32  Resp 19 03/16/24 12:32  SpO2 100 % 03/16/24 12:32  Vitals shown include unfiled device data.  Last Pain:  Vitals:   03/16/24 1230  TempSrc:   PainSc: Asleep         Complications: No notable events documented.

## 2024-03-16 NOTE — Anesthesia Procedure Notes (Signed)
 Procedure Name: MAC Date/Time: 03/16/2024 10:26 AM  Performed by: Lorrene Camelia LABOR, CRNAPre-anesthesia Checklist: Patient identified, Emergency Drugs available, Suction available and Patient being monitored Patient Re-evaluated:Patient Re-evaluated prior to induction Oxygen Delivery Method: Simple face mask Preoxygenation: Pre-oxygenation with 100% oxygen Induction Type: IV induction

## 2024-03-16 NOTE — Op Note (Signed)
 Patient Name: Linnet Bottari  FMW:[id number]  Pre-Operative Diagnosis: Left hip Osteoarthritis  Post-Operative Diagnosis: (same)  Procedure: Left Total Hip Arthroplasty  Components/Implants: Cup: Trident Tritanium Clusterhole 54/E    Liner: Neutral X3 36/E  Stem: Insignia #5 std offset  Head:Biolox Ceramic 36 +33mm  Date of Surgery: 03/16/2024  Surgeon: Arthea Sheer MD  Assistant: Debby Amber PA (present and scrubbed throughout the case, critical for assistance with exposure, retraction, instrumentation, and closure)   Anesthesiologist: Mazzoni  Anesthesia: Spinal   EBL: 150cc  IVF:500cc  Complications: None   Brief history: The patient is a 73 year old female with a history of osteoarthritis of the left hip with pain limiting their range of motion and activities of daily living, which has failed multiple attempts at conservative therapy.  The risks and benefits of total hip arthroplasty as definitive surgical treatment were discussed with the patient, who opted to proceed with the operation.  After outpatient medical clearance and optimization was completed the patient was admitted to [facility] for the procedure.  All preoperative films were reviewed and an appropriate surgical plan was made prior to surgery.   Description of procedure: The patient was brought to the operating room where laterality was confirmed by all those present to be the left side.  The patient was administered spinal anesthesia on a stretcher prior to being moved supine on the operating room table. Patient was given an intravenous dose of antibiotics for surgical prophylaxis and TXA.  All bony prominences and extremities were well padded and the patient was securely attached to the table boots, a perineal post was placed and the patient had a safety strap placed.  Surgical site was prepped with alcohol and chlorhexidine . The surgical site over the hip was and draped in typical  sterile fashion with multiple layers of adhesive and nonadhesive drapes.  The incision site was marked out with a sterile marker and care was taken to assess the position of the ASIS and ensure appropriate position for the incision.    A surgical timeout was then called with participation of all staff in the room the patient was then a confirmed again and laterality confirmed.  Incision was made over the anterior lateral aspect of the proximal thigh in line with the TFL.  Appropriate retractors were placed and all bleeding vessels were coagulated within the subcutaneous and fatty layers.  An incision was made in the TFL fascia in the interval was carefully identified.  The lateral ascending branches of the circumflex vessels were identified, cauterized and carefully dissected. The main vessels were then tied with a 0 silk hand tie.  Retractors were placed around the superior lateral and inferior medial aspects of the femoral neck and a capsulotomy was performed exposing the hip joint.  Retraction stitches were placed and the capsulotomy to assist with visualization.  Femoral neck cut was then made and the femoral head was extracted after placing the leg in traction.  Bone wax was then applied to the proximal cut surface of the femur and water  cooled bipolar electrocautery was used to address any bleeding around the femoral neck cut.  Retractors were then placed around the acetabulum to fully visualize the joint space, and the remaining labral tissue was removed and pulvinar was removed.   The acetabulum was then sequentially reamed up to the appropriate size in order to get good fit and fill for the acetabular component while under fluoroscopic guidance.  Acetabular component was then placed and malleted into a secure  fit while confirming position and abduction angle and anteversion utilizing fluoroscopy.  2 screws were then placed in the acetabular cup to assist in securing the cup in place. The cup was  irrigated,  a real neutral liner was placed, impacted, and checked for stability. The femur traction was dropped and sequentially externally rotated while performing a release of the posterior and superomedial tissues off of the proximal femur to allow for mobility, care was taken to preserve the external rotators and piriformis attachments.  The remaining interval between the abductors and the capsule was dissected out and a retractor was placed over the superolateral aspect of the femur over the greater trochanter.  The leg was carefully brought down into extension and adducted to provide visualization of the proximal femur for broaching.  The femur was then sequentially broached up to an appropriate size which provided for good fill and stability to the femoral broach.  A trial neck and head were placed on the femoral broach and the leg was brought up for reduction.  The hip was reduced and manual check of stability was performed.  The hip was found to be stable in flexion internal rotation and extension external rotation.  Leg lengths were confirmed on fluoroscopy.   The hip was then dislocated the trial neck and head were removed.  The leg was then brought down into extension and adduction in the proximal femur was reexposed.  The broach trial was removed and the femur was irrigated with normal saline prior to the real femoral stem being implanted.  After the femoral stem was seated and shown to have good fit and fill the appropriate head was impacted the leg was brought up and reduced.  There was good range of motion with stability in flexion internal rotation and extension external rotation on testing.  Leg lengths were found to be appropriate on fluoroscopic evaluation at this time.  The hip was then irrigated with betdine based surgiphor solution and then saline solution.  The capsulotomy was repaired with Ethibond sutures.  A pericapsular and peritrochanteric cocktail with Exparel  and bupivacaine  was  then injected as well as the subcutaneous tissues. The fascia was closed with a #1 barbed running suture.  The deep tissues were closed with Vicryl sutures the subcutaneous tissues were closed with interrupted Vicryl sutures and a running barbed 4-0 suture.  The skin was then reinforced with Dermabond and a sterile dressing was placed.   The patient was awoken from anesthesia transferred off of the operating room table onto a hospital bed where examination of leg lengths found the leg lengths to be equal with a good distal pulse.  The patient was then transferred to the PACU in stable condition.

## 2024-03-16 NOTE — Anesthesia Procedure Notes (Signed)
 Spinal  Patient location during procedure: OR Start time: 03/16/2024 10:30 AM End time: 03/16/2024 10:34 AM Reason for block: surgical anesthesia Staffing Performed: resident/CRNA  Anesthesiologist: Shellie Odor, MD Resident/CRNA: Lorrene Camelia LABOR, CRNA Performed by: Lorrene Camelia LABOR, CRNA Authorized by: Mazzoni, Andrea, MD   Preanesthetic Checklist Completed: patient identified, IV checked, site marked, risks and benefits discussed, surgical consent, monitors and equipment checked, pre-op evaluation and timeout performed Spinal Block Patient position: sitting Prep: Betadine Patient monitoring: heart rate, continuous pulse ox, blood pressure and cardiac monitor Approach: midline Location: L4-5 Injection technique: single-shot Needle Needle type: Whitacre and Introducer  Needle gauge: 24 G Needle length: 9 cm Assessment Events: CSF return Additional Notes Negative paresthesia. Negative blood return. Positive free-flowing CSF. Expiration date of kit checked and confirmed. Patient tolerated procedure well, without complications.

## 2024-03-16 NOTE — Discharge Instructions (Signed)
 Instructions after Anterior Total Hip Replacement        Dr. Zachary Aberman, Jr., M.D.      Dept. of Orthopaedics & Sports Medicine  [facility]  [address]  [place], [place]  [address]  Phone: [phone]   Fax: [phone]    DIET: Drink plenty of non-alcoholic fluids. Resume your normal diet. Include foods high in fiber.  ACTIVITY:  You may use crutches or a walker with weight-bearing as tolerated, unless instructed otherwise. You may be weaned off of the walker or crutches by your Physical Therapist.  Continue doing gentle exercises. Exercising will reduce the pain and swelling, increase motion, and prevent muscle weakness.   Please continue to use the TED compression stockings for 2 weeks. You may remove the stockings at night, but should reapply them in the morning. Do not drive or operate any equipment until instructed.  WOUND CARE:  Continue to use ice packs periodically to reduce pain and swelling. You may shower with honeycomb dressing 3 days after your surgery. Do not submerge incision site under water . Remove honeycomb dressing 7 days after surgery and allow dermabond to fall off on its own.   MEDICATIONS: You may resume your regular medications. Please take the pain medication as prescribed on the medication list. Do not take pain medication on an empty stomach. You have been given a prescription for a blood thinner to prevent blood clots. Please take Eliquis  as prescribed. Pain medications and iron supplements can cause constipation. Use a stool softener (Senokot or Colace) on a daily basis and a laxative (dulcolax or miralax) as needed. Do not drive or drink alcoholic beverages when taking pain medications.  POSTOPERATIVE CONSTIPATION PROTOCOL Constipation - defined medically as fewer than three stools per week and severe constipation as less than one stool per week.  One of the most common issues patients have following surgery is constipation.   Even if you have a regular bowel pattern at home, your normal regimen is likely to be disrupted due to multiple reasons following surgery.  Combination of anesthesia, postoperative narcotics, change in appetite and fluid intake all can affect your bowels.  In order to avoid complications following surgery, here are some recommendations in order to help you during your recovery period.  Colace (docusate) - Pick up an over-the-counter form of Colace or another stool softener and take twice a day as long as you are requiring postoperative pain medications.  Take with a full glass of water  daily.  If you experience loose stools or diarrhea, hold the colace until you stool forms back up.  If your symptoms do not get better within 1 week or if they get worse, check with your doctor.  Dulcolax (bisacodyl) - Pick up over-the-counter and take as directed by the product packaging as needed to assist with the movement of your bowels.  Take with a full glass of water .  Use this product as needed if not relieved by Colace only.   MiraLax (polyethylene glycol) - Pick up over-the-counter to have on hand.  MiraLax is a solution that will increase the amount of water  in your bowels to assist with bowel movements.  Take as directed and can mix with a glass of water , juice, soda, coffee, or tea.  Take if you go more than two days without a movement. Do not use MiraLax more than once per day. Call your doctor if you are still constipated or irregular after using this medication for 7 days in a row.  If you continue to have problems with postoperative constipation, please contact the office for further assistance and recommendations.  If you experience the worst abdominal pain ever or develop nausea or vomiting, please contact the office immediatly for further recommendations for treatment.   CALL THE OFFICE FOR: Temperature above 101 degrees Excessive bleeding or drainage on the dressing. Excessive swelling, coldness,  or paleness of the toes. Persistent nausea and vomiting.  FOLLOW-UP:  You should have an appointment to return to the office in 2 weeks after surgery. Arrangements have been made for continuation of Physical Therapy (either home therapy or outpatient therapy).
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
## 2024-03-16 NOTE — Anesthesia Preprocedure Evaluation (Addendum)
 Anesthesia Evaluation  Patient identified by MRN, date of birth, ID band Patient awake    Reviewed: Allergy & Precautions, NPO status , Patient's Chart, lab work & pertinent test results  History of Anesthesia Complications Negative for: history of anesthetic complications  Airway Mallampati: I   Neck ROM: Full    Dental  (+) Edentulous Upper, Edentulous Lower   Pulmonary former smoker (quit 1988)   Pulmonary exam normal breath sounds clear to auscultation       Cardiovascular hypertension, + Peripheral Vascular Disease (TAA 4.2 cm on 01/16/24)  Normal cardiovascular exam Rhythm:Regular Rate:Normal  Hx DVT RUE, no longer on anticoagulation after 1-month course  ECG 02/04/24:  Normal sinus rhythm Possible Left atrial enlargement Minimal voltage criteria for LVH, may be normal variant ( R in aVL )  Echo 07/04/23:  NORMAL STRESS ECHOCARDIOGRAM. NORMAL RESTING STUDY WITH NO WALL MOTION ABNORMALITIES AT REST AND PEAK EXERCISE.  Maximum workload of 3.4 METs was achieved during exercise.     Neuro/Psych negative neurological ROS     GI/Hepatic negative GI ROS,,,  Endo/Other  negative endocrine ROS    Renal/GU negative Renal ROS     Musculoskeletal  (+) Arthritis ,    Abdominal   Peds  Hematology  (+) REFUSES BLOOD PRODUCTS, JEHOVAH'S WITNESSBreast CA   Anesthesia Other Findings Reviewed and agree with Dorise Boor pre-anesthesia clinical review note.    Vasc surg note 02/06/24:  Assessment/Plan 1. Aneurysm of ascending aorta without rupture (HCC) (Primary) Recommend:   No surgery or intervention is indicated at this time.   The patient has an asymptomatic thoracic aortic aneurysm that is less than 6.0 cm in maximal diameter.  I have discussed the natural history of thoracic aortic aneurysm and the small risk of rupture for aneurysm less than 6.5 cm in size.  However, as these small aneurysms tend to enlarge over  time, continued surveillance with CT scan is mandatory.    I have also discussed optimizing medical management with hypertension and lipid control and the negative effect that any tobacco products have on aneurysmal disease.  The patient is also encouraged to exercise a minimum of 30 minutes 4 times a week.    Should the patient develop new onset chest or back pain or signs of peripheral embolization they are instructed to seek medical attention immediately and to alert the physician providing care that they have an aneurysm in the chest.    The patient voices their understanding.   The patient will return as ordered with a CT scan of the chest   2. Infrarenal abdominal aortic aneurysm (AAA) without rupture (HCC) Recommend: No surgery or intervention is indicated at this time.   The patient has an asymptomatic abdominal aortic aneurysm that is less than 4 cm in maximal diameter.     I have reviewed the natural history of abdominal aortic aneurysm and the small risk of rupture for aneurysm less than 5 cm in size.   However, as these small aneurysms tend to enlarge over time, continued surveillance with ultrasound or CT scan is mandatory.    I have also discussed optimizing medical management with hypertension and lipid control and the negative effect that any tobacco products have on aneurysmal disease.  The patient is also encouraged to exercise a minimum of 30 minutes 4 times a week.    Should the patient develop new onset abdominal or back pain or signs of peripheral embolization they are instructed to seek medical attention immediately and to  alert the physician providing care that they have an aneurysm.    The patient voices their understanding.   The patient will return in 12 months with an aortic duplex.   3. Superficial venous thrombosis of arm, right The patient developed superficial thrombophlebitis of the upper extremity during treatment for her breast cancer.  This would not  indicate a hypercoagulable situation that would support placement of an IVC filter prior to nonvascular surgery.  We will continue to observe.  No IVC filter at this time.   4. Essential hypertension Continue antihypertensive medications as already ordered, these medications have been reviewed and there are no changes at this time.   5. Mixed hyperlipidemia Continue statin as ordered and reviewed, no changes at this time    Cardiology note 02/04/24:  1. Hypertension, BP elevated.  Start losartan  50 mg daily, continue Norvasc  10 mg daily. 2. Systolic murmur, repeat echo to evaluate any significant valvular abnormalities.   Follow-up after echo.  Patient wants to follow-up with [facility].   Reproductive/Obstetrics                              Anesthesia Physical Anesthesia Plan  ASA: 3  Anesthesia Plan: Spinal   Post-op Pain Management:    Induction: Intravenous  PONV Risk Score and Plan: 3 and Propofol  infusion, TIVA, Treatment may vary due to age or medical condition and Ondansetron   Airway Management Planned: Natural Airway and Nasal Cannula  Additional Equipment:   Intra-op Plan:   Post-operative Plan:   Informed Consent: I have reviewed the patients History and Physical, chart, labs and discussed the procedure including the risks, benefits and alternatives for the proposed anesthesia with the patient or authorized representative who has indicated his/her understanding and acceptance.       Plan Discussed with: CRNA  Anesthesia Plan Comments: (Plan for spinal and GA with natural airway, LMA/GETA backup.  Patient consented for risks of anesthesia including but not limited to:  - adverse reactions to medications - damage to eyes, teeth, lips or other oral mucosa - nerve damage due to positioning  - sore throat or hoarseness - headache, bleeding, infection, nerve damage 2/2 spinal - damage to heart, brain, nerves, lungs, other  parts of body or loss of life  Informed patient about role of CRNA in peri- and intra-operative care.  Patient voiced understanding.)         Anesthesia Quick Evaluation

## 2024-03-16 NOTE — Interval H&P Note (Signed)
Patient history and physical updated. Consent reviewed including risks, benefits, and alternatives to surgery. Patient agrees with above plan to proceed with left anterior total hip arthroplasty  

## 2024-03-16 NOTE — H&P (Signed)
 History of Present Illness: Kim Ruiz is an 73 y.o. female presents for evaluation of her left hip. Patient reports she has had sharp throbbing and aching pains in her left groin and thigh which has become more constant over the last several years. She has severe stiffness in both hips which limit her ability to walk and function. She reports this pain is limiting her on a daily basis preventing her from walking or bending down to pick anything up off the floor or play with her granddaughter. She does have a recent blood clot in her right upper extremity after a breast procedure. She is scheduled to see vascular surgery tomorrow for follow-up evaluation. She denies any specific trauma to the hips reports 10 out of 10 mostly in the left hip mostly stiffness in the right hip at this time. She the patient denies fevers, chills, numbness, tingling, shortness of breath, chest pain, recent illness, or any trauma.  Patient has a BMI of 27 A1c of 6.0 and is a non-smoker  Past Medical History: Past Medical History:  Diagnosis Date  Heart murmur  Hyperlipidemia  Hypertension   Past Surgical History: Past Surgical History:  Procedure Laterality Date  MASTECTOMY PARTIAL Right 05/31/2023  Dr. Lucas Ruiz --- w/ RF & SN  knee surgery 2014 Dr. Claudene Right   Past Family History: Family History  Problem Relation Age of Onset  Myocardial Infarction (Heart attack) Mother   Medications: Current Outpatient Medications  Medication Sig Dispense Refill  losartan  (COZAAR ) 50 MG tablet Take 50 mg by mouth once daily  amLODIPine  (NORVASC ) 5 MG tablet TAKE 1 TABLET(5 MG) BY MOUTH EVERY DAY 30 tablet 11  anastrozole  (ARIMIDEX ) 1 mg tablet Take 1 mg by mouth once daily  apixaban  (ELIQUIS ) 2.5 mg tablet Take 2.5 mg by mouth 2 (two) times daily (Patient not taking: Reported on 01/23/2024)  atorvastatin  (LIPITOR) 20 MG tablet Take 1 tablet (20 mg total) by mouth once daily 90 tablet 3  calcium   carbonate-vitamin D3 (CALTRATE 600+D) 600 mg-10 mcg (400 unit) tablet Take 1 tablet by mouth as needed  cyclobenzaprine  (FLEXERIL ) 10 MG tablet Take 10 mg by mouth at bedtime as needed  gabapentin  (NEURONTIN ) 600 MG tablet Take 600mg  once daily as needed for head/neck pain. Do not take with other sedating medications. 30 tablet 4  ibuprofen (MOTRIN) 800 MG tablet Take 800 mg by mouth every 6 (six) hours as needed  lidocaine  (LIDODERM ) 5 % patch  multivit-min36/iron/folic acid (GERITOL COMPLETE ORAL) Take 1 tablet by mouth once daily  multivitamin tablet Take 1 tablet by mouth once daily  potassium chloride (K-TAB) 20 mEq TbER ER tablet Take 1 tablet by mouth once daily (Patient not taking: Reported on 01/23/2024)  traMADoL  (ULTRAM ) 50 mg tablet Take 1 tablet by mouth every 6 (six) hours (Patient not taking: Reported on 01/23/2024)  turmeric, bulk, 100 % Powd Take 2 capsules by mouth once daily   No current facility-administered medications for this visit.   Allergies: No Known Allergies   Visit Vitals: Vitals:  02/05/24 1544  BP: (!) 148/86    Review of Systems:  A comprehensive 14 point ROS was performed, reviewed, and the pertinent orthopaedic findings are documented in the HPI.  Physical Exam: Body mass index is 27.41 kg/m. General/Constitutional: No apparent distress: well-nourished and well developed. Lymphatic: No palpable adenopathy. Pulmonary exam: Lungs clear to auscultation bilaterally no wheezing rales or rhonchi Cardiac exam: Regular rate and rhythm no obvious murmurs rubs or gallops. Vascular: No edema,  swelling or tenderness, except as noted in detailed exam. Integumentary: No impressive skin lesions present, except as noted in detailed exam. Neuro/Psych: Normal mood and affect, oriented to person, place and time. Musculoskeletal: Normal, except as noted in detailed exam and in HPI.  Left hip exam  SKIN: intact SWELLING: none WARMTH: no warmth TENDERNESS: none,  Stinchfield Positive ROM: 0 degrees internal rotation and 0 degrees external rotation and pain with any motion of the hip localized to the groin,; Hip Flexion 75 STRENGTH: limited by pain GAIT: unable to walk STABILITY: stable to testing CREPITUS: yes LEG LENGTH DISCREPANCY: none NEUROLOGICAL EXAM: normal VASCULAR EXAM: normal LUMBAR SPINE: tenderness: no straight leg raising sign: no motor exam: normal  The contralateral hip was examined for comparison and it showed: TENDERNESS: none ROM: 0 degrees of internal rotation/0 degrees of external rotation pain with any motion of the hip localized to the groin STRENGTH: limited by pain STABILITY: stable to testing  Hip Imaging :  I reviewed AP pelvis and lateral left hip x-rays performed on 12/18/2023 images reviewed myself. There is severe degeneration of both hips with bone-on-bone articulation with osteophyte formation sclerosis and cystic changes. Severe bilateral hip arthrosis. There is degenerative changes noted to the partially visualized lumbosacral spine as well. No fractures or dislocations noted.  Assessment:  Bilateral hip osteoarthritis  Plan: [name] is a 73 year old female presents with bilateral hip bone on bone arthritis. Based upon the patient's continued symptoms and failure to respond to conservative treatment, I have recommended a left total hip replacement for this patient. A long discussion took place with the patient describing what a total joint replacement is and what the procedure would entail. A hip model, similar to the implants that will be used during the operation, was utilized to demonstrate the implants. Choices of implant manufactures were discussed and reviewed. The ability to secure the implant utilizing cement or cementless (press fit) fixation was discussed. Anterior and posterior exposures were discussed. For this patient an appropriate approach will be anterior.   The hospitalization and post-operative  care and rehabilitation were also discussed. The use of perioperative antibiotics and DVT prophylaxis were discussed. The risk, benefits and alternatives to a surgical intervention were discussed at length with the patient. The patient was also advised of risks related to the medical comorbidities and elevated body mass index (BMI). A lengthy discussion took place to review the most common complications including but not limited to: deep vein thrombosis, pulmonary embolus, heart attack, stroke, infection, wound breakdown, heterotopic ossification, dislocation, numbness, leg length in-equality, intraoperative fracture, damage to nerves, tendon,muscles, arteries or other blood vessels, death and other possible complications from anesthesia. The patient was told that we will take steps to minimize these risks by using sterile technique, antibiotics and DVT prophylaxis when appropriate and follow the patient postoperatively in the office setting to monitor progress. The possibility of recurrent pain, no improvement in pain and actual worsening of pain were also discussed with the patient. The risk of dislocation following total hip replacement was discussed and potential precautions to prevent dislocation were reviewed.   Patient asked about and confirms no history of any reactions to metal or metal allergy in the past.  The discharge plan of care focused on the patient going home following surgery. The patient was encouraged to make the necessary arrangements to have someone stay with them when they are discharged home.   The benefits of surgery were discussed with the patient including the potential for improving the patient's  current clinical condition through operative intervention. Alternatives to surgical intervention including continued conservative management were also discussed in detail. All questions were answered to the satisfaction of the patient. The patient participated and agreed to the plan of  care as well as the use of the recommended implants for their total hip replacement surgery. An information packet was given to the patient to review prior to surgery.   Patient received medical and cardiac clearance for surgery. No plan for IVCF per vascular, will plan for eliquis  post op. All questions answered and patient agrees to the above plan for a left anterior total hip replacement.  Portions of this record have been created using Scientist, clinical (histocompatibility and immunogenetics). Dictation errors have been sought, but may not have been identified and corrected.  Arthea Sheer MD

## 2024-03-16 NOTE — Discharge Summary (Signed)
 Physician Discharge Summary  Patient ID: Kim Ruiz MRN: [id number] DOB/AGE: May 05, 1951 73 y.o.  Admit date: 03/16/2024 Discharge date: 03/17/2024  Admission Diagnoses:  Primary osteoarthritis of left hip [M16.12] S/P total left hip arthroplasty [S03.357]   Discharge Diagnoses: Patient Active Problem List   Diagnosis Date Noted   S/P total left hip arthroplasty 03/16/2024   AAA (abdominal aortic aneurysm) without rupture (HCC) 02/09/2024   Essential hypertension 12/09/2023   Hyperlipidemia 12/09/2023   Pain in joint, lower leg 12/09/2023   Thoracic aortic aneurysm (TAA) (HCC) 12/08/2023   Osteopenia 11/21/2023   Hypokalemia 11/21/2023   Superficial venous thrombosis of arm, right 08/22/2023   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/24/2023   Goals of care, counseling/discussion 05/24/2023   Family history of breast cancer 05/24/2023   Abnormal gait 02/29/2020   Knee pain 02/29/2020   Knee stiff 02/29/2020   Tear of medial meniscus of knee 02/29/2020   Special screening for malignant neoplasms, colon    Benign neoplasm of sigmoid colon    Benign neoplasm of ascending colon     Past Medical History:  Diagnosis Date   Arthritis    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/2023   Essential hypertension    Heart murmur    Mixed hyperlipidemia    Osteoarthritis of hips, bilateral    Osteopenia    Personal history of radiation therapy    Superficial venous thrombosis of arm, right    Thoracic ascending aortic aneurysm (HCC)      Transfusion: none   Consultants (if any):   Discharged Condition: Improved  Hospital Course: Gweneth Fredlund is an 73 y.o. female who was admitted 03/16/2024 with a diagnosis of S/P total left hip arthroplasty and went to the operating room on 03/16/2024 and underwent the above named procedures.    Surgeries: Procedure(s): ARTHROPLASTY, HIP, TOTAL, ANTERIOR APPROACH on 03/16/2024 Patient tolerated the surgery well.  Taken to PACU where she was stabilized and then transferred to the orthopedic floor.  Started on Eliquis  2.5 mg BID. TEDs and SCDs applied bilaterally. Heels elevated on bed. No evidence of DVT. Negative Homan. Physical therapy started on day #1 for gait training and transfer. OT started day #1 for ADL and assisted devices.  Patient's IV was d/c on day #1. Patient was able to safely and independently complete all PT goals. PT recommending discharge to home.    On post op day #1 patient was stable and ready for discharge to Home with HHPT.  Implants: Cup: Trident Tritanium Clusterhole 54/E    Liner: Neutral X3 36/E  Stem: Insignia #5 std offset  Head:Biolox Ceramic 36 +2mm    She was given perioperative antibiotics:  Anti-infectives (From admission, onward)    Start     Dose/Rate Route Frequency Ordered Stop   03/16/24 1630  ceFAZolin  (ANCEF ) IVPB 2g/100 mL premix        2 g 200 mL/hr over 30 Minutes Intravenous Every 6 hours 03/16/24 1423 03/16/24 2255   03/16/24 0930  ceFAZolin  (ANCEF ) IVPB 2g/100 mL premix        2 g 200 mL/hr over 30 Minutes Intravenous On call to O.R. 03/16/24 9081 03/16/24 1051     .  She was given sequential compression devices, early ambulation, and Eliquis  TEDs for DVT prophylaxis.  She benefited maximally from the hospital stay and there were no complications.    Recent vital signs:  Vitals:   03/17/24 0453 03/17/24 0757  BP: (!) 143/72 136/78  Pulse: 70 64  Resp: 18 17  Temp: 98.3 F (36.8 C) 97.6 F (36.4 C)  SpO2: 99% 98%    Recent laboratory studies:  Lab Results  Component Value Date   HGB 11.6 (L) 03/17/2024   HGB 13.1 02/18/2024   HGB 12.5 11/21/2023   Lab Results  Component Value Date   WBC 12.4 (H) 03/17/2024   PLT 199 03/17/2024   No results found for: INR Lab Results  Component Value Date   NA 140 03/17/2024   K 4.0 03/17/2024   CL 110 03/17/2024   CO2 24 03/17/2024   BUN 20 03/17/2024   CREATININE 0.83 03/17/2024    GLUCOSE 144 (H) 03/17/2024    Discharge Medications:   Allergies as of 03/17/2024   No Known Allergies      Medication List     STOP taking these medications    ibuprofen 800 MG tablet Commonly known as: ADVIL       TAKE these medications    acetaminophen  500 MG tablet Commonly known as: TYLENOL  Take 2 tablets (1,000 mg total) by mouth every 8 (eight) hours.   amLODipine  10 MG tablet Commonly known as: NORVASC  Take 10 mg by mouth daily.   anastrozole  1 MG tablet Commonly known as: ARIMIDEX  Take 1 tablet (1 mg total) by mouth daily.   apixaban  2.5 MG Tabs tablet Commonly known as: ELIQUIS  Take 1 tablet (2.5 mg total) by mouth every 12 (twelve) hours for 28 days.   atorvastatin  20 MG tablet Commonly known as: LIPITOR Take 20 mg by mouth at bedtime.   celecoxib  100 MG capsule Commonly known as: CeleBREX  Take 1 capsule (100 mg total) by mouth 2 (two) times daily for 10 days.   cyclobenzaprine  10 MG tablet Commonly known as: FLEXERIL  Take 10 mg by mouth at bedtime as needed for muscle spasms.   docusate sodium  100 MG capsule Commonly known as: COLACE Take 1 capsule (100 mg total) by mouth 2 (two) times daily.   gabapentin  100 MG capsule Commonly known as: NEURONTIN  Take 100 mg by mouth daily as needed (pain).   GERITOL COMPLETE PO Take 1 tablet by mouth daily.   Klor-Con 20 MEQ packet Generic drug: potassium chloride Take 20 mEq by mouth daily.   lidocaine  5 % Commonly known as: LIDODERM  Place 1 patch onto the skin daily as needed (pain).   losartan  50 MG tablet Commonly known as: COZAAR  Take 1 tablet (50 mg total) by mouth daily.   ondansetron  4 MG tablet Commonly known as: ZOFRAN  Take 1 tablet (4 mg total) by mouth every 6 (six) hours as needed for nausea.   oxyCODONE  5 MG immediate release tablet Commonly known as: Roxicodone  Take 0.5 tablets (2.5 mg total) by mouth every 8 (eight) hours as needed for breakthrough pain.   traMADol  50 MG  tablet Commonly known as: ULTRAM  Take 1 tablet (50 mg total) by mouth every 6 (six) hours as needed for moderate pain (pain score 4-6).   TURMERIC-GINGER PO Take 2 each by mouth daily.        Diagnostic Studies: DG C-Arm 1-60 Min-No Report Result Date: 03/16/2024 CLINICAL DATA:  [id number] Surgery, elective [id number]; surgery EXAM: DG HIP (WITH OR WITHOUT PELVIS) 2-3V LEFT; DG C-ARM 1-60 MIN-NO REPORT COMPARISON:  X-ray pelvis 03/01/2016 FINDINGS: Intraoperative total left hip arthroplasty. 3low resolution intraoperative spot views of the left hip were obtained. No fracture visible on the limited views. Severe degenerative changes of the right hip. Total fluoroscopy time: 29  seconds Total radiation dose: 3.4 mGy IMPRESSION: Intraoperative total left hip arthroplasty. Electronically Signed   By: Morgane  Naveau M.D.   On: 03/16/2024 14:13   DG HIP UNILAT WITH PELVIS 2-3 VIEWS LEFT Result Date: 03/16/2024 CLINICAL DATA:  [id number] Surgery, elective [id number]; surgery EXAM: DG HIP (WITH OR WITHOUT PELVIS) 2-3V LEFT; DG C-ARM 1-60 MIN-NO REPORT COMPARISON:  X-ray pelvis 03/01/2016 FINDINGS: Intraoperative total left hip arthroplasty. 3low resolution intraoperative spot views of the left hip were obtained. No fracture visible on the limited views. Severe degenerative changes of the right hip. Total fluoroscopy time: 29 seconds Total radiation dose: 3.4 mGy IMPRESSION: Intraoperative total left hip arthroplasty. Electronically Signed   By: Morgane  Naveau M.D.   On: 03/16/2024 14:13   DG C-Arm 1-60 Min-No Report Result Date: 03/16/2024 CLINICAL DATA:  [id number] Surgery, elective [id number]; surgery EXAM: DG HIP (WITH OR WITHOUT PELVIS) 2-3V LEFT; DG C-ARM 1-60 MIN-NO REPORT COMPARISON:  X-ray pelvis 03/01/2016 FINDINGS: Intraoperative total left hip arthroplasty. 3low resolution intraoperative spot views of the left hip were obtained. No fracture visible on the limited views. Severe degenerative changes of the right hip.  Total fluoroscopy time: 29 seconds Total radiation dose: 3.4 mGy IMPRESSION: Intraoperative total left hip arthroplasty. Electronically Signed   By: Morgane  Naveau M.D.   On: 03/16/2024 14:13   ECHOCARDIOGRAM COMPLETE Result Date: 03/03/2024    ECHOCARDIOGRAM REPORT   Patient Name:   LATANA COLIN Date of Exam: 03/03/2024 Medical Rec #:  [id number]          Height:       67.0 in Accession #:    [id number]         Weight:       173.0 lb Date of Birth:  May 09, 1951           BSA:          1.901 m Patient Age:    73 years           BP:           168/80 mmHg Patient Gender: F                  HR:           70 bpm. Exam Location:  [facility] Procedure: 2D Echo, Cardiac Doppler and Color Doppler (Both Spectral and Color            Flow Doppler were utilized during procedure). Indications:    I10 Hypertension  History:        Patient has no prior history of Echocardiogram examinations.                 Risk Factors:Hypertension and Dyslipidemia.  Sonographer:    Alm Minerva Referring Phys: [id number] BRIAN AGBOR-ETANG IMPRESSIONS  1. Left ventricular ejection fraction, by estimation, is 60 to 65%. The left ventricle has normal function. The left ventricle has no regional wall motion abnormalities. Left ventricular diastolic parameters are consistent with Grade I diastolic dysfunction (impaired relaxation).  2. Right ventricular systolic function is normal. The right ventricular size is normal. Tricuspid regurgitation signal is inadequate for assessing PA pressure.  3. The mitral valve is normal in structure. No evidence of mitral valve regurgitation. No evidence of mitral stenosis.  4. The aortic valve is tricuspid. Aortic valve regurgitation is mild to moderate. No aortic stenosis is present.  5. The inferior vena cava is normal in size with greater than 50% respiratory variability, suggesting right atrial pressure of 3 mmHg.  FINDINGS  Left Ventricle: Left ventricular ejection fraction, by estimation, is 60 to 65%. The  left ventricle has normal function. The left ventricle has no regional wall motion abnormalities. Strain was performed and the global longitudinal strain is indeterminate. The left ventricular internal cavity size was normal in size. There is no left ventricular hypertrophy. Left ventricular diastolic parameters are consistent with Grade I diastolic dysfunction (impaired relaxation). Right Ventricle: The right ventricular size is normal. No increase in right ventricular wall thickness. Right ventricular systolic function is normal. Tricuspid regurgitation signal is inadequate for assessing PA pressure. Left Atrium: Left atrial size was normal in size. Right Atrium: Right atrial size was normal in size. Pericardium: There is no evidence of pericardial effusion. Mitral Valve: The mitral valve is normal in structure. No evidence of mitral valve regurgitation. No evidence of mitral valve stenosis. Tricuspid Valve: The tricuspid valve is normal in structure. Tricuspid valve regurgitation is not demonstrated. No evidence of tricuspid stenosis. Aortic Valve: The aortic valve is tricuspid. Aortic valve regurgitation is mild to moderate. Aortic regurgitation PHT measures 463 msec. No aortic stenosis is present. Aortic valve mean gradient measures 4.0 mmHg. Aortic valve peak gradient measures 8.1 mmHg. Aortic valve area, by VTI measures 2.21 cm. Pulmonic Valve: The pulmonic valve was normal in structure. Pulmonic valve regurgitation is not visualized. No evidence of pulmonic stenosis. Aorta: The aortic root is normal in size and structure. Venous: The inferior vena cava is normal in size with greater than 50% respiratory variability, suggesting right atrial pressure of 3 mmHg. IAS/Shunts: No atrial level shunt detected by color flow Doppler. Additional Comments: 3D was performed not requiring image post processing on an independent workstation and was indeterminate.  LEFT VENTRICLE PLAX 2D LVIDd:         4.30 cm      Diastology LVIDs:         2.70 cm     LV e' medial:    5.44 cm/s LV PW:         1.10 cm     LV E/e' medial:  10.7 LV IVS:        0.90 cm     LV e' lateral:   5.55 cm/s LVOT diam:     1.90 cm     LV E/e' lateral: 10.5 LV SV:         60 LV SV Index:   32 LVOT Area:     2.84 cm  LV Volumes (MOD) LV vol d, MOD A2C: 44.1 ml LV vol d, MOD A4C: 97.1 ml LV vol s, MOD A2C: 17.7 ml LV vol s, MOD A4C: 39.4 ml LV SV MOD A2C:     26.4 ml LV SV MOD A4C:     97.1 ml LV SV MOD BP:      39.6 ml RIGHT VENTRICLE RV Basal diam:  3.60 cm RV S prime:     13.60 cm/s TAPSE (M-mode): 2.2 cm LEFT ATRIUM           Index LA Vol (A4C): 69.6 ml 36.61 ml/m  AORTIC VALVE AV Area (Vmax):    2.02 cm AV Area (Vmean):   2.11 cm AV Area (VTI):     2.21 cm AV Vmax:           142.00 cm/s AV Vmean:          93.700 cm/s AV VTI:            0.273 m AV Peak Grad:  8.1 mmHg AV Mean Grad:      4.0 mmHg LVOT Vmax:         101.00 cm/s LVOT Vmean:        69.700 cm/s LVOT VTI:          0.213 m LVOT/AV VTI ratio: 0.78 AI PHT:            463 msec MITRAL VALVE MV Area (PHT): 3.03 cm    SHUNTS MV Decel Time: 250 msec    Systemic VTI:  0.21 m MV E velocity: 58.20 cm/s  Systemic Diam: 1.90 cm MV A velocity: 94.30 cm/s MV E/A ratio:  0.62 Evalene Lunger MD Electronically signed by Evalene Lunger MD Signature Date/Time: 03/03/2024/6:47:07 PM    Final     Disposition: Discharge disposition: 06-Home-Health Care Svc          Follow-up Information     Charlene Debby BROCKS, PA-C Follow up in 2 week(s).   Specialties: Orthopedic Surgery, Emergency Medicine Contact information: [address] [place] [address] [phone]                  Signed: Debby BROCKS Charlene 03/17/2024, 8:12 AM

## 2024-03-17 ENCOUNTER — Encounter: Payer: Self-pay | Admitting: Orthopedic Surgery

## 2024-03-17 DIAGNOSIS — M1612 Unilateral primary osteoarthritis, left hip: Secondary | ICD-10-CM | POA: Diagnosis not present

## 2024-03-17 LAB — CBC
HCT: 35.7 % — ABNORMAL LOW (ref 36.0–46.0)
Hemoglobin: 11.6 g/dL — ABNORMAL LOW (ref 12.0–15.0)
MCH: 25.6 pg — ABNORMAL LOW (ref 26.0–34.0)
MCHC: 32.5 g/dL (ref 30.0–36.0)
MCV: 78.8 fL — ABNORMAL LOW (ref 80.0–100.0)
Platelets: 199 K/uL (ref 150–400)
RBC: 4.53 MIL/uL (ref 3.87–5.11)
RDW: 13.8 % (ref 11.5–15.5)
WBC: 12.4 K/uL — ABNORMAL HIGH (ref 4.0–10.5)
nRBC: 0 % (ref 0.0–0.2)

## 2024-03-17 LAB — BASIC METABOLIC PANEL WITH GFR
Anion gap: 6 (ref 5–15)
BUN: 20 mg/dL (ref 8–23)
CO2: 24 mmol/L (ref 22–32)
Calcium: 10.5 mg/dL — ABNORMAL HIGH (ref 8.9–10.3)
Chloride: 110 mmol/L (ref 98–111)
Creatinine, Ser: 0.83 mg/dL (ref 0.44–1.00)
GFR, Estimated: >60 mL/min (ref >60)
Glucose, Bld: 144 mg/dL — ABNORMAL HIGH (ref 70–99)
Potassium: 4 mmol/L (ref 3.5–5.1)
Sodium: 140 mmol/L (ref 135–145)

## 2024-03-17 NOTE — Plan of Care (Signed)
 Progressing towards discharge

## 2024-03-17 NOTE — Anesthesia Postprocedure Evaluation (Signed)
 Anesthesia Post Note  Patient: Kim Ruiz  Procedure(s) Performed: ARTHROPLASTY, HIP, TOTAL, ANTERIOR APPROACH (Left: Hip)  Patient location during evaluation: Nursing Unit Anesthesia Type: Spinal Level of consciousness: oriented and awake and alert Pain management: pain level controlled Vital Signs Assessment: post-procedure vital signs reviewed and stable Respiratory status: spontaneous breathing and respiratory function stable Cardiovascular status: blood pressure returned to baseline and stable Postop Assessment: no headache, no backache, no apparent nausea or vomiting and patient able to bend at knees Anesthetic complications: no   No notable events documented.   Last Vitals:  Vitals:   03/16/24 2342 03/17/24 0453  BP: 128/60 (!) 143/72  Pulse: 62 70  Resp: 16 18  Temp: 36.7 C 36.8 C  SpO2: 98% 99%    Last Pain:  Vitals:   03/17/24 0453  TempSrc: Oral  PainSc:                  Alfrieda LILLETTE Lan
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
## 2024-03-17 NOTE — Progress Notes (Signed)
   Subjective: 1 Day Post-Op Procedure(s) (LRB): ARTHROPLASTY, HIP, TOTAL, ANTERIOR APPROACH (Left) Patient reports pain as mild.   Patient is well, and has had no acute complaints or problems Denies any CP, SOB, ABD pain. We will continue therapy today.  Plan is to go Home after hospital stay.  Objective: Vital signs in last 24 hours: Temp:  [97.1 F (36.2 C)-98.3 F (36.8 C)] 97.6 F (36.4 C) (07/15 0757) Pulse Rate:  [50-76] 64 (07/15 0757) Resp:  [12-20] 17 (07/15 0757) BP: (128-195)/(58-89) 136/78 (07/15 0757) SpO2:  [93 %-100 %] 98 % (07/15 0757) Weight:  [77.1 kg] 77.1 kg (07/14 0924)  Intake/Output from previous day: 07/14 0701 - 07/15 0700 In: 1333.9 [I.V.:733.9; IV Piggyback:600] Out: 750 [Urine:600; Blood:150] Intake/Output this shift: No intake/output data recorded.  Recent Labs    03/17/24 0522  HGB 11.6*   Recent Labs    03/17/24 0522  WBC 12.4*  RBC 4.53  HCT 35.7*  PLT 199   Recent Labs    03/17/24 0522  NA 140  K 4.0  CL 110  CO2 24  BUN 20  CREATININE 0.83  GLUCOSE 144*  CALCIUM  10.5*   No results for input(s): LABPT, INR in the last 72 hours.  EXAM General - Patient is Alert, Appropriate, and Oriented Extremity - Neurovascular intact Sensation intact distally Intact pulses distally Dorsiflexion/Plantar flexion intact Dressing - dressing C/D/I and no drainage Motor Function - intact, moving foot and toes well on exam.   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   Weight as of this encounter: 77.1 kg. Advance diet Up with therapy Pain well controlled Labs and VSS CM to assist with discharge to home with HHPT pending safe completion of PT goals  DVT Prophylaxis - TED hose and Eliquis , SCDs Weight-Bearing as tolerated to left leg   T. Medford Amber, PA-C [facility] 03/17/2024, 8:11 AM

## 2024-03-17 NOTE — Progress Notes (Signed)
 DISCHARGE NOTE:    Pt IV removed and dc instructions given. Pt voices no question or concerns at this time. Pt has both TED hose on and in place. Pt's 3 in 1 and RW will be delivered to her house per pt request. Pt's sister provided transportation. Pt wheeled down by staff to medical mall entrance.

## 2024-03-17 NOTE — TOC Transition Note (Signed)
 Transition of Care [facility]) - Discharge Note   Patient Details  Name: Kim Ruiz MRN: [id number] Date of Birth: 09/24/1950  Transition of [facility]) CM/SW Contact:  Lauraine JAYSON Carpen, LCSW Phone Number: 03/17/2024, 11:02 AM   Clinical Narrative:   Patient has orders to discharge home today. CSW met with patient. No family at bedside. CSW introduced role and explained that therapy recommendations would be discussed. Patient was set up with [facility] prior to admission and she is agreeable to this. DME recommendations for RW and 3-in-1. Per RN, patient prefers that the DME be delivered to the home because her ride (sister) will be here at 11:00. [employer] liaison is aware. No further concerns. CSW signing off.  Final next level of care: Home w Home Health Services Barriers to Discharge: No Barriers Identified   Patient Goals and CMS Choice            Discharge Placement                Patient to be transferred to facility by: Sister   Patient and family notified of of transfer: 03/17/24  Discharge Plan and Services Additional resources added to the After Visit Summary for                  DME Arranged: Walker rolling, 3-N-1 DME Agency: [employer] Date DME Agency Contacted: 03/17/24   Representative spoke with at DME Agency: [facility] HH Arranged: PT, OT HH Agency: [facility] Date HH Agency Contacted: 03/17/24   Representative spoke with at [facility] Agency: Shaun  Social Drivers of Health (SDOH) Interventions SDOH Screenings   Food Insecurity: No Food Insecurity (03/16/2024)  Housing: Low Risk  (03/16/2024)  Transportation Needs: No Transportation Needs (03/16/2024)  Utilities: Not At Risk (03/16/2024)  Depression (PHQ2-9): Low Risk  (05/24/2023)  Social Connections: Moderately Integrated (03/16/2024)  Tobacco Use: Medium Risk (03/16/2024)     Readmission Risk Interventions     No data to display

## 2024-03-17 NOTE — Plan of Care (Signed)

## 2024-03-17 NOTE — TOC CM/SW Note (Addendum)
 Patient is not able to walk the distance required to go the bathroom, or he/she is unable to safely negotiate stairs required to access the bathroom.  A 3in1 BSC will alleviate this problem

## 2024-04-28 ENCOUNTER — Ambulatory Visit
Admission: RE | Admit: 2024-04-28 | Discharge: 2024-04-28 | Disposition: A | Discharge status: Home / Self Care | Source: Ambulatory Visit | Attending: General Surgery | Admitting: General Surgery

## 2024-04-28 DIAGNOSIS — Z853 Personal history of malignant neoplasm of breast: Secondary | ICD-10-CM

## 2024-05-07 ENCOUNTER — Ambulatory Visit: Attending: Cardiology | Admitting: Cardiology

## 2024-05-21 ENCOUNTER — Inpatient Hospital Stay (HOSPITAL_BASED_OUTPATIENT_CLINIC_OR_DEPARTMENT_OTHER): Admitting: Oncology

## 2024-05-21 ENCOUNTER — Inpatient Hospital Stay: Attending: Oncology

## 2024-05-21 ENCOUNTER — Encounter: Payer: Self-pay | Admitting: Oncology

## 2024-05-21 VITALS — BP 168/88 | HR 72 | Temp 98.6°F | Resp 20 | Wt 170.7 lb

## 2024-05-21 DIAGNOSIS — Z87891 Personal history of nicotine dependence: Secondary | ICD-10-CM | POA: Diagnosis not present

## 2024-05-21 DIAGNOSIS — M8580 Other specified disorders of bone density and structure, unspecified site: Secondary | ICD-10-CM | POA: Diagnosis not present

## 2024-05-21 DIAGNOSIS — Z79899 Other long term (current) drug therapy: Secondary | ICD-10-CM | POA: Insufficient documentation

## 2024-05-21 DIAGNOSIS — Z803 Family history of malignant neoplasm of breast: Secondary | ICD-10-CM

## 2024-05-21 DIAGNOSIS — Z807 Family history of other malignant neoplasms of lymphoid, hematopoietic and related tissues: Secondary | ICD-10-CM | POA: Diagnosis not present

## 2024-05-21 DIAGNOSIS — Z170 Estrogen receptor positive status [ER+]: Secondary | ICD-10-CM | POA: Insufficient documentation

## 2024-05-21 DIAGNOSIS — D0511 Intraductal carcinoma in situ of right breast: Secondary | ICD-10-CM

## 2024-05-21 LAB — CBC WITH DIFFERENTIAL (CANCER CENTER ONLY)
Abs Immature Granulocytes: 0.03 K/uL (ref 0.00–0.07)
Basophils Absolute: 0.1 K/uL (ref 0.0–0.1)
Basophils Relative: 1 %
Eosinophils Absolute: 0.1 K/uL (ref 0.0–0.5)
Eosinophils Relative: 1 %
HCT: 37.8 % (ref 36.0–46.0)
Hemoglobin: 11.6 g/dL — ABNORMAL LOW (ref 12.0–15.0)
Immature Granulocytes: 0 %
Lymphocytes Relative: 37 %
Lymphs Abs: 2.5 K/uL (ref 0.7–4.0)
MCH: 24.7 pg — ABNORMAL LOW (ref 26.0–34.0)
MCHC: 30.7 g/dL (ref 30.0–36.0)
MCV: 80.6 fL (ref 80.0–100.0)
Monocytes Absolute: 0.6 K/uL (ref 0.1–1.0)
Monocytes Relative: 8 %
Neutro Abs: 3.5 K/uL (ref 1.7–7.7)
Neutrophils Relative %: 53 %
Platelet Count: 247 K/uL (ref 150–400)
RBC: 4.69 MIL/uL (ref 3.87–5.11)
RDW: 14.4 % (ref 11.5–15.5)
WBC Count: 6.8 K/uL (ref 4.0–10.5)
nRBC: 0 % (ref 0.0–0.2)

## 2024-05-21 LAB — CMP (CANCER CENTER ONLY)
ALT: 17 U/L (ref 0–44)
AST: 24 U/L (ref 15–41)
Albumin: 4 g/dL (ref 3.5–5.0)
Alkaline Phosphatase: 122 U/L (ref 38–126)
Anion gap: 6 (ref 5–15)
BUN: 13 mg/dL (ref 8–23)
CO2: 23 mmol/L (ref 22–32)
Calcium: 9.5 mg/dL (ref 8.9–10.3)
Chloride: 109 mmol/L (ref 98–111)
Creatinine: 0.83 mg/dL (ref 0.44–1.00)
GFR, Estimated: >60 mL/min (ref >60)
Glucose, Bld: 84 mg/dL (ref 70–99)
Potassium: 3.6 mmol/L (ref 3.5–5.1)
Sodium: 138 mmol/L (ref 135–145)
Total Bilirubin: 0.8 mg/dL (ref 0.0–1.2)
Total Protein: 7 g/dL (ref 6.5–8.1)

## 2024-05-21 MED ORDER — ANASTROZOLE 1 MG PO TABS: 1.0000 mg | ORAL_TABLET | Freq: Every day | ORAL | 1 refills | Status: AC

## 2024-05-21 NOTE — Assessment & Plan Note (Signed)
 09/05/23 DEXA showed osteopenia, major osteoporotic fracture risk is 3.2% in 10 years.  Recommend  vitamin D supplementation. History hypercalcemia. Check PTH Repeat DEXA in 2027

## 2024-05-21 NOTE — Assessment & Plan Note (Signed)
 Referred to genetic counseling. She had a discussion and declined testing.

## 2024-05-21 NOTE — Progress Notes (Signed)
 Hematology/Oncology Progress note [phone] [phone]        REFERRING PROVIDER: Buren Rock HERO, MD    CHIEF COMPLAINTS/PURPOSE OF CONSULTATION:  Right breast high grade DCIS  ASSESSMENT & PLAN:   Cancer Staging  Ductal carcinoma in situ (DCIS) of right breast Staging form: Breast, AJCC 8th Edition - Clinical stage from 05/24/2023: Stage 0 (cTis (DCIS), cN0, cM0) - Signed by Babara Call, MD on 05/24/2023   Ductal carcinoma in situ (DCIS) of right breast Right breast DCIS s/p lumpectomy and SLNB. ER 95% positive, status postlumpectomy and adjuvant radiation.  On Arimidex  1 mg daily. She tolerates well.  Continue current regimen.  Recommend calcium  and vitamin D supplementation. Annual mammogram, through Dr. Boone office   Family history of breast cancer Referred to genetic counseling. She had a discussion and declined testing.   Osteopenia 09/05/23 DEXA showed osteopenia, major osteoporotic fracture risk is 3.2% in 10 years.  Recommend  vitamin D supplementation. History hypercalcemia. Check PTH Repeat DEXA in 2027   Orders Placed This Encounter  Procedures   CMP (Cancer Center only)    Standing Status:   Future    Expected Date:   11/18/2024    Expiration Date:   02/16/2025   CBC with Differential (Cancer Center Only)    Standing Status:   Future    Expected Date:   11/18/2024    Expiration Date:   02/16/2025   Parathyroid hormone, intact (no Ca)    Standing Status:   Future    Expected Date:   11/18/2024    Expiration Date:   02/16/2025   Follow up 6 months All questions were answered. The patient knows to call the clinic with any problems, questions or concerns.  Call Babara, MD, PhD [facility] Hematology Oncology 05/21/2024    HISTORY OF PRESENTING ILLNESS:  Kim Ruiz 73 y.o. female presents to establish care for right breast DCIS I have reviewed her chart and materials related to her cancer extensively and collaborated history  with the patient. Summary of oncologic history is as follows: Oncology History  Ductal carcinoma in situ (DCIS) of right breast  04/30/2023 Imaging   Bilateral screening mammogram showed  In the right breast, calcifications warrant further evaluation with magnified views. In the left breast, no findings suspicious for malignancy.     05/10/2023 Mammogram   Right diagnostic mammogram showed Magnified views are performed of calcifications in the UPPER-OUTER QUADRANT of the RIGHT breast. These views demonstrate a 6 millimeter group of pleomorphic and linear calcifications.   05/24/2023 Initial Diagnosis   Ductal carcinoma in situ (DCIS) of right breast  05/20/2023  1. Breast, right, needle core biopsy, upper outer posterior depth, ribbon clip:  DCIS, high grade, comedo -type  Necrosis present, calcification present, fibrocystic changes, adenosis.    Menarche at age of 89 No children OCP use: No History of hysterectomy: yes  Menopausal status: postmenopausal History of HRT use: No History of chest radiation: No  Number of previous breast biopsies:  No   05/24/2023 Cancer Staging   Staging form: Breast, AJCC 8th Edition - Clinical stage from 05/24/2023: Stage 0 (cTis (DCIS), cN0, cM0) - Signed by Babara Call, MD on 05/24/2023 Stage prefix: Initial diagnosis Nuclear grade: G3   05/31/2023 Surgery   Patient underwent lumpectomy and also got SLNB due to the upper outer quadrant location and being high-grade with comedonecrosis   1. Breast, lumpectomy, Right mass :      - RESIDUAL DUCTAL CARCINOMA IN SITU (  DCIS).      - SEE CANCER SUMMARY BELOW.      - BIOPSY SITE CHANGE WITH ASSOCIATED CLIP.      - SCOUT TAG PRESENT.       2. Breast, excision, Anterior margin :      - BENIGN BREAST TISSUE; NEGATIVE FOR MALIGNANCY.       3. Breast, excision, Superior margin :      - BENIGN BREAST TISSUE; NEGATIVE FOR MALIGNANCY.       4. Lymph node, sentinel, biopsy, Right axillary #1 :      - ONE  LYMPH NODE NEGATIVE FOR METASTATIC CARCINOMA (0/1).      - SEE NOTE.       5. Lymph node, sentinel, biopsy, right axillary #2 :      - ONE LYMPH NODE NEGATIVE FOR METASTATIC CARCINOMA (0/1).      - SEE NOTE.       6. Lymph node, sentinel, biopsy, right axillary #3 :      - ONE LYMPH NODE NEGATIVE FOR METASTATIC CARCINOMA (0/1).      - SEE NOTE.       7. Lymph node, sentinel, biopsy, right axillary #4 :      - ONE LYMPH NODE NEGATIVE FOR METASTATIC CARCINOMA (0/1).      - SEE NOTE.   MARGINS  Margin Status: All margins negative for DCIS by a distance of at least 2 mm   REGIONAL LYMPH NODES  Regional Lymph node Status: All regional lymph nodes negative for tumor  Total Number of Lymph Nodes Examined (sentinel and non-sentinel): 4  Number of Sentinel Nodes Examined: 4   ER 95% positive   07/10/2023 - 08/09/2023 Radiation Therapy   Adjuvant right breast radiation    08/13/2023, right upper extremity venous ultrasound was obtained which showed no DVT.  Questionable superficial venous thrombosis of a branch of the cephalic vein in the medial upper arm.- finished 4 weeks of Eliquis  and came off.    Patient reports feeling well today. She tolerates Arimidex  with no significant side effects.    MEDICAL HISTORY:  Past Medical History:  Diagnosis Date   Arthritis    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/2023   Essential hypertension    Heart murmur    Mixed hyperlipidemia    Osteoarthritis of hips, bilateral    Osteopenia    Personal history of radiation therapy    Superficial venous thrombosis of arm, right    Thoracic ascending aortic aneurysm [facility])     SURGICAL HISTORY: Past Surgical History:  Procedure Laterality Date   ABDOMINAL HYSTERECTOMY     BREAST BIOPSY Right 03/2013   BREAST BIOPSY Right 05/20/2023   MM RT BREAST BX W LOC DEV 1ST LESION IMAGE BX SPEC STEREO GUIDE 05/20/2023 [facility]   BREAST BIOPSY Right 05/30/2023   MM RT RADIO FREQUENCY  TAG LOC MAMMO GUIDE 05/30/2023 [facility]   BREAST LUMPECTOMY Right 05/31/2023   BREAST LUMPECTOMY,RADIO FREQ LOCALIZER,AXILLARY SENTINEL LYMPH NODE BIOPSY Right 05/30/2023   savi placement   CARPAL TUNNEL RELEASE Right    COLONOSCOPY     COLONOSCOPY WITH PROPOFOL  N/A 03/01/2015   Procedure: COLONOSCOPY WITH PROPOFOL ;  Surgeon: Rogelia Copping, MD;  Location: [facility];  Service: Endoscopy;  Laterality: N/A;   KNEE ARTHROSCOPY Right    KNEE ARTHROSCOPY WITH MEDIAL MENISECTOMY Right 02/01/2015   Procedure: KNEE ARTHROSCOPY WITH MEDIAL MENISECTOMY;  Surgeon: Lonni Sharps, MD;  Location: [facility];  Service: Orthopedics;  Laterality: Right;   PART MASTECTOMY,RADIO FREQUENCY LOCALIZER,AXILLARY SENTINEL NODE BIOPSY Right 05/31/2023   Procedure: PARTIAL MASTECTOMY,RADIO FREQUENCY LOCALIZER,AXILLARY SENTINEL NODE BIOPSY;  Surgeon: Rodolph Romano, MD;  Location: [facility];  Service: General;  Laterality: Right;   TOTAL HIP ARTHROPLASTY Left 03/16/2024   Procedure: ARTHROPLASTY, HIP, TOTAL, ANTERIOR APPROACH;  Surgeon: Lorelle Hussar, MD;  Location: [facility];  Service: Orthopedics;  Laterality: Left;    SOCIAL HISTORY: Social History   Socioeconomic History   Marital status: Single    Spouse name: Not on file   Number of children: 0   Years of education: Not on file   Highest education level: Not on file  Occupational History   Not on file  Tobacco Use   Smoking status: Former    Current packs/day: 0.00    Average packs/day: 0.3 packs/day for 13.0 years (3.3 ttl pk-yrs)    Types: Cigarettes    Start date: 73    Quit date: 34    Years since quitting: 37.7   Smokeless tobacco: Never  Vaping Use   Vaping status: Never Used  Substance and Sexual Activity   Alcohol use: No   Drug use: No   Sexual activity: Yes    Birth control/protection: Surgical  Other Topics Concern   Not on file  Social History Narrative   lives with granddaughter   Social Drivers of Health    Financial Resource Strain: Not on file  Food Insecurity: No Food Insecurity (03/16/2024)   Hunger Vital Sign    Worried About Running Out of Food in the Last Year: Never true    Ran Out of Food in the Last Year: Never true  Transportation Needs: No Transportation Needs (03/16/2024)   PRAPARE - Administrator, Civil Service (Medical): No    Lack of Transportation (Non-Medical): No  Physical Activity: Not on file  Stress: Not on file  Social Connections: Moderately Integrated (03/16/2024)   Social Connection and Isolation Panel    Frequency of Communication with Friends and Family: More than three times a week    Frequency of Social Gatherings with Friends and Family: More than three times a week    Attends Religious Services: More than 4 times per year    Active Member of [employer] or Organizations: Yes    Attends Engineer, structural: More than 4 times per year    Marital Status: Divorced  Intimate Partner Violence: Not At Risk (03/16/2024)   Humiliation, Afraid, Rape, and Kick questionnaire    Fear of Current or Ex-Partner: No    Emotionally Abused: No    Physically Abused: No    Sexually Abused: No    FAMILY HISTORY: Family History  Problem Relation Age of Onset   Heart attack Mother    Breast cancer Sister 72   Multiple myeloma Brother    Breast cancer Other        40's    ALLERGIES:  has no known allergies.  MEDICATIONS:  Current Outpatient Medications  Medication Sig Dispense Refill   acetaminophen  (TYLENOL ) 500 MG tablet Take 2 tablets (1,000 mg total) by mouth every 8 (eight) hours. 30 tablet 0   amLODipine  (NORVASC ) 10 MG tablet Take 10 mg by mouth daily.     atorvastatin  (LIPITOR) 20 MG tablet Take 20 mg by mouth at bedtime.     cyclobenzaprine  (FLEXERIL ) 10 MG tablet Take 10 mg by mouth at bedtime as needed for muscle spasms.     docusate sodium  (COLACE) 100  MG capsule Take 1 capsule (100 mg total) by mouth 2 (two) times daily. 10 capsule 0    gabapentin  (NEURONTIN ) 100 MG capsule Take 100 mg by mouth daily as needed (pain).     Iron-Vitamins (GERITOL COMPLETE PO) Take 1 tablet by mouth daily.     KLOR-CON 20 MEQ packet Take 20 mEq by mouth daily.     lidocaine  (LIDODERM ) 5 % Place 1 patch onto the skin daily as needed (pain).     losartan  (COZAAR ) 50 MG tablet Take 1 tablet (50 mg total) by mouth daily. 60 tablet 3   traMADol  (ULTRAM ) 50 MG tablet Take 1 tablet (50 mg total) by mouth every 6 (six) hours as needed for moderate pain (pain score 4-6). 30 tablet 0   TURMERIC-GINGER PO Take 2 each by mouth daily.     anastrozole  (ARIMIDEX ) 1 MG tablet Take 1 tablet (1 mg total) by mouth daily. 90 tablet 1   apixaban  (ELIQUIS ) 2.5 MG TABS tablet Take 1 tablet (2.5 mg total) by mouth every 12 (twelve) hours for 28 days. 56 tablet 0   ondansetron  (ZOFRAN ) 4 MG tablet Take 1 tablet (4 mg total) by mouth every 6 (six) hours as needed for nausea. 20 tablet 0   oxyCODONE  (ROXICODONE ) 5 MG immediate release tablet Take 0.5 tablets (2.5 mg total) by mouth every 8 (eight) hours as needed for breakthrough pain. 10 tablet 0   No current facility-administered medications for this visit.   Facility-Administered Medications Ordered in Other Visits  Medication Dose Route Frequency Provider Last Rate Last Admin   dexamethasone  (DECADRON ) injection   Intravenous Anesthesia Intra-op Archambo, Courtney, CRNA   10 mg at 05/31/23 1238   dexmedetomidine  (PRECEDEX ) 80 MCG/20ML   Intravenous Anesthesia Intra-op Archambo, Courtney, CRNA   4 mcg at 05/31/23 1219   fentaNYL  (SUBLIMAZE ) injection   Intravenous Anesthesia Intra-op Archambo, Courtney, CRNA   50 mcg at 05/31/23 1321   lidocaine  (cardiac) 100 mg/25mL (XYLOCAINE ) injection 2%   Intravenous Anesthesia Intra-op Archambo, Courtney, CRNA   80 mg at 05/31/23 1135   midazolam  (VERSED ) injection   Intravenous Anesthesia Intra-op Archambo, Courtney, CRNA   1 mg at 05/31/23 1129   ondansetron  (ZOFRAN ) injection    Intravenous Anesthesia Intra-op Archambo, Courtney, CRNA   4 mg at 05/31/23 1238   propofol  (DIPRIVAN ) 10 mg/mL bolus/IV push   Intravenous Anesthesia Intra-op Archambo, Courtney, CRNA   150 mg at 05/31/23 1135   rocuronium  (ZEMURON ) injection   Intravenous Anesthesia Intra-op Archambo, Courtney, CRNA   50 mg at 05/31/23 1135   sugammadex  sodium (BRIDION ) injection   Intravenous Anesthesia Intra-op Archambo, Courtney, CRNA   161.4 mg at 05/31/23 1307    Review of Systems  Constitutional:  Negative for appetite change, chills, fatigue and fever.  HENT:   Negative for hearing loss and voice change.   Eyes:  Negative for eye problems.  Respiratory:  Negative for chest tightness and cough.   Cardiovascular:  Negative for chest pain.  Gastrointestinal:  Negative for abdominal distention, abdominal pain and blood in stool.  Endocrine: Negative for hot flashes.  Genitourinary:  Negative for difficulty urinating and frequency.   Musculoskeletal:  Negative for arthralgias.  Skin:  Negative for itching and rash.  Neurological:  Negative for extremity weakness.  Hematological:  Negative for adenopathy.  Psychiatric/Behavioral:  Negative for confusion.      PHYSICAL EXAMINATION: ECOG PERFORMANCE STATUS: 0 - Asymptomatic  Vitals:   05/21/24 1022  BP: (!) 168/88  Pulse:  72  Resp: 20  Temp: 98.6 F (37 C)  SpO2: 100%   Filed Weights   05/21/24 1022  Weight: 170 lb 11.2 oz (77.4 kg)     Physical Exam Constitutional:      General: She is not in acute distress.    Appearance: She is not diaphoretic.  HENT:     Head: Normocephalic.  Eyes:     General: No scleral icterus. Cardiovascular:     Rate and Rhythm: Normal rate.  Pulmonary:     Effort: Pulmonary effort is normal. No respiratory distress.  Abdominal:     General: There is no distension.  Musculoskeletal:        General: Normal range of motion.     Cervical back: Normal range of motion and neck supple.     Comments: Right  upper extremity swelling and tenderness completely resolved.   Skin:    Findings: No rash.  Neurological:     Mental Status: She is alert and oriented to person, place, and time. Mental status is at baseline.     Cranial Nerves: No cranial nerve deficit.     Motor: No abnormal muscle tone.  Psychiatric:        Mood and Affect: Affect normal.      LABORATORY DATA:  I have reviewed the data as listed    Latest Ref Rng & Units 05/21/2024   10:05 AM 03/17/2024    5:22 AM 02/18/2024    3:01 PM  CBC  WBC 4.0 - 10.5 K/uL 6.8  12.4  8.0   Hemoglobin 12.0 - 15.0 g/dL 88.3  88.3  86.8   Hematocrit 36.0 - 46.0 % 37.8  35.7  41.3   Platelets 150 - 400 K/uL 247  199  218       Latest Ref Rng & Units 05/21/2024   10:05 AM 03/17/2024    5:22 AM 02/18/2024    3:01 PM  CMP  Glucose 70 - 99 mg/dL 84  855  87   BUN 8 - 23 mg/dL 13  20  14    Creatinine 0.44 - 1.00 mg/dL 9.16  9.16  9.15   Sodium 135 - 145 mmol/L 138  140  143   Potassium 3.5 - 5.1 mmol/L 3.6  4.0  3.7   Chloride 98 - 111 mmol/L 109  110  109   CO2 22 - 32 mmol/L 23  24  27    Calcium  8.9 - 10.3 mg/dL 9.5  89.4  88.8   Total Protein 6.5 - 8.1 g/dL 7.0   7.4   Total Bilirubin 0.0 - 1.2 mg/dL 0.8   0.8   Alkaline Phos 38 - 126 U/L 122   107   AST 15 - 41 U/L 24   20   ALT 0 - 44 U/L 17   20      RADIOGRAPHIC STUDIES: I have personally reviewed the radiological images as listed and agreed with the findings in the report. MM 3D DIAGNOSTIC MAMMOGRAM BILATERAL BREAST Result Date: 04/28/2024 CLINICAL DATA:  Status post RIGHT lumpectomy and radiation in September 2024 for ductal carcinoma in-situ. Negative margins and negative axillary lymph nodes. EXAM: DIGITAL DIAGNOSTIC BILATERAL MAMMOGRAM WITH TOMOSYNTHESIS AND CAD TECHNIQUE: Bilateral digital diagnostic mammography and breast tomosynthesis was performed. The images were evaluated with computer-aided detection. COMPARISON:  Previous exam(s). ACR Breast Density Category b: There are  scattered areas of fibroglandular density. FINDINGS: There is density and architectural distortion within the RIGHT breast, consistent with postsurgical  changes. These are stable in comparison to prior. No suspicious mass, distortion, or microcalcifications are identified to suggest presence of malignancy. IMPRESSION: No mammographic evidence of malignancy bilaterally. RECOMMENDATION: Recommend bilateral diagnostic mammogram (with RIGHT and LEFT breast ultrasound if deemed necessary) in 1 year. I have discussed the findings and recommendations with the patient. If applicable, a reminder letter will be sent to the patient regarding the next appointment. BI-RADS CATEGORY  2: Benign. Electronically Signed   By: Corean Salter M.D.   On: 04/28/2024 11:52

## 2024-05-21 NOTE — Assessment & Plan Note (Addendum)
 Right breast DCIS s/p lumpectomy and SLNB. ER 95% positive, status postlumpectomy and adjuvant radiation.  On Arimidex  1 mg daily. She tolerates well.  Continue current regimen.  Recommend calcium  and vitamin D supplementation. Annual mammogram, through Dr. Boone office

## 2024-06-05 ENCOUNTER — Other Ambulatory Visit: Payer: Self-pay | Admitting: Orthopedic Surgery

## 2024-06-15 ENCOUNTER — Other Ambulatory Visit: Payer: Self-pay

## 2024-06-15 ENCOUNTER — Encounter
Admission: RE | Admit: 2024-06-15 | Discharge: 2024-06-15 | Disposition: A | Discharge status: Home / Self Care | Source: Ambulatory Visit | Attending: Orthopedic Surgery | Admitting: Orthopedic Surgery

## 2024-06-15 DIAGNOSIS — Z01818 Encounter for other preprocedural examination: Secondary | ICD-10-CM

## 2024-06-15 DIAGNOSIS — Z01812 Encounter for preprocedural laboratory examination: Secondary | ICD-10-CM | POA: Insufficient documentation

## 2024-06-15 LAB — URINALYSIS, ROUTINE W REFLEX MICROSCOPIC
Bilirubin Urine: NEGATIVE
Glucose, UA: NEGATIVE mg/dL
Ketones, ur: NEGATIVE mg/dL
Leukocytes,Ua: NEGATIVE
Nitrite: NEGATIVE
Protein, ur: NEGATIVE mg/dL
Specific Gravity, Urine: 1.018 (ref 1.005–1.030)
pH: 6 (ref 5.0–8.0)

## 2024-06-15 LAB — SURGICAL PCR SCREEN
MRSA, PCR: NEGATIVE
Staphylococcus aureus: NEGATIVE

## 2024-06-15 NOTE — Patient Instructions (Addendum)
 Your procedure is scheduled on: Monday 06/22/24 Report to the Registration Desk on the 1st floor of the [facility]. To find out your arrival time, please call [phone] between 1PM - 3PM on: Friday 06/19/24 If your arrival time is 6:00 am, do not arrive before that time as the [facility] entrance doors do not open until 6:00 am.  REMEMBER: Instructions that are not followed completely may result in serious medical risk, up to and including death; or upon the discretion of your surgeon and anesthesiologist your surgery may need to be rescheduled.  Do not eat food after midnight the night before surgery.  No gum chewing or hard candies.  You may however, drink CLEAR liquids up to 2 hours before you are scheduled to arrive for your surgery. Do not drink anything within 2 hours of your scheduled arrival time.  Clear liquids include: - water   - apple juice without pulp - gatorade (not RED colors) - black coffee or tea (Do NOT add milk or creamers to the coffee or tea) Do NOT drink anything that is not on this list.  **Type 1 and Type 2 diabetics should only drink water .**  In addition, your doctor has ordered for you to drink the provided:  Ensure Pre-Surgery Clear Carbohydrate Drink  Drinking this carbohydrate drink up to two hours before surgery helps to reduce insulin resistance and improve patient outcomes. Please complete drinking 2 hours before scheduled arrival time.  One week prior to surgery: Stop Anti-inflammatories (NSAIDS) such as Advil, Aleve , Ibuprofen, Motrin, Naproxen , Naprosyn  and Aspirin based products such as Excedrin, Goody's Powder, BC Powder.  You may however, continue to take Tylenol  if needed for pain up until the day of surgery.  Stop ANY OVER THE COUNTER supplements and vitamins until after surgery.  Continue taking all of your other prescription medications up until the day of surgery.  ON THE DAY OF SURGERY ONLY TAKE THESE MEDICATIONS WITH SIPS OF  WATER :  anastrozole   Use inhalers on the day of surgery and bring to the hospital.  No Alcohol for 24 hours before or after surgery.  No Smoking including e-cigarettes for 24 hours before surgery.  No chewable tobacco products for at least 6 hours before surgery.  No nicotine patches on the day of surgery.  Do not use any recreational drugs for at least a week (preferably 2 weeks) before your surgery.  Please be advised that the combination of cocaine and anesthesia may have negative outcomes, up to and including death. If you test positive for cocaine, your surgery will be cancelled.  On the morning of surgery brush your teeth with toothpaste and water , you may rinse your mouth with mouthwash if you wish. Do not swallow any toothpaste or mouthwash.  Use CHG Soap or wipes as directed on instruction sheet.  Do not wear lotions, powders, or perfumes.   Do not shave body hair from the neck down 48 hours before surgery.  Wear comfortable clothing (specific to your surgery type) to the hospital.  Do not wear jewelry, make-up, hairpins, clips or nail polish.  For welded (permanent) jewelry: bracelets, anklets, waist bands, etc.  Please have this removed prior to surgery.  If it is not removed, there is a chance that hospital personnel will need to cut it off on the day of surgery.  Contact lenses, hearing aids and dentures may not be worn into surgery.  Do not bring valuables to the hospital. [facility] is not responsible for any missing/lost  belongings or valuables.   Notify your doctor if there is any change in your medical condition (cold, fever, infection).  After surgery, you can help prevent lung complications by doing breathing exercises.  Take deep breaths and cough every 1-2 hours. Your doctor may order a device called an Incentive Spirometer to help you take deep breaths.  If you are being admitted to the hospital overnight, leave your suitcase in the car. After surgery  it may be brought to your room.  In case of increased patient census, it may be necessary for you, the patient, to continue your postoperative care in the Same Day Surgery department.  Please call the Pre-admissions Testing Dept. at [phone] if you have any questions about these instructions.  Surgery Visitation Policy:  Patients having surgery or a procedure may have two visitors.  Children under the age of 22 must have an adult with them who is not the patient.  Inpatient Visitation:    Visiting hours are 7 a.m. to 8 p.m. Up to four visitors are allowed at one time in a patient room. The visitors may rotate out with other people during the day.  One visitor age 17 or older may stay with the patient overnight and must be in the room by 8 p.m.   Merchandiser, retail to address health-related social needs:  [email or web address]    Pre-operative 4 CHG Bath Instructions   You can play a key role in reducing the risk of infection after surgery. Your skin needs to be as free of germs as possible. You can reduce the number of germs on your skin by washing with CHG (chlorhexidine  gluconate) soap before surgery. CHG is an antiseptic soap that kills germs and continues to kill germs even after washing.   DO NOT use if you have an allergy to chlorhexidine /CHG or antibacterial soaps. If your skin becomes reddened or irritated, stop using the CHG and notify one of our RNs at [phone].   Please shower with the CHG soap starting 4 days before surgery using the following schedule:   Thursday 06/18/24 - Sunday 06/21/24    Please keep in mind the following:  DO NOT shave, including legs and underarms, starting the day of your first shower.   You may shave your face at any point before/day of surgery.  Place clean sheets on your bed the day you start using CHG soap. Use a clean washcloth (not used since being washed) for each shower. DO NOT sleep with pets once you  start using the CHG.   CHG Shower Instructions:  If you choose to wash your hair and private area, wash first with your normal shampoo/soap.  After you use shampoo/soap, rinse your hair and body thoroughly to remove shampoo/soap residue.  Turn the water  OFF and apply about 3 tablespoons (45 ml) of CHG soap to a CLEAN washcloth.  Apply CHG soap ONLY FROM YOUR NECK DOWN TO YOUR TOES (washing for 3-5 minutes)  DO NOT use CHG soap on face, private areas, open wounds, or sores.  Pay special attention to the area where your surgery is being performed.  If you are having back surgery, having someone wash your back for you may be helpful. Wait 2 minutes after CHG soap is applied, then you may rinse off the CHG soap.  Pat dry with a clean towel  Put on clean clothes/pajamas   If you choose to wear lotion, please use ONLY the CHG-compatible lotions on the back of this paper.  Additional instructions for the day of surgery: DO NOT APPLY any lotions, deodorants, cologne, or perfumes.   Put on clean/comfortable clothes.  Brush your teeth.  Ask your nurse before applying any prescription medications to the skin.      CHG Compatible Lotions   Aveeno Moisturizing lotion  Cetaphil Moisturizing Cream  Cetaphil Moisturizing Lotion  Clairol Herbal Essence Moisturizing Lotion, Dry Skin  Clairol Herbal Essence Moisturizing Lotion, Extra Dry Skin  Clairol Herbal Essence Moisturizing Lotion, Normal Skin  Curel Age Defying Therapeutic Moisturizing Lotion with Alpha Hydroxy  Curel Extreme Care Body Lotion  Curel Soothing Hands Moisturizing Hand Lotion  Curel Therapeutic Moisturizing Cream, Fragrance-Free  Curel Therapeutic Moisturizing Lotion, Fragrance-Free  Curel Therapeutic Moisturizing Lotion, Original Formula  Eucerin Daily Replenishing Lotion  Eucerin Dry Skin Therapy Plus Alpha Hydroxy Crme  Eucerin Dry Skin Therapy Plus Alpha Hydroxy Lotion  Eucerin Original Crme  Eucerin Original  Lotion  Eucerin Plus Crme Eucerin Plus Lotion  Eucerin TriLipid Replenishing Lotion  Keri Anti-Bacterial Hand Lotion  Keri Deep Conditioning Original Lotion Dry Skin Formula Softly Scented  Keri Deep Conditioning Original Lotion, Fragrance Free Sensitive Skin Formula  Keri Lotion Fast Absorbing Fragrance Free Sensitive Skin Formula  Keri Lotion Fast Absorbing Softly Scented Dry Skin Formula  Keri Original Lotion  Keri Skin Renewal Lotion Keri Silky Smooth Lotion  Keri Silky Smooth Sensitive Skin Lotion  Nivea Body Creamy Conditioning Oil  Nivea Body Extra Enriched Lotion  Nivea Body Original Lotion  Nivea Body Sheer Moisturizing Lotion Nivea Crme  Nivea Skin Firming Lotion  NutraDerm 30 Skin Lotion  NutraDerm Skin Lotion  NutraDerm Therapeutic Skin Cream  NutraDerm Therapeutic Skin Lotion  ProShield Protective Hand Cream  Provon moisturizing lotion  How to Use an Incentive Spirometer  An incentive spirometer is a tool that measures how well you are filling your lungs with each breath. Learning to take long, deep breaths using this tool can help you keep your lungs clear and active. This may help to reverse or lessen your chance of developing breathing (pulmonary) problems, especially infection. You may be asked to use a spirometer: After a surgery. If you have a lung problem or a history of smoking. After a long period of time when you have been unable to move or be active. If the spirometer includes an indicator to show the highest number that you have reached, your health care provider or respiratory therapist will help you set a goal. Keep a log of your progress as told by your health care provider. What are the risks? Breathing too quickly may cause dizziness or cause you to pass out. Take your time so you do not get dizzy or light-headed. If you are in pain, you may need to take pain medicine before doing incentive spirometry. It is harder to take a deep breath if you are  having pain. How to use your incentive spirometer  Sit up on the edge of your bed or on a chair. Hold the incentive spirometer so that it is in an upright position. Before you use the spirometer, breathe out normally. Place the mouthpiece in your mouth. Make sure your lips are closed tightly around it. Breathe in slowly and as deeply as you can through your mouth, causing the piston or the ball to rise toward the top of the chamber. Hold your breath for 3-5 seconds, or for as long as possible. If the spirometer includes a coach indicator, use this to guide you in breathing. Slow down your  breathing if the indicator goes above the marked areas. Remove the mouthpiece from your mouth and breathe out normally. The piston or ball will return to the bottom of the chamber. Rest for a few seconds, then repeat the steps 10 or more times. Take your time and take a few normal breaths between deep breaths so that you do not get dizzy or light-headed. Do this every 1-2 hours when you are awake. If the spirometer includes a goal marker to show the highest number you have reached (best effort), use this as a goal to work toward during each repetition. After each set of 10 deep breaths, cough a few times. This will help to make sure that your lungs are clear. If you have an incision on your chest or abdomen from surgery, place a pillow or a rolled-up towel firmly against the incision when you cough. This can help to reduce pain while taking deep breaths and coughing. General tips When you are able to get out of bed: Walk around often. Continue to take deep breaths and cough in order to clear your lungs. Keep using the incentive spirometer until your health care provider says it is okay to stop using it. If you have been in the hospital, you may be told to keep using the spirometer at home. Contact a health care provider if: You are having difficulty using the spirometer. You have trouble using the spirometer  as often as instructed. Your pain medicine is not giving enough relief for you to use the spirometer as told. You have a fever. Get help right away if: You develop shortness of breath. You develop a cough with bloody mucus from the lungs. You have fluid or blood coming from an incision site after you cough. Summary An incentive spirometer is a tool that can help you learn to take long, deep breaths to keep your lungs clear and active. You may be asked to use a spirometer after a surgery, if you have a lung problem or a history of smoking, or if you have been inactive for a long period of time. Use your incentive spirometer as instructed every 1-2 hours while you are awake. If you have an incision on your chest or abdomen, place a pillow or a rolled-up towel firmly against your incision when you cough. This will help to reduce pain. Get help right away if you have shortness of breath, you cough up bloody mucus, or blood comes from your incision when you cough. This information is not intended to replace advice given to you by your health care provider. Make sure you discuss any questions you have with your health care provider. Document Revised: 11/09/2019 Document Reviewed: 11/09/2019 Elsevier Patient Education  2023 [employer].

## 2024-06-22 ENCOUNTER — Ambulatory Visit: Payer: Self-pay | Admitting: Urgent Care

## 2024-06-22 ENCOUNTER — Other Ambulatory Visit: Payer: Self-pay

## 2024-06-22 ENCOUNTER — Ambulatory Visit

## 2024-06-22 ENCOUNTER — Encounter: Admission: RE | Disposition: A | Payer: Self-pay | Source: Home / Self Care | Attending: Orthopedic Surgery

## 2024-06-22 ENCOUNTER — Ambulatory Visit
Admission: RE | Admit: 2024-06-22 | Discharge: 2024-06-23 | Disposition: A | Discharge status: Home Health | Attending: Orthopedic Surgery | Admitting: Orthopedic Surgery

## 2024-06-22 ENCOUNTER — Encounter: Payer: Self-pay | Admitting: Orthopedic Surgery

## 2024-06-22 DIAGNOSIS — I7121 Aneurysm of the ascending aorta, without rupture: Secondary | ICD-10-CM

## 2024-06-22 DIAGNOSIS — Z7983 Long term (current) use of bisphosphonates: Secondary | ICD-10-CM | POA: Diagnosis not present

## 2024-06-22 DIAGNOSIS — I10 Essential (primary) hypertension: Secondary | ICD-10-CM | POA: Diagnosis not present

## 2024-06-22 DIAGNOSIS — Z79811 Long term (current) use of aromatase inhibitors: Secondary | ICD-10-CM | POA: Diagnosis not present

## 2024-06-22 DIAGNOSIS — Z79899 Other long term (current) drug therapy: Secondary | ICD-10-CM | POA: Diagnosis not present

## 2024-06-22 DIAGNOSIS — Z01818 Encounter for other preprocedural examination: Secondary | ICD-10-CM

## 2024-06-22 DIAGNOSIS — Z87891 Personal history of nicotine dependence: Secondary | ICD-10-CM | POA: Diagnosis not present

## 2024-06-22 DIAGNOSIS — M160 Bilateral primary osteoarthritis of hip: Secondary | ICD-10-CM | POA: Insufficient documentation

## 2024-06-22 DIAGNOSIS — Z7901 Long term (current) use of anticoagulants: Secondary | ICD-10-CM | POA: Insufficient documentation

## 2024-06-22 DIAGNOSIS — M25551 Pain in right hip: Secondary | ICD-10-CM | POA: Diagnosis present

## 2024-06-22 DIAGNOSIS — Z96641 Presence of right artificial hip joint: Secondary | ICD-10-CM | POA: Diagnosis present

## 2024-06-22 DIAGNOSIS — I7143 Infrarenal abdominal aortic aneurysm, without rupture: Secondary | ICD-10-CM

## 2024-06-22 HISTORY — PX: TOTAL HIP ARTHROPLASTY: SHX124

## 2024-06-22 SURGERY — ARTHROPLASTY, HIP, TOTAL, ANTERIOR APPROACH
Anesthesia: Spinal | Site: Hip | Laterality: Right

## 2024-06-22 MED ORDER — ACETAMINOPHEN 10 MG/ML IV SOLN
INTRAVENOUS | Status: AC
Start: 2024-06-22 — End: 2024-06-22
  Filled 2024-06-22: qty 100

## 2024-06-22 MED ORDER — SODIUM CHLORIDE (PF) 0.9 % IJ SOLN: INTRAMUSCULAR | Status: DC | PRN

## 2024-06-22 MED ORDER — SURGIPHOR WOUND IRRIGATION SYSTEM - OPTIME
TOPICAL | Status: DC | PRN
  Administered 2024-06-22: 450 mL

## 2024-06-22 MED ORDER — TRAMADOL HCL 50 MG PO TABS
50.0000 mg | ORAL_TABLET | Freq: Four times a day (QID) | ORAL | 0 refills | Status: AC | PRN
  Filled 2024-06-22: qty 30, 8d supply, fill #0

## 2024-06-22 MED ORDER — LIDOCAINE HCL (PF) 2 % IJ SOLN
INTRAMUSCULAR | Status: AC
  Filled 2024-06-22: qty 5

## 2024-06-22 MED ORDER — SODIUM CHLORIDE 0.9 % IV SOLN: INTRAVENOUS | Status: DC

## 2024-06-22 MED ORDER — ACETAMINOPHEN 10 MG/ML IV SOLN
INTRAVENOUS | Status: DC | PRN
Start: 2024-06-22 — End: 2024-06-22
  Administered 2024-06-22: 1000 mg via INTRAVENOUS

## 2024-06-22 MED ORDER — ONDANSETRON HCL 4 MG PO TABS: 4.0000 mg | ORAL_TABLET | Freq: Four times a day (QID) | ORAL | Status: DC | PRN

## 2024-06-22 MED ORDER — APIXABAN 2.5 MG PO TABS
2.5000 mg | ORAL_TABLET | Freq: Two times a day (BID) | ORAL | 0 refills | Status: AC
  Filled 2024-06-22: qty 56, 28d supply, fill #0

## 2024-06-22 MED ORDER — BUPIVACAINE HCL (PF) 0.5 % IJ SOLN
INTRAMUSCULAR | Status: AC
  Filled 2024-06-22: qty 10

## 2024-06-22 MED ORDER — BUPIVACAINE-EPINEPHRINE (PF) 0.25% -1:200000 IJ SOLN
20.0000 mL | Freq: Once | INTRAMUSCULAR | Status: AC
  Administered 2024-06-22: 20 mL
  Filled 2024-06-22: qty 20

## 2024-06-22 MED ORDER — PHENOL 1.4 % MT LIQD: 1.0000 | OROMUCOSAL | Status: DC | PRN

## 2024-06-22 MED ORDER — PROPOFOL 1000 MG/100ML IV EMUL
INTRAVENOUS | Status: AC
  Filled 2024-06-22: qty 100

## 2024-06-22 MED ORDER — GLYCOPYRROLATE 0.2 MG/ML IJ SOLN
INTRAMUSCULAR | Status: DC | PRN
  Administered 2024-06-22: .2 mg via INTRAVENOUS

## 2024-06-22 MED ORDER — DEXAMETHASONE SOD PHOSPHATE PF 10 MG/ML IJ SOLN
8.0000 mg | Freq: Once | INTRAMUSCULAR | Status: AC
  Administered 2024-06-22: 10 mg via INTRAVENOUS

## 2024-06-22 MED ORDER — MIDAZOLAM HCL 2 MG/2ML IJ SOLN
INTRAMUSCULAR | Status: AC
  Filled 2024-06-22: qty 2

## 2024-06-22 MED ORDER — MENTHOL 3 MG MT LOZG: 1.0000 | LOZENGE | OROMUCOSAL | Status: DC | PRN

## 2024-06-22 MED ORDER — FENTANYL CITRATE (PF) 100 MCG/2ML IJ SOLN: 25.0000 ug | INTRAMUSCULAR | Status: DC | PRN

## 2024-06-22 MED ORDER — MIDAZOLAM HCL 5 MG/5ML IJ SOLN
INTRAMUSCULAR | Status: DC | PRN
  Administered 2024-06-22 (×2): 1 mg via INTRAVENOUS

## 2024-06-22 MED ORDER — CEFAZOLIN SODIUM-DEXTROSE 2-4 GM/100ML-% IV SOLN
INTRAVENOUS | Status: AC
  Filled 2024-06-22: qty 100

## 2024-06-22 MED ORDER — LOSARTAN POTASSIUM 50 MG PO TABS
50.0000 mg | ORAL_TABLET | Freq: Every day | ORAL | Status: DC
  Administered 2024-06-22 – 2024-06-23 (×2): 50 mg via ORAL
  Filled 2024-06-22 (×2): qty 1

## 2024-06-22 MED ORDER — APIXABAN 2.5 MG PO TABS
2.5000 mg | ORAL_TABLET | Freq: Two times a day (BID) | ORAL | Status: DC
  Administered 2024-06-23: 2.5 mg via ORAL
  Filled 2024-06-22: qty 1

## 2024-06-22 MED ORDER — SODIUM CHLORIDE (PF) 0.9 % IJ SOLN
INTRAMUSCULAR | Status: AC
  Filled 2024-06-22: qty 10

## 2024-06-22 MED ORDER — METOCLOPRAMIDE HCL 10 MG PO TABS: 5.0000 mg | ORAL_TABLET | Freq: Three times a day (TID) | ORAL | Status: DC | PRN

## 2024-06-22 MED ORDER — MORPHINE SULFATE (PF) 2 MG/ML IV SOLN: 0.5000 mg | INTRAVENOUS | Status: DC | PRN

## 2024-06-22 MED ORDER — DEXMEDETOMIDINE HCL IN NACL 80 MCG/20ML IV SOLN
INTRAVENOUS | Status: DC | PRN
  Administered 2024-06-22 (×3): 4 ug via INTRAVENOUS

## 2024-06-22 MED ORDER — ONDANSETRON HCL 4 MG/2ML IJ SOLN
INTRAMUSCULAR | Status: AC
Start: 2024-06-22 — End: 2024-06-22
  Filled 2024-06-22: qty 2

## 2024-06-22 MED ORDER — HYDROCODONE-ACETAMINOPHEN 5-325 MG PO TABS: 1.0000 | ORAL_TABLET | ORAL | Status: DC | PRN

## 2024-06-22 MED ORDER — FENTANYL CITRATE (PF) 100 MCG/2ML IJ SOLN
INTRAMUSCULAR | Status: AC
  Filled 2024-06-22: qty 2

## 2024-06-22 MED ORDER — TRANEXAMIC ACID-NACL 1000-0.7 MG/100ML-% IV SOLN
1000.0000 mg | INTRAVENOUS | Status: AC
  Administered 2024-06-22 (×2): 1000 mg via INTRAVENOUS

## 2024-06-22 MED ORDER — TRAMADOL HCL 50 MG PO TABS
50.0000 mg | ORAL_TABLET | Freq: Four times a day (QID) | ORAL | Status: DC | PRN
  Administered 2024-06-22: 50 mg via ORAL
  Filled 2024-06-22: qty 1

## 2024-06-22 MED ORDER — BUPIVACAINE-EPINEPHRINE (PF) 0.25% -1:200000 IJ SOLN
INTRAMUSCULAR | Status: DC | PRN
  Administered 2024-06-22: 20 mL

## 2024-06-22 MED ORDER — INFLUENZA VAC SPLIT HIGH-DOSE 0.5 ML IM SUSY
0.5000 mL | PREFILLED_SYRINGE | INTRAMUSCULAR | Status: DC
Start: 2024-06-23 — End: 2024-06-23
  Filled 2024-06-22: qty 0.5

## 2024-06-22 MED ORDER — METOCLOPRAMIDE HCL 5 MG/ML IJ SOLN: 5.0000 mg | Freq: Three times a day (TID) | INTRAMUSCULAR | Status: DC | PRN

## 2024-06-22 MED ORDER — LIDOCAINE HCL (PF) 2 % IJ SOLN
INTRAMUSCULAR | Status: AC
Start: 2024-06-22 — End: 2024-06-22
  Filled 2024-06-22: qty 5

## 2024-06-22 MED ORDER — PHENYLEPHRINE 80 MCG/ML (10ML) SYRINGE FOR IV PUSH (FOR BLOOD PRESSURE SUPPORT)
PREFILLED_SYRINGE | INTRAVENOUS | Status: AC
  Filled 2024-06-22: qty 10

## 2024-06-22 MED ORDER — ACETAMINOPHEN 325 MG PO TABS: 325.0000 mg | ORAL_TABLET | Freq: Four times a day (QID) | ORAL | Status: DC | PRN

## 2024-06-22 MED ORDER — ORAL CARE MOUTH RINSE: 15.0000 mL | Freq: Once | OROMUCOSAL | Status: AC

## 2024-06-22 MED ORDER — BUPIVACAINE-EPINEPHRINE (PF) 0.25% -1:200000 IJ SOLN
INTRAMUSCULAR | Status: AC
Start: 2024-06-22 — End: 2024-06-22
  Filled 2024-06-22: qty 30

## 2024-06-22 MED ORDER — ACETAMINOPHEN 500 MG PO TABS
1000.0000 mg | ORAL_TABLET | Freq: Three times a day (TID) | ORAL | Status: DC
  Administered 2024-06-22 – 2024-06-23 (×3): 1000 mg via ORAL
  Filled 2024-06-22 (×3): qty 2

## 2024-06-22 MED ORDER — CEFAZOLIN SODIUM-DEXTROSE 2-4 GM/100ML-% IV SOLN
2.0000 g | Freq: Four times a day (QID) | INTRAVENOUS | Status: AC
  Administered 2024-06-22 (×2): 2 g via INTRAVENOUS
  Filled 2024-06-22 (×2): qty 100

## 2024-06-22 MED ORDER — BUPIVACAINE LIPOSOME 1.3 % IJ SUSP
INTRAMUSCULAR | Status: AC
  Filled 2024-06-22: qty 20

## 2024-06-22 MED ORDER — ONDANSETRON HCL 4 MG/2ML IJ SOLN
INTRAMUSCULAR | Status: DC | PRN
  Administered 2024-06-22: 4 mg via INTRAVENOUS

## 2024-06-22 MED ORDER — CHLORHEXIDINE GLUCONATE 0.12 % MT SOLN
OROMUCOSAL | Status: AC
Start: 2024-06-22 — End: 2024-06-22
  Filled 2024-06-22: qty 15

## 2024-06-22 MED ORDER — BUPIVACAINE-EPINEPHRINE 0.25% -1:200000 IJ SOLN: 20.0000 mL | Freq: Once | INTRAMUSCULAR | Status: DC

## 2024-06-22 MED ORDER — PNEUMOCOCCAL 20-VAL CONJ VACC 0.5 ML IM SUSY: 0.5000 mL | PREFILLED_SYRINGE | INTRAMUSCULAR | Status: DC

## 2024-06-22 MED ORDER — PROPOFOL 500 MG/50ML IV EMUL
INTRAVENOUS | Status: DC | PRN
  Administered 2024-06-22: 50 ug/kg/min via INTRAVENOUS

## 2024-06-22 MED ORDER — BUPIVACAINE HCL (PF) 0.5 % IJ SOLN
INTRAMUSCULAR | Status: DC | PRN
  Administered 2024-06-22: 3 mL via INTRATHECAL

## 2024-06-22 MED ORDER — DOCUSATE SODIUM 100 MG PO CAPS
100.0000 mg | ORAL_CAPSULE | Freq: Two times a day (BID) | ORAL | Status: DC
  Administered 2024-06-23: 100 mg via ORAL
  Filled 2024-06-22 (×2): qty 1

## 2024-06-22 MED ORDER — PANTOPRAZOLE SODIUM 40 MG PO TBEC
40.0000 mg | DELAYED_RELEASE_TABLET | Freq: Every day | ORAL | Status: DC
  Administered 2024-06-23: 40 mg via ORAL
  Filled 2024-06-22 (×2): qty 1

## 2024-06-22 MED ORDER — SODIUM CHLORIDE 0.9 % IV SOLN
INTRAVENOUS | Status: DC | PRN
  Administered 2024-06-22: 20 mL

## 2024-06-22 MED ORDER — CEFAZOLIN SODIUM-DEXTROSE 2-4 GM/100ML-% IV SOLN
2.0000 g | INTRAVENOUS | Status: AC
  Administered 2024-06-22: 2 g via INTRAVENOUS

## 2024-06-22 MED ORDER — PHENYLEPHRINE 80 MCG/ML (10ML) SYRINGE FOR IV PUSH (FOR BLOOD PRESSURE SUPPORT)
PREFILLED_SYRINGE | INTRAVENOUS | Status: DC | PRN
  Administered 2024-06-22 (×4): 80 ug via INTRAVENOUS

## 2024-06-22 MED ORDER — LIDOCAINE HCL (CARDIAC) PF 100 MG/5ML IV SOSY
PREFILLED_SYRINGE | INTRAVENOUS | Status: DC | PRN
  Administered 2024-06-22: 60 mg via INTRAVENOUS

## 2024-06-22 MED ORDER — LACTATED RINGERS IV SOLN
INTRAVENOUS | Status: DC
Start: 2024-06-22 — End: 2024-06-22

## 2024-06-22 MED ORDER — ONDANSETRON HCL 4 MG/2ML IJ SOLN: 4.0000 mg | Freq: Four times a day (QID) | INTRAMUSCULAR | Status: DC | PRN

## 2024-06-22 MED ORDER — ONDANSETRON HCL 4 MG PO TABS
4.0000 mg | ORAL_TABLET | Freq: Four times a day (QID) | ORAL | 0 refills | Status: AC | PRN
  Filled 2024-06-22: qty 20, 5d supply, fill #0

## 2024-06-22 MED ORDER — TRANEXAMIC ACID-NACL 1000-0.7 MG/100ML-% IV SOLN
INTRAVENOUS | Status: AC
Start: 2024-06-22 — End: 2024-06-22
  Filled 2024-06-22: qty 100

## 2024-06-22 MED ORDER — PROPOFOL 10 MG/ML IV BOLUS
INTRAVENOUS | Status: DC | PRN
Start: 2024-06-22 — End: 2024-06-22
  Administered 2024-06-22: 20 mg via INTRAVENOUS

## 2024-06-22 MED ORDER — EPHEDRINE SULFATE-NACL 50-0.9 MG/10ML-% IV SOSY
PREFILLED_SYRINGE | INTRAVENOUS | Status: DC | PRN
  Administered 2024-06-22: 5 mg via INTRAVENOUS

## 2024-06-22 MED ORDER — FENTANYL CITRATE (PF) 100 MCG/2ML IJ SOLN
INTRAMUSCULAR | Status: DC | PRN
  Administered 2024-06-22 (×4): 25 ug via INTRAVENOUS

## 2024-06-22 MED ORDER — ANASTROZOLE 1 MG PO TABS
1.0000 mg | ORAL_TABLET | Freq: Every day | ORAL | Status: DC
Start: 2024-06-23 — End: 2024-06-23
  Administered 2024-06-23: 1 mg via ORAL
  Filled 2024-06-22: qty 1

## 2024-06-22 MED ORDER — TRANEXAMIC ACID-NACL 1000-0.7 MG/100ML-% IV SOLN
INTRAVENOUS | Status: AC
  Filled 2024-06-22: qty 100

## 2024-06-22 MED ORDER — DOCUSATE SODIUM 100 MG PO CAPS
100.0000 mg | ORAL_CAPSULE | Freq: Two times a day (BID) | ORAL | 0 refills | Status: AC
  Filled 2024-06-22: qty 10, 5d supply, fill #0

## 2024-06-22 MED ORDER — KETOROLAC TROMETHAMINE 15 MG/ML IJ SOLN
7.5000 mg | Freq: Four times a day (QID) | INTRAMUSCULAR | Status: AC
  Administered 2024-06-22 – 2024-06-23 (×4): 7.5 mg via INTRAVENOUS
  Filled 2024-06-22 (×4): qty 1

## 2024-06-22 MED ORDER — CHLORHEXIDINE GLUCONATE 0.12 % MT SOLN
15.0000 mL | Freq: Once | OROMUCOSAL | Status: AC
  Administered 2024-06-22: 15 mL via OROMUCOSAL

## 2024-06-22 MED ORDER — ACETAMINOPHEN 500 MG PO TABS
1000.0000 mg | ORAL_TABLET | Freq: Three times a day (TID) | ORAL | 0 refills | Status: AC
  Filled 2024-06-22: qty 30, 5d supply, fill #0

## 2024-06-22 MED ORDER — ATORVASTATIN CALCIUM 10 MG PO TABS
20.0000 mg | ORAL_TABLET | Freq: Every day | ORAL | Status: DC
  Administered 2024-06-22: 20 mg via ORAL
  Filled 2024-06-22: qty 2

## 2024-06-22 MED ORDER — 0.9 % SODIUM CHLORIDE (POUR BTL) OPTIME
TOPICAL | Status: DC | PRN
  Administered 2024-06-22: 500 mL

## 2024-06-22 SURGICAL SUPPLY — 57 items
BLADE CLIPPER SURG (BLADE) IMPLANT
BLADE SAGITTAL AGGR TOOTH XLG (BLADE) ×1 IMPLANT
BNDG COHESIVE 6X5 TAN ST LF (GAUZE/BANDAGES/DRESSINGS) ×2 IMPLANT
BRUSH SCRUB EZ PLAIN DRY (MISCELLANEOUS) ×1 IMPLANT
CHLORAPREP W/TINT 26 (MISCELLANEOUS) ×1 IMPLANT
DERMABOND ADVANCED .7 DNX12 (GAUZE/BANDAGES/DRESSINGS) ×1 IMPLANT
DRAPE C-ARM XRAY 36X54 (DRAPES) ×1 IMPLANT
DRAPE SHEET LG 3/4 BI-LAMINATE (DRAPES) ×2 IMPLANT
DRAPE TABLE BACK 80X90 (DRAPES) ×1 IMPLANT
DRSG MEPILEX SACRM 8.7X9.8 (GAUZE/BANDAGES/DRESSINGS) ×1 IMPLANT
DRSG OPSITE POSTOP 4X8 (GAUZE/BANDAGES/DRESSINGS) ×1 IMPLANT
ELECTRODE BLDE 4.0 EZ CLN MEGD (MISCELLANEOUS) ×1 IMPLANT
ELECTRODE REM PT RTRN 9FT ADLT (ELECTROSURGICAL) ×1 IMPLANT
GLOVE BIO SURGEON STRL SZ8 (GLOVE) ×1 IMPLANT
GLOVE BIOGEL PI IND STRL 8 (GLOVE) ×1 IMPLANT
GLOVE PI ORTHO PRO STRL 7.5 (GLOVE) ×2 IMPLANT
GLOVE PI ORTHO PRO STRL SZ8 (GLOVE) ×2 IMPLANT
GLOVE SURG SYN 7.5 PF PI (GLOVE) ×1 IMPLANT
GOWN SRG XL LONG LVL 3 NONREIN (GOWNS) ×1 IMPLANT
GOWN SRG XL LVL 3 NONREINFORCE (GOWNS) ×1 IMPLANT
GOWN STRL REUS W/ TWL LRG LVL3 (GOWN DISPOSABLE) ×1 IMPLANT
HEAD CERAMIC FEMORAL 36MM (Head) IMPLANT
HOOD PEEL AWAY T7 (MISCELLANEOUS) ×2 IMPLANT
INSERT TRIDENT POLY 36 0DEG (Insert) IMPLANT
IV NS 100ML SINGLE PACK (IV SOLUTION) ×1 IMPLANT
KIT PATIENT CARE HANA TABLE (KITS) ×1 IMPLANT
KIT TURNOVER CYSTO (KITS) ×1 IMPLANT
LIGHT WAVEGUIDE WIDE FLAT (MISCELLANEOUS) ×1 IMPLANT
MANIFOLD NEPTUNE II (INSTRUMENTS) ×1 IMPLANT
MARKER SKIN DUAL TIP RULER LAB (MISCELLANEOUS) ×1 IMPLANT
MAT ABSORB FLUID 56X50 GRAY (MISCELLANEOUS) ×1 IMPLANT
NDL SPNL 20GX3.5 QUINCKE YW (NEEDLE) ×1 IMPLANT
NEEDLE SPNL 20GX3.5 QUINCKE YW (NEEDLE) ×1 IMPLANT
NS IRRIG 500ML POUR BTL (IV SOLUTION) ×1 IMPLANT
PACK HIP COMPR (MISCELLANEOUS) ×1 IMPLANT
PAD ARMBOARD POSITIONER FOAM (MISCELLANEOUS) ×1 IMPLANT
PENCIL SMOKE EVACUATOR (MISCELLANEOUS) ×1 IMPLANT
SCREW HEX LP 6.5X15 (Screw) IMPLANT
SCREW HEX LP 6.5X25 (Screw) IMPLANT
SHELL ACETABUL CLUSTER SZ 54 (Shell) IMPLANT
SLEEVE SCD COMPRESS KNEE MED (STOCKING) ×1 IMPLANT
SOLN STERILE WATER BTL 1000 ML (IV SOLUTION) ×1 IMPLANT
SOLUTION IRRIG SURGIPHOR (IV SOLUTION) ×1 IMPLANT
STEM STD OFFSET SZ5 36 (Stem) IMPLANT
SURGIFLO W/THROMBIN 8M KIT (HEMOSTASIS) IMPLANT
SUT BONE WAX W31G (SUTURE) ×1 IMPLANT
SUT ETHIBOND 2 V 37 (SUTURE) ×1 IMPLANT
SUT SILK 0 30XBRD TIE 6 (SUTURE) ×1 IMPLANT
SUT STRATAFIX 14 PDO 36 VLT (SUTURE) ×1 IMPLANT
SUT VIC AB 0 CT1 36 (SUTURE) ×1 IMPLANT
SUT VIC AB 2-0 CT2 27 (SUTURE) ×1 IMPLANT
SUTURE STRATA SPIR 4-0 18 (SUTURE) ×1 IMPLANT
SYR 20ML LL LF (SYRINGE) ×2 IMPLANT
TAPE MICROFOAM 4IN (TAPE) IMPLANT
TOWEL OR 17X26 4PK STRL BLUE (TOWEL DISPOSABLE) IMPLANT
TRAP FLUID SMOKE EVACUATOR (MISCELLANEOUS) ×1 IMPLANT
WAND WEREWOLF FASTSEAL 6.0 (MISCELLANEOUS) ×1 IMPLANT

## 2024-06-22 NOTE — Anesthesia Procedure Notes (Signed)
 Spinal  Patient location during procedure: OR Start time: 06/22/2024 7:38 AM End time: 06/22/2024 7:45 AM Reason for block: surgical anesthesia Staffing Performed: resident/CRNA  Resident/CRNA: Trudy Rankin LABOR, CRNA Performed by: Trudy Rankin LABOR, CRNA Authorized by: Dario Barter, MD   Preanesthetic Checklist Completed: patient identified, IV checked, site marked, risks and benefits discussed, surgical consent, monitors and equipment checked, pre-op evaluation and timeout performed Spinal Block Patient position: sitting Prep: Betadine and ChloraPrep Patient monitoring: heart rate, continuous pulse ox and blood pressure Approach: midline Location: L3-4 Injection technique: single-shot Needle Needle type: Introducer and Pencan  Needle gauge: 24 G Needle length: 10 cm Assessment Sensory level: T4 Events: CSF return Additional Notes Negative paresthesia. Negative blood return. Positive free-flowing CSF. Expiration date of kit checked and confirmed. Patient tolerated procedure well, without complications.

## 2024-06-22 NOTE — Plan of Care (Signed)

## 2024-06-22 NOTE — Interval H&P Note (Signed)
 Patient history and physical updated. Consent reviewed including risks, benefits, and alternatives to surgery. Patient agrees with above plan to proceed with right anterior total hip arthroplasty.

## 2024-06-22 NOTE — Progress Notes (Signed)
 Patient is not able to walk the distance required to go the bathroom, or she is unable to safely negotiate stairs required to access the bathroom.  A 3in1 BSC will alleviate this problem.       Lollie Marrow, PA-C [facility]

## 2024-06-22 NOTE — Discharge Summary (Signed)
 Physician Discharge Summary  Patient ID: Kim Ruiz MRN: [id number] DOB/AGE: 1951/04/19 73 y.o.  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]   Discharge Diagnoses: Patient Active Problem List   Diagnosis Date Noted   S/P total right hip arthroplasty 06/22/2024   S/P total left hip arthroplasty 03/16/2024   AAA (abdominal aortic aneurysm) without rupture 02/09/2024   Essential hypertension 12/09/2023   Hyperlipidemia 12/09/2023   Pain in joint, lower leg 12/09/2023   Thoracic aortic aneurysm (TAA) 12/08/2023   Osteopenia 11/21/2023   Hypokalemia 11/21/2023   Superficial venous thrombosis of arm, right 08/22/2023   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/2023   Essential hypertension    Heart murmur    Mixed hyperlipidemia    Osteoarthritis of hips, bilateral    Osteopenia    Personal history of radiation therapy    Superficial venous thrombosis of arm, right    Thoracic ascending aortic aneurysm      Transfusion: none   Consultants (if any):   Discharged Condition: Improved  Hospital Course: Kim Ruiz is an 73 y.o. female who was admitted 06/22/2024 with a diagnosis of S/P total right hip arthroplasty and went to the operating room on 06/22/2024 and underwent the above named procedures.     Surgeries: Procedure(s): ARTHROPLASTY, HIP, TOTAL, ANTERIOR APPROACH on 06/22/2024 Patient tolerated the surgery well. Taken to PACU where she was stabilized and then transferred to the orthopedic floor.  Started on Eliquis , TEDs and SCDs applied bilaterally. Heels elevated on bed. No evidence of DVT. Negative Homan. Physical therapy started on day #1 for gait training and transfer. OT started day #1 for ADL and assisted devices.  Patient's IV was d/c on day #1. Patient was able to safely and independently complete all PT goals. PT recommending discharge to home.    On post op day #1 patient was stable and ready for discharge to home with HHPT.  Implants: : Cup: Trident Tritanium Clusterhole 54/E w/ x2 screws    Liner: Neutral X3 poly 36/E  Stem: Insignia #5 Std Offset  Head:Biolox Ceramic 36 +0    She was given perioperative antibiotics:  Anti-infectives (From admission, onward)    Start     Dose/Rate Route Frequency Ordered Stop   06/22/24 1330  ceFAZolin  (ANCEF ) IVPB 2g/100 mL premix        2 g 200 mL/hr over 30 Minutes Intravenous Every 6 hours 06/22/24 1157 06/22/24 2141   06/22/24 0615  ceFAZolin  (ANCEF ) IVPB 2g/100 mL premix        2 g 200 mL/hr over 30 Minutes Intravenous On call to O.R. 06/22/24 [id number] 06/22/24 0746     .  She was given sequential compression devices, early ambulation, and Eliquis  TEDs for DVT prophylaxis.  She benefited maximally from the hospital stay and there  were no complications.    Recent vital signs:  Vitals:   06/23/24 0441 06/23/24 0801  BP: (!) 126/59 (!) 141/67  Pulse: 67 62  Resp: 18 17  Temp: (!) 97.2 F (36.2 C) 98.3 F (36.8 C)  SpO2: 100% 98%    Recent laboratory studies:  Lab Results  Component Value Date   HGB 10.4 (L) 06/23/2024   HGB 11.6 (L) 05/21/2024   HGB 11.6 (L) 03/17/2024   Lab Results  Component Value Date   WBC 10.3 06/23/2024   PLT 190 06/23/2024   No results found for: INR Lab Results  Component  Value Date   NA 141 06/23/2024   K 3.7 06/23/2024   CL 109 06/23/2024   CO2 20 (L) 06/23/2024   BUN 17 06/23/2024   CREATININE 0.67 06/23/2024   GLUCOSE 139 (H) 06/23/2024    Discharge Medications:   Allergies as of 06/23/2024   No Known Allergies      Medication List     STOP taking these medications    ibuprofen 800 MG tablet Commonly known as: ADVIL       TAKE these medications    Acetaminophen  Extra Strength 500 MG Tabs Take 2 tablets (1,000 mg total) by mouth every 8 (eight) hours.   anastrozole  1 MG tablet Commonly known as: ARIMIDEX  Take 1 tablet (1 mg total) by mouth daily.   atorvastatin  20 MG tablet Commonly known as: LIPITOR Take 20 mg by mouth at bedtime.   CAL-MAG PO Take 1 capsule by mouth daily.   docusate sodium  100 MG capsule Commonly known as: COLACE Take 1 capsule (100 mg total) by mouth 2 (two) times daily.   Eliquis  2.5 MG Tabs tablet Generic drug: apixaban  Take 1 tablet (2.5 mg total) by mouth every 12 (twelve) hours for 28 days.   FT TURMERIC-BLACK PEPPER PO Take 1 capsule by mouth daily.   Geritol Liqd Take 15 mLs by mouth daily.   lidocaine  5 % Commonly known as: LIDODERM  Place 1 patch onto the skin daily as needed (pain).   losartan  50 MG tablet Commonly known as: COZAAR  Take 1 tablet (50 mg total) by mouth daily.   ondansetron  4 MG tablet Commonly known as: ZOFRAN  Take 1 tablet (4 mg total) by mouth every 6 (six) hours as needed for nausea.   traMADol  50 MG tablet Commonly known as: ULTRAM  Take 1 tablet (50 mg total) by mouth every 6 (six) hours as needed for moderate pain (pain score 4-6).               Durable Medical Equipment  (From admission, onward)           Start     Ordered   06/22/24 1332  For home use only DME Walker  Once       Question:  Patient needs a walker to treat with the following condition  Answer:  Status post total hip replacement, right   06/22/24 1332   06/22/24 1332  For  home use only DME 3 n 1  Once        06/22/24 1332            Diagnostic Studies: DG HIP UNILAT WITH PELVIS 2-3 VIEWS RIGHT Result Date: 06/22/2024 CLINICAL DATA:  Elective surgery. EXAM: DG HIP (WITH OR WITHOUT PELVIS) 2-3V RIGHT COMPARISON:  None Available. FINDINGS: Three fluoroscopic spot views of the pelvis and right hip obtained in the operating room. Images during hip arthroplasty. Fluoroscopy time 18 seconds.  Dose 2.631 mGy. Previous left hip arthroplasty. IMPRESSION: Intraoperative fluoroscopy during right hip arthroplasty. Electronically Signed   By: Andrea Gasman M.D.   On: 06/22/2024 11:18   DG C-Arm 1-60 Min-No Report Result Date: 06/22/2024 Fluoroscopy was utilized by the requesting physician.  No radiographic interpretation.   DG C-Arm 1-60 Min-No Report Result Date: 06/22/2024 Fluoroscopy was utilized by the requesting physician.  No radiographic interpretation.    Disposition:      Contact information for follow-up providers     Charlene Debby BROCKS, PA-C Follow up in 2 week(s).   Specialties: Orthopedic Surgery, Emergency Medicine Contact information: [address] [place] [address] [phone]              Contact information for after-discharge care     Home Medical Care     [facility] [place] [facility]) .   Service: Home Health Services Contact information: [address] [place]  [address] [phone]                      Signed: Debby BROCKS Charlene 06/23/2024, 8:10 AM

## 2024-06-22 NOTE — Transfer of Care (Signed)
 Immediate Anesthesia Transfer of Care Note  Patient: Kim Ruiz  Procedure(s) Performed: ARTHROPLASTY, HIP, TOTAL, ANTERIOR APPROACH (Right: Hip)  Patient Location: PACU  Anesthesia Type:Spinal  Level of Consciousness: awake and drowsy  Airway & Oxygen Therapy: Patient Spontanous Breathing and Patient connected to face mask  Post-op Assessment: Report given to RN and Post -op Vital signs reviewed and stable  Post vital signs: Reviewed and stable  Last Vitals:  Vitals Value Taken Time  BP 107/68 06/22/24 09:56  Temp    Pulse 66 06/22/24 09:57  Resp 14 06/22/24 09:57  SpO2 100 % 06/22/24 09:57  Vitals shown include unfiled device data.  Last Pain:  Vitals:   06/22/24 0619  TempSrc: Temporal  PainSc: 0-No pain         Complications: No notable events documented.

## 2024-06-22 NOTE — Progress Notes (Signed)
 Patient's discharge medications received from [facility] community pharmacy in a sealed bag. RN locked medications up. Medications will be given to patient tomorrow at discharge.

## 2024-06-22 NOTE — Evaluation (Addendum)
 Physical Therapy Evaluation Patient Details Name: Kim Ruiz MRN: [id number] DOB: 1951/06/18 Today's Date: 06/22/2024  History of Present Illness  Kim Ruiz is an 73 y.o. female presents for evaluation of her right hip. Patient reports that after having her left hip replaced a few months ago and no longer having any pain in the left side her right hip has become significantly more painful and functionally limiting her. Pt today PO Right Total Hip Arthroplasty.  Clinical Impression  Patient seen for initial PT evaluation due to decline in functional strength. Patient is A&O x 4. Baseline mobility reported as independent, currently requiring CGA/minA for transfers, gait and stair training. Gait assessed 250 feet with RW CGA for safety, limited by impaired safety awareness. Pt lives with children with help PRN/intermittently. Pt has & steps to enter with L hand rail. Clinical impression: patient presents with moderate mobility limitations secondary to PO limitations and precuations. Recommend skilled PT to address safety, mobility, and discharge planning. PT recommend d/c to HHPT         If plan is discharge home, recommend the following: A little help with walking and/or transfers;A little help with bathing/dressing/bathroom   Can travel by private vehicle        Equipment Recommendations    Recommendations for Other Services       Functional Status Assessment Patient has had a recent decline in their functional status and/or demonstrates limited ability to make significant improvements in function in a reasonable and predictable amount of time     Precautions / Restrictions Precautions Precautions: Anterior Hip Precaution Booklet Issued: Yes (comment) Recall of Precautions/Restrictions: Intact Restrictions Weight Bearing Restrictions Per Provider Order: Yes RLE Weight Bearing Per Provider Order: Weight bearing as tolerated      Mobility  Bed Mobility Overal bed  mobility: Needs Assistance Bed Mobility: Rolling, Sidelying to Sit, Supine to Sit, Sit to Supine, Sit to Sidelying Rolling: Contact guard assist Sidelying to sit: Contact guard assist Supine to sit: Contact guard Sit to supine: Contact guard assist Sit to sidelying: Contact guard assist General bed mobility comments: vc for hand use and vc to decreased movement speed    Transfers Overall transfer level: Needs assistance Equipment used: Rolling walker (2 wheels) Transfers: Sit to/from Stand, Bed to chair/wheelchair/BSC Sit to Stand: Contact guard assist           General transfer comment: vc for movement speed and movement awareness    Ambulation/Gait Ambulation/Gait assistance: Min assist, Contact guard assist Gait Distance (Feet): 250 Feet Assistive device: Rolling walker (2 wheels) Gait Pattern/deviations: Step-to pattern, Step-through pattern, Narrow base of support, Trunk flexed, Decreased stance time - right, Decreased stride length       General Gait Details: vc for upright posture  Stairs Stairs: Yes Stairs assistance: Min assist Stair Management: One rail Right, One rail Left, Two rails, Step to pattern Number of Stairs: 8 General stair comments: vc for safety awareness; occassional attempts to use R side  Wheelchair Mobility     Tilt Bed    Modified Rankin (Stroke Patients Only)       Balance Overall balance assessment: Needs assistance Sitting-balance support: Feet supported, Single extremity supported Sitting balance-Leahy Scale: Good     Standing balance support: During functional activity, Reliant on assistive device for balance Standing balance-Leahy Scale: Good                               Pertinent  Vitals/Pain Pain Assessment Pain Assessment: No/denies pain    Home Living Family/patient expects to be discharged to:: Private residence Living Arrangements: Children;Other relatives Available Help at Discharge:  Friend(s);Available PRN/intermittently Type of Home: House Home Access: Level entry     Alternate Level Stairs-Number of Steps: 7 steps with L rail to 2nd level, pt stated that its not necessry to go to 3rd level at this time Home Layout: Multi-level Home Equipment: Rollator (4 wheels);Cane - single point Additional Comments: Pt primarily utilizes bilateral SPCs    Prior Function Prior Level of Function : Independent/Modified Independent             Mobility Comments: Pt reported being independent with community distances with bilat SPCs. Pt reported 0 falls in the last year.       Extremity/Trunk Assessment        Lower Extremity Assessment Lower Extremity Assessment: Generalized weakness    Cervical / Trunk Assessment Cervical / Trunk Assessment: Normal  Communication   Communication Communication: No apparent difficulties    Cognition Arousal: Alert Behavior During Therapy: WFL for tasks assessed/performed   PT - Cognitive impairments: No apparent impairments                         Following commands: Intact       Cueing Cueing Techniques: Verbal cues     General Comments      Exercises Total Joint Exercises Ankle Circles/Pumps: AROM, Left, Right, 10 reps Quad Sets: AROM, Right, Left, 10 reps Gluteal Sets: AROM, Right, Left, 10 reps Towel Squeeze: AROM, Right, Left, 10 reps Short Arc Quad: 10 reps Heel Slides: AROM, Right, Left, 10 reps Hip ABduction/ADduction: AROM, Right, Left, 10 reps Straight Leg Raises: AROM, Right, Left, 10 reps Long Arc Quad: AROM, Right, Left, 10 reps Other Exercises Other Exercises: Pt educated on the role of PT, hip precautions and exercise booklet   Assessment/Plan    PT Assessment Patient needs continued PT services  PT Problem List Decreased strength;Decreased balance;Decreased mobility;Decreased activity tolerance       PT Treatment Interventions Gait training;Stair training;Functional mobility  training;Therapeutic activities;Therapeutic exercise;Balance training;Neuromuscular re-education    PT Goals (Current goals can be found in the Care Plan section)  Acute Rehab PT Goals Patient Stated Goal: Pt wants to go home PT Goal Formulation: With patient Time For Goal Achievement: 07/20/24 Potential to Achieve Goals: Good    Frequency BID     Co-evaluation               AM-PAC PT 6 Clicks Mobility  Outcome Measure Help needed turning from your back to your side while in a flat bed without using bedrails?: A Little Help needed moving from lying on your back to sitting on the side of a flat bed without using bedrails?: A Little Help needed moving to and from a bed to a chair (including a wheelchair)?: A Little Help needed standing up from a chair using your arms (e.g., wheelchair or bedside chair)?: A Little Help needed to walk in hospital room?: A Little Help needed climbing 3-5 steps with a railing? : A Little 6 Click Score: 18    End of Session Equipment Utilized During Treatment: Gait belt Activity Tolerance: Patient tolerated treatment well Patient left: in bed;with bed alarm set Nurse Communication: Mobility status;Patient requests pain meds PT Visit Diagnosis: Unsteadiness on feet (R26.81);Muscle weakness (generalized) (M62.81);Difficulty in walking, not elsewhere classified (R26.2);Pain Pain - Right/Left: Right Pain - part  of body: Hip    Time: 0141-0205 PT Time Calculation (min) (ACUTE ONLY): 24 min   Charges:   PT Evaluation $PT Eval Low Complexity: 1 Low   PT General Charges $$ ACUTE PT VISIT: 1 Visit         Sherlean Lesches DPT, PT    Ala Capri A Agam Tuohy 06/22/2024, 2:17 PM

## 2024-06-22 NOTE — H&P (Signed)
 History of Present Illness: Kim Ruiz is an 73 y.o. female presents for evaluation of her right hip. Patient reports that after having her left hip replaced a few months ago and no longer having any pain in the left side her right hip has become significantly more painful and functionally limiting her. She reports that pain up to an 8 out of 10 today is limiting her ability to walk due to her right hip requiring use of a cane. He is not participating in activities due to her right hip at this point and wants to move forward with surgical intervention on her right side. The patient denies fevers, chills, numbness, tingling, shortness of breath, chest pain, recent illness, or any trauma.  Patient has a BMI of 27.5 is a non-smoker and currently on Eliquis   Past Medical History: Past Medical History:  Diagnosis Date  Heart murmur  Hyperlipidemia  Hypertension   Past Surgical History: Past Surgical History:  Procedure Laterality Date  MASTECTOMY PARTIAL Right 05/31/2023  Dr. Lucas Catchings --- w/ RF & SN  knee surgery 2014 Dr. Claudene Right   Past Family History: Family History  Problem Relation Age of Onset  Myocardial Infarction (Heart attack) Mother   Medications: Current Outpatient Medications  Medication Sig Dispense Refill  alendronate (FOSAMAX) 70 MG tablet Take 70 mg by mouth every 7 (seven) days  amLODIPine  (NORVASC ) 5 MG tablet TAKE 1 TABLET(5 MG) BY MOUTH EVERY DAY 30 tablet 11  anastrozole  (ARIMIDEX ) 1 mg tablet Take 1 mg by mouth once daily  apixaban  (ELIQUIS ) 2.5 mg tablet Take 2.5 mg by mouth 2 (two) times daily (Patient not taking: Reported on 05/07/2024)  atorvastatin  (LIPITOR) 20 MG tablet Take 1 tablet (20 mg total) by mouth once daily 90 tablet 3  calcium  carbonate-vitamin D3 (CALTRATE 600+D) 600 mg-10 mcg (400 unit) tablet Take 1 tablet by mouth as needed  cyclobenzaprine  (FLEXERIL ) 10 MG tablet Take 10 mg by mouth at bedtime as needed  gabapentin  (NEURONTIN )  600 MG tablet Take 600mg  once daily as needed for head/neck pain. Do not take with other sedating medications. 30 tablet 4  ibuprofen (MOTRIN) 800 MG tablet Take 800 mg by mouth every 6 (six) hours as needed  lidocaine  (LIDODERM ) 5 % patch  losartan  (COZAAR ) 50 MG tablet Take 50 mg by mouth once daily  multivit-min36/iron/folic acid (GERITOL COMPLETE ORAL) Take 1 tablet by mouth once daily  multivitamin tablet Take 1 tablet by mouth once daily  potassium chloride (K-TAB) 20 mEq TbER ER tablet Take 1 tablet by mouth once daily (Patient not taking: Reported on 05/07/2024)  traMADoL  (ULTRAM ) 50 mg tablet Take 1 tablet (50 mg total) by mouth every 6 (six) hours 20 tablet 0  turmeric, bulk, 100 % Powd Take 2 capsules by mouth once daily   No current facility-administered medications for this visit.   Allergies: No Known Allergies   Visit Vitals: Vitals:  05/29/24 1139  BP: 130/80    Review of Systems:  A comprehensive 14 point ROS was performed, reviewed, and the pertinent orthopaedic findings are documented in the HPI.  Physical Exam: Body mass index is 27.57 kg/m. General/Constitutional: No apparent distress: well-nourished and well developed. Lymphatic: No palpable adenopathy. Pulmonary exam: Lungs clear to auscultation bilaterally no wheezing rales or rhonchi Cardiac exam: Regular rate and rhythm no obvious murmurs rubs or gallops. Vascular: No edema, swelling or tenderness, except as noted in detailed exam. Integumentary: No impressive skin lesions present, except as noted in detailed exam. Neuro/Psych: Normal  mood and affect, oriented to person, place and time. Musculoskeletal: Normal, except as noted in detailed exam and in HPI.  Right hip exam  SKIN: intact SWELLING: none WARMTH: no warmth TENDERNESS: none, Stinchfield Positive ROM: 0 degrees internal rotation and 10 degrees external rotation and pain with internal rotation,; Hip Flexion 80 STRENGTH: limited by pain GAIT:  stiff-legged STABILITY: stable to testing CREPITUS: yes LEG LENGTH DISCREPANCY: none NEUROLOGICAL EXAM: normal VASCULAR EXAM: normal LUMBAR SPINE: tenderness: no straight leg raising sign: no motor exam: normal  The contralateral hip was examined for comparison and it showed: TENDERNESS: none ROM: normal and full STRENGTH: normal STABILITY: stable to testing  Hip Imaging :  I have reviewed AP pelvis and lateral hip X-rays (2 views) taken today in the office of the right hip which reveal severe degenerative changes with femoral head deformity and flattening cystic changes with bone-on-bone articulation. The left hip is status post total hip arthroplasty with components in unchanged position from prior films no evidence of periprosthetic fracture or loosening.   Assessment:  Encounter Diagnosis  Name Primary?  Right hip pain Yes   Right hip osteoarthritis  Plan: [name] is a 73 year old female who presents with severe right hip bone on bone arthritis. Based upon the patient's continued symptoms and failure to respond to conservative treatment, I have recommended a right total hip replacement for this patient. A long discussion took place with the patient describing what a total joint replacement is and what the procedure would entail. A hip model, similar to the implants that will be used during the operation, was utilized to demonstrate the implants. Choices of implant manufactures were discussed and reviewed. The ability to secure the implant utilizing cement or cementless (press fit) fixation was discussed. Anterior and posterior exposures were discussed. For this patient an appropriate approach will be anterior.   The hospitalization and post-operative care and rehabilitation were also discussed. The use of perioperative antibiotics and DVT prophylaxis were discussed. The risk, benefits and alternatives to a surgical intervention were discussed at length with the patient. The patient  was also advised of risks related to the medical comorbidities and elevated body mass index (BMI). A lengthy discussion took place to review the most common complications including but not limited to: deep vein thrombosis, pulmonary embolus, heart attack, stroke, infection, wound breakdown, heterotopic ossification, dislocation, numbness, leg length in-equality, intraoperative fracture, damage to nerves, tendon,muscles, arteries or other blood vessels, death and other possible complications from anesthesia. The patient was told that we will take steps to minimize these risks by using sterile technique, antibiotics and DVT prophylaxis when appropriate and follow the patient postoperatively in the office setting to monitor progress. The possibility of recurrent pain, no improvement in pain and actual worsening of pain were also discussed with the patient. The risk of dislocation following total hip replacement was discussed and potential precautions to prevent dislocation were reviewed.   Patient asked about and confirms no history of any reactions to metal or metal allergy in the past.  The discharge plan of care focused on the patient going home following surgery. The patient was encouraged to make the necessary arrangements to have someone stay with them when they are discharged home.   The benefits of surgery were discussed with the patient including the potential for improving the patient's current clinical condition through operative intervention. Alternatives to surgical intervention including continued conservative management were also discussed in detail. All questions were answered to the satisfaction of the patient. The  patient participated and agreed to the plan of care as well as the use of the recommended implants for their total hip replacement surgery. An information packet was given to the patient to review prior to surgery.   The patient received clearance for surgery and is now more than 3  months from her other hip.  All questions answered and patient agrees to the above plan for a right anterior total replacement.   Portions of this record have been created using Scientist, clinical (histocompatibility and immunogenetics). Dictation errors have been sought, but may not have been identified and corrected.

## 2024-06-22 NOTE — Anesthesia Preprocedure Evaluation (Signed)
 Anesthesia Evaluation  Patient identified by MRN, date of birth, ID band Patient awake    Reviewed: Allergy & Precautions, H&P , NPO status , Patient's Chart, lab work & pertinent test results, reviewed documented beta blocker date and time   History of Anesthesia Complications Negative for: history of anesthetic complications  Airway Mallampati: II  TM Distance: >3 FB Neck ROM: full    Dental  (+) Dental Advidsory Given, Edentulous Upper, Edentulous Lower, Upper Dentures, Lower Dentures   Pulmonary neg pulmonary ROS, former smoker   Pulmonary exam normal breath sounds clear to auscultation       Cardiovascular Exercise Tolerance: Good hypertension, (-) angina (-) Past MI and (-) Cardiac Stents Normal cardiovascular exam(-) dysrhythmias + Valvular Problems/Murmurs  Rhythm:regular Rate:Normal     Neuro/Psych negative neurological ROS  negative psych ROS   GI/Hepatic negative GI ROS, Neg liver ROS,,,  Endo/Other  negative endocrine ROS    Renal/GU negative Renal ROS  negative genitourinary   Musculoskeletal   Abdominal   Peds  Hematology negative hematology ROS (+)   Anesthesia Other Findings Past Medical History: No date: Arthritis 02/2024: Complication of anesthesia     Comment:  vocal cord injury?? 05/2023: Ductal carcinoma in situ (DCIS) of right breast No date: Essential hypertension No date: Heart murmur No date: Mixed hyperlipidemia No date: Osteoarthritis of hips, bilateral No date: Osteopenia No date: Personal history of radiation therapy No date: Superficial venous thrombosis of arm, right No date: Thoracic ascending aortic aneurysm   Reproductive/Obstetrics negative OB ROS                              Anesthesia Physical Anesthesia Plan  ASA: 2  Anesthesia Plan: Spinal   Post-op Pain Management:    Induction:   PONV Risk Score and Plan: 3 and Propofol  infusion  and TIVA  Airway Management Planned: Natural Airway and Simple Face Mask  Additional Equipment:   Intra-op Plan:   Post-operative Plan:   Informed Consent: I have reviewed the patients History and Physical, chart, labs and discussed the procedure including the risks, benefits and alternatives for the proposed anesthesia with the patient or authorized representative who has indicated his/her understanding and acceptance.     Dental Advisory Given  Plan Discussed with: Anesthesiologist, CRNA and Surgeon  Anesthesia Plan Comments:         Anesthesia Quick Evaluation

## 2024-06-22 NOTE — Op Note (Signed)
 Patient Name: Kim Ruiz  FMW:[id number]  Pre-Operative Diagnosis: Right hip Osteoarthritis  Post-Operative Diagnosis: (same)  Procedure: Right Total Hip Arthroplasty  Components/Implants: Cup: Trident Tritanium Clusterhole 54/E w/ x2 screws    Liner: Neutral X3 poly 36/E  Stem: Insignia #5 Std Offset  Head:Biolox Ceramic 36 +0  Date of Surgery: 06/22/2024  Surgeon: Arthea Sheer MD  Assistant: Debby Amber PA (present and scrubbed throughout the case, critical for assistance with exposure, retraction, instrumentation, and closure)   Anesthesiologist: Dario  Anesthesia: Spinal   EBL: 150cc  IVF:600cc  Complications: None   Brief history: The patient is a 73 year old female with a history of osteoarthritis of the right hip with pain limiting their range of motion and activities of daily living, which has failed multiple attempts at conservative therapy.  The risks and benefits of total hip arthroplasty as definitive surgical treatment were discussed with the patient, who opted to proceed with the operation.  After outpatient medical clearance and optimization was completed the patient was admitted to [facility] for the procedure.  All preoperative films were reviewed and an appropriate surgical plan was made prior to surgery.   Description of procedure: The patient was brought to the operating room where laterality was confirmed by all those present to be the right side.  The patient was administered spinal anesthesia on a stretcher prior to being moved supine on the operating room table. Patient was given an intravenous dose of antibiotics for surgical prophylaxis and TXA.  All bony prominences and extremities were well padded and the patient was securely attached to the table boots, a perineal post was placed and the patient had a safety strap placed.  Surgical site was prepped with alcohol and chlorhexidine . The surgical site over the hip was and  draped in typical sterile fashion with multiple layers of adhesive and nonadhesive drapes.  The incision site was marked out with a sterile marker and care was taken to assess the position of the ASIS and ensure appropriate position for the incision.    A surgical timeout was then called with participation of all staff in the room the patient was then a confirmed again and laterality confirmed.  Incision was made over the anterior lateral aspect of the proximal thigh in line with the TFL.  Appropriate retractors were placed and all bleeding vessels were coagulated within the subcutaneous and fatty layers.  An incision was made in the TFL fascia in the interval was carefully identified.  The lateral ascending branches of the circumflex vessels were identified, cauterized and carefully dissected. The main vessels were then tied with a 0 silk hand tie.  Retractors were placed around the superior lateral and inferior medial aspects of the femoral neck and a capsulotomy was performed exposing the hip joint.  Retraction stitches were placed and the capsulotomy to assist with visualization.  Femoral neck cut was then made and the femoral head was extracted after placing the leg in traction.  Bone wax was then applied to the proximal cut surface of the femur and water  cooled bipolar electrocautery was used to address any bleeding around the femoral neck cut.  Retractors were then placed around the acetabulum to fully visualize the joint space, and the remaining labral tissue was removed and pulvinar was removed.   The acetabulum was then sequentially reamed up to the appropriate size in order to get good fit and fill for the acetabular component while under fluoroscopic guidance.  Acetabular component was then placed  and malleted into a secure fit while confirming position and abduction angle and anteversion utilizing fluoroscopy.  2 screws were then placed in the acetabular cup to assist in securing the cup in place.  The cup was irrigated,  a real neutral liner was placed, impacted, and checked for stability. The femur traction was dropped and sequentially externally rotated while performing a release of the posterior and superomedial tissues off of the proximal femur to allow for mobility, care was taken to preserve the external rotators and piriformis attachments.  The remaining interval between the abductors and the capsule was dissected out and a retractor was placed over the superolateral aspect of the femur over the greater trochanter.  The leg was carefully brought down into extension and adducted to provide visualization of the proximal femur for broaching.  The femur was then sequentially broached up to an appropriate size which provided for good fill and stability to the femoral broach.  A trial neck and head were placed on the femoral broach and the leg was brought up for reduction.  The hip was reduced and manual check of stability was performed.  The hip was found to be stable in flexion internal rotation and extension external rotation.  Leg lengths were confirmed on fluoroscopy.   The hip was then dislocated the trial neck and head were removed.  The leg was then brought down into extension and adduction in the proximal femur was reexposed.  The broach trial was removed and the femur was irrigated with normal saline prior to the real femoral stem being implanted.  After the femoral stem was seated and shown to have good fit and fill the appropriate head was impacted the leg was brought up and reduced.  There was good range of motion with stability in flexion internal rotation and extension external rotation on testing.  Leg lengths were found to be appropriate on fluoroscopic evaluation at this time.  The hip was then irrigated with betdine based surgiphor solution and then saline solution.  The capsulotomy was repaired with Ethibond sutures.  A pericapsular and peritrochanteric cocktail with Exparel  and  bupivacaine  was then injected as well as the subcutaneous tissues. The fascia was closed with a #1 barbed running suture.  The deep tissues were closed with Vicryl sutures the subcutaneous tissues were closed with interrupted Vicryl sutures and a running barbed 4-0 suture.  The skin was then reinforced with Dermabond and a sterile dressing was placed.   The patient was awoken from anesthesia transferred off of the operating room table onto a hospital bed where examination of leg lengths found the leg lengths to be equal with a good distal pulse.  The patient was then transferred to the PACU in stable condition.

## 2024-06-22 NOTE — TOC Progression Note (Signed)
 Transition of Care [facility]) - Progression Note    Patient Details  Name: Kim Ruiz MRN: [id number] Date of Birth: [birth year]  Transition of [facility]) CM/SW Contact  Marinda Cooks, RN Phone Number: 06/22/2024, 4:39 PM  Clinical Narrative:    This CM called and spoke with pt Introduced role and confirmed pt was open to [facility] services per PT recommendations. Pt provided choice and confirmed she did not have a preference for [facility] agency. This CM sent referral to [facility] and confirmed [facility] order / referral can be accepted . TOC will cont to follow dc planning / care coordination and update as applicable.    Expected Discharge Plan and Services    Home with [facility]   Social Drivers of Health (SDOH) Interventions SDOH Screenings   Food Insecurity: No Food Insecurity (06/22/2024)  Housing: Low Risk  (06/22/2024)  Transportation Needs: No Transportation Needs (06/22/2024)  Utilities: Not At Risk (06/22/2024)  Depression (PHQ2-9): Low Risk  (05/24/2023)  Financial Resource Strain: Low Risk  (05/29/2024)   Received from [facility]  Social Connections: Moderately Integrated (06/22/2024)  Tobacco Use: Medium Risk (06/22/2024)    Readmission Risk Interventions     No data to display

## 2024-06-22 NOTE — Discharge Instructions (Signed)
 Instructions after Anterior Total Hip Replacement        Dr. Zachary Aberman, Jr., M.D.      Dept. of Orthopaedics & Sports Medicine  [facility]  [address]  [place], [place]  [address]  Phone: [phone]   Fax: [phone]    DIET: Drink plenty of non-alcoholic fluids. Resume your normal diet. Include foods high in fiber.  ACTIVITY:  You may use crutches or a walker with weight-bearing as tolerated, unless instructed otherwise. You may be weaned off of the walker or crutches by your Physical Therapist.  Continue doing gentle exercises. Exercising will reduce the pain and swelling, increase motion, and prevent muscle weakness.   Please continue to use the TED compression stockings for 2 weeks. You may remove the stockings at night, but should reapply them in the morning. Do not drive or operate any equipment until instructed.  WOUND CARE:  Continue to use ice packs periodically to reduce pain and swelling. You may shower with honeycomb dressing 3 days after your surgery. Do not submerge incision site under water. Remove honeycomb dressing 7 days after surgery and allow dermabond to fall off on its own.   MEDICATIONS: You may resume your regular medications. Please take the pain medication as prescribed on the medication list. Do not take pain medication on an empty stomach. Pain medications and iron supplements can cause constipation. Use a stool softener (Senokot or Colace) on a daily basis and a laxative (dulcolax or miralax ) as needed. Do not drive or drink alcoholic beverages when taking pain medications.  POSTOPERATIVE CONSTIPATION PROTOCOL Constipation - defined medically as fewer than three stools per week and severe constipation as less than one stool per week.  One of the most common issues patients have following surgery is constipation.  Even if you have a regular bowel pattern at home, your normal regimen is likely to be disrupted due to multiple  reasons following surgery.  Combination of anesthesia, postoperative narcotics, change in appetite and fluid intake all can affect your bowels.  In order to avoid complications following surgery, here are some recommendations in order to help you during your recovery period.  Colace (docusate) - Pick up an over-the-counter form of Colace or another stool softener and take twice a day as long as you are requiring postoperative pain medications.  Take with a full glass of water daily.  If you experience loose stools or diarrhea, hold the colace until you stool forms back up.  If your symptoms do not get better within 1 week or if they get worse, check with your doctor.  Dulcolax (bisacodyl ) - Pick up over-the-counter and take as directed by the product packaging as needed to assist with the movement of your bowels.  Take with a full glass of water.  Use this product as needed if not relieved by Colace only.   MiraLax  (polyethylene glycol) - Pick up over-the-counter to have on hand.  MiraLax  is a solution that will increase the amount of water in your bowels to assist with bowel movements.  Take as directed and can mix with a glass of water, juice, soda, coffee, or tea.  Take if you go more than two days without a movement. Do not use MiraLax  more than once per day. Call your doctor if you are still constipated or irregular after using this medication for 7 days in a row.  If you continue to have problems with postoperative constipation, please contact the office for further assistance and recommendations.  If you experience the worst abdominal pain ever or develop nausea or vomiting, please contact the office immediatly for further recommendations for treatment.   CALL THE OFFICE FOR: Temperature above 101 degrees Excessive bleeding or drainage on the dressing. Excessive swelling, coldness, or paleness of the toes. Persistent nausea and vomiting.  FOLLOW-UP:  You should have an appointment to  return to the office in 2 weeks after surgery. Arrangements have been made for continuation of Physical Therapy (either home therapy or outpatient therapy).

## 2024-06-23 ENCOUNTER — Encounter: Payer: Self-pay | Admitting: Orthopedic Surgery

## 2024-06-23 DIAGNOSIS — M160 Bilateral primary osteoarthritis of hip: Secondary | ICD-10-CM | POA: Diagnosis not present

## 2024-06-23 LAB — CBC
HCT: 32.3 % — ABNORMAL LOW (ref 36.0–46.0)
Hemoglobin: 10.4 g/dL — ABNORMAL LOW (ref 12.0–15.0)
MCH: 25.2 pg — ABNORMAL LOW (ref 26.0–34.0)
MCHC: 32.2 g/dL (ref 30.0–36.0)
MCV: 78.2 fL — ABNORMAL LOW (ref 80.0–100.0)
Platelets: 190 K/uL (ref 150–400)
RBC: 4.13 MIL/uL (ref 3.87–5.11)
RDW: 14.2 % (ref 11.5–15.5)
WBC: 10.3 K/uL (ref 4.0–10.5)
nRBC: 0 % (ref 0.0–0.2)

## 2024-06-23 LAB — BASIC METABOLIC PANEL WITH GFR
Anion gap: 12 (ref 5–15)
BUN: 17 mg/dL (ref 8–23)
CO2: 20 mmol/L — ABNORMAL LOW (ref 22–32)
Calcium: 10.1 mg/dL (ref 8.9–10.3)
Chloride: 109 mmol/L (ref 98–111)
Creatinine, Ser: 0.67 mg/dL (ref 0.44–1.00)
GFR, Estimated: >60 mL/min (ref >60)
Glucose, Bld: 139 mg/dL — ABNORMAL HIGH (ref 70–99)
Potassium: 3.7 mmol/L (ref 3.5–5.1)
Sodium: 141 mmol/L (ref 135–145)

## 2024-06-23 NOTE — Progress Notes (Signed)
 DISCHARGE NOTE:  Pt given discharge instructions, and verbalized understanding. Meds to beds medications sent with pt. TED hose on both . Pt wheeled to car by staff, friend providing transportation.

## 2024-06-23 NOTE — Progress Notes (Signed)
   Subjective: 1 Day Post-Op Procedure(s) (LRB): ARTHROPLASTY, HIP, TOTAL, ANTERIOR APPROACH (Right) Patient reports pain as mild.   Patient is well, and has had no acute complaints or problems Denies any CP, SOB, ABD pain. We will continue therapy today.  Plan is to go Home after hospital stay.  Objective: Vital signs in last 24 hours: Temp:  [97 F (36.1 C)-98.3 F (36.8 C)] 98.3 F (36.8 C) (10/21 0801) Pulse Rate:  [46-77] 62 (10/21 0801) Resp:  [12-20] 17 (10/21 0801) BP: (106-165)/(59-87) 141/67 (10/21 0801) SpO2:  [97 %-100 %] 98 % (10/21 0801)  Intake/Output from previous day: 10/20 0701 - 10/21 0700 In: 1308.2 [P.O.:360; I.V.:850.1; IV Piggyback:98.1] Out: 550 [Urine:500; Blood:50] Intake/Output this shift: No intake/output data recorded.  Recent Labs    06/23/24 0649  HGB 10.4*   Recent Labs    06/23/24 0649  WBC 10.3  RBC 4.13  HCT 32.3*  PLT 190   Recent Labs    06/23/24 0649  NA 141  K 3.7  CL 109  CO2 20*  BUN 17  CREATININE 0.67  GLUCOSE 139*  CALCIUM  10.1   No results for input(s): LABPT, INR in the last 72 hours.  EXAM General - Patient is Alert, Appropriate, and Oriented Extremity - Neurovascular intact Sensation intact distally Intact pulses distally Dorsiflexion/Plantar flexion intact Dressing - dressing C/D/I and no drainage Motor Function - intact, moving foot and toes well on exam.   Past Medical History:  Diagnosis Date   Arthritis    Complication of anesthesia 02/2024   vocal cord injury??   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   Weight as of 06/15/24: 77.6 kg. Advance diet Up with therapy Labs and vital signs are stable Pain well-controlled Care management to assist with discharge to home with home health PT today pending safe completion of PT goals.  DVT Prophylaxis - TED hose and SCDs, Eliquis  Weight-Bearing as tolerated to right leg   T. Medford Amber, PA-C [facility] 06/23/2024, 8:08 AM

## 2024-06-23 NOTE — Anesthesia Postprocedure Evaluation (Signed)
 Anesthesia Post Note  Patient: Kim Ruiz  Procedure(s) Performed: ARTHROPLASTY, HIP, TOTAL, ANTERIOR APPROACH (Right: Hip)  Patient location during evaluation: Short Stay Anesthesia Type: Spinal Level of consciousness: awake and alert and oriented Pain management: satisfactory to patient Vital Signs Assessment: post-procedure vital signs reviewed and stable Respiratory status: spontaneous breathing Cardiovascular status: stable Postop Assessment: patient able to bend at knees, no apparent nausea or vomiting, adequate PO intake and able to ambulate Anesthetic complications: no Comments: Has been able to void.   No notable events documented.   Last Vitals:  Vitals:   06/23/24 0024 06/23/24 0441  BP: 124/71 (!) 126/59  Pulse: 77 67  Resp: 18 18  Temp: 36.6 C (!) 36.2 C  SpO2: 97% 100%    Last Pain:  Vitals:   06/23/24 0620  TempSrc:   PainSc: 2                  [name] [name]

## 2024-06-23 NOTE — Plan of Care (Signed)
   Problem: Activity: Goal: Ability to avoid complications of mobility impairment will improve Outcome: Progressing   Problem: Pain Management: Goal: Pain level will decrease with appropriate interventions Outcome: Progressing

## 2024-06-23 NOTE — Progress Notes (Signed)
 Physical Therapy Treatment Patient Details Name: Kim Ruiz MRN: [id number] DOB: 1951/02/24 Today's Date: 06/23/2024   History of Present Illness Kim Ruiz is an 73 y.o. female presents for evaluation of her right hip. Patient reports that after having her left hip replaced a few months ago and no longer having any pain in the left side her right hip has become significantly more painful and functionally limiting her. Pt today PO Right Total Hip Arthroplasty.    PT Comments  Pt was seated in recliner upon arrival. Agreeable to session and motivated throughout. I want to go home. Safely demonstrated abilities to stand and ambulate with RW without physical assistance. She will benefit from continued skilled PT at DC to maximize her independence and safety with all ADLs. Dc recs remain appropriate, pt endorses having all DME needs met.    If plan is discharge home, recommend the following: A little help with walking and/or transfers;A little help with bathing/dressing/bathroom     Equipment Recommendations  None recommended by PT (pt refused anyt DME needs)       Precautions / Restrictions Precautions Precautions: Anterior Hip Precaution Booklet Issued: Yes (comment) Recall of Precautions/Restrictions: Intact Restrictions Weight Bearing Restrictions Per Provider Order: Yes RLE Weight Bearing Per Provider Order: Weight bearing as tolerated     Mobility  Bed Mobility Overal bed mobility: Needs Assistance Bed Mobility: Supine to Sit, Sit to Supine Rolling: Supervision Supine to sit: Supervision Sit to supine: Supervision   Transfers Overall transfer level: Needs assistance Equipment used: Rolling walker (2 wheels) Transfers: Sit to/from Stand Sit to Stand: Supervision   Ambulation/Gait Ambulation/Gait assistance: Supervision Gait Distance (Feet): 200 Feet Assistive device: Rolling walker (2 wheels) Gait Pattern/deviations: Step-through pattern Gait velocity:  decreased  General Gait Details: Pt ambulated ~ 200 ft with RW with no LOB or safety concerns   Stairs  General stair comments: Pt was able to perform stairs previous date and was unwilling to perform this session    Balance Overall balance assessment: Needs assistance Sitting-balance support: Feet supported, Single extremity supported Sitting balance-Leahy Scale: Good     Standing balance support: During functional activity, Reliant on assistive device for balance Standing balance-Leahy Scale: Good     Communication Communication Communication: No apparent difficulties  Cognition Arousal: Alert Behavior During Therapy: WFL for tasks assessed/performed   PT - Cognitive impairments: No apparent impairments    PT - Cognition Comments: Pt is A and O Following commands: Intact      Cueing Cueing Techniques: Verbal cues, Tactile cues     General Comments General comments (skin integrity, edema, etc.): Reviewed what to do at home at DC and expectation after DC from acute hospital      Pertinent Vitals/Pain Pain Assessment Pain Assessment: No/denies pain Pain Descriptors / Indicators: Sore Pain Intervention(s): Limited activity within patient's tolerance, Monitored during session, Premedicated before session, Repositioned     PT Goals (current goals can now be found in the care plan section) Acute Rehab PT Goals Patient Stated Goal: go home today Progress towards PT goals: Progressing toward goals    Frequency    BID       AM-PAC PT 6 Clicks Mobility   Outcome Measure  Help needed turning from your back to your side while in a flat bed without using bedrails?: A Little Help needed moving from lying on your back to sitting on the side of a flat bed without using bedrails?: A Little Help needed moving to and from a bed  to a chair (including a wheelchair)?: A Little Help needed standing up from a chair using your arms (e.g., wheelchair or bedside chair)?: A  Little Help needed to walk in hospital room?: A Little Help needed climbing 3-5 steps with a railing? : A Little 6 Click Score: 18    End of Session   Activity Tolerance: Patient tolerated treatment well Patient left: in bed;with bed alarm set Nurse Communication: Mobility status;Patient requests pain meds PT Visit Diagnosis: Unsteadiness on feet (R26.81);Muscle weakness (generalized) (M62.81);Difficulty in walking, not elsewhere classified (R26.2);Pain Pain - Right/Left: Right Pain - part of body: Hip     Time: 1015-1025 PT Time Calculation (min) (ACUTE ONLY): 10 min  Charges:    $Gait Training: 8-22 mins PT General Charges $$ ACUTE PT VISIT: 1 Visit                     Rankin Essex PTA 06/23/24, 12:34 PM

## 2024-09-28 ENCOUNTER — Ambulatory Visit

## 2024-10-06 ENCOUNTER — Ambulatory Visit

## 2024-10-12 ENCOUNTER — Ambulatory Visit

## 2024-10-14 ENCOUNTER — Ambulatory Visit

## 2024-10-20 ENCOUNTER — Ambulatory Visit

## 2024-10-22 ENCOUNTER — Ambulatory Visit

## 2024-10-26 ENCOUNTER — Ambulatory Visit

## 2024-10-29 ENCOUNTER — Ambulatory Visit

## 2024-11-02 ENCOUNTER — Ambulatory Visit

## 2024-11-05 ENCOUNTER — Ambulatory Visit

## 2024-11-09 ENCOUNTER — Ambulatory Visit

## 2024-11-12 ENCOUNTER — Ambulatory Visit

## 2024-11-19 ENCOUNTER — Ambulatory Visit: Admitting: Oncology

## 2024-11-19 ENCOUNTER — Other Ambulatory Visit

## 2025-02-04 ENCOUNTER — Ambulatory Visit (INDEPENDENT_AMBULATORY_CARE_PROVIDER_SITE_OTHER): Admitting: Vascular Surgery

## 2025-02-04 ENCOUNTER — Encounter (INDEPENDENT_AMBULATORY_CARE_PROVIDER_SITE_OTHER)
